# Patient Record
Sex: Female | Born: 1935 | Race: White | Hispanic: No | Smoking: Never smoker
Health system: Southern US, Community
[De-identification: ages and names within clinical notes are randomized; demographics above are authoritative.]

## PROBLEM LIST (undated history)

## (undated) DIAGNOSIS — I10 Essential (primary) hypertension: Secondary | ICD-10-CM

## (undated) DIAGNOSIS — M5430 Sciatica, unspecified side: Secondary | ICD-10-CM

## (undated) HISTORY — PX: ROTATOR CUFF REPAIR: SHX139

---

## 2015-02-06 DIAGNOSIS — Z7189 Other specified counseling: Secondary | ICD-10-CM | POA: Diagnosis not present

## 2015-02-06 DIAGNOSIS — E780 Pure hypercholesterolemia: Secondary | ICD-10-CM | POA: Diagnosis not present

## 2015-02-06 DIAGNOSIS — H9190 Unspecified hearing loss, unspecified ear: Secondary | ICD-10-CM | POA: Diagnosis not present

## 2015-02-06 DIAGNOSIS — N3281 Overactive bladder: Secondary | ICD-10-CM | POA: Diagnosis not present

## 2015-02-06 DIAGNOSIS — M1990 Unspecified osteoarthritis, unspecified site: Secondary | ICD-10-CM | POA: Diagnosis not present

## 2015-02-06 DIAGNOSIS — I10 Essential (primary) hypertension: Secondary | ICD-10-CM | POA: Diagnosis not present

## 2015-02-06 DIAGNOSIS — Z6824 Body mass index (BMI) 24.0-24.9, adult: Secondary | ICD-10-CM | POA: Diagnosis not present

## 2015-02-21 DIAGNOSIS — E039 Hypothyroidism, unspecified: Secondary | ICD-10-CM | POA: Diagnosis not present

## 2015-02-21 DIAGNOSIS — D649 Anemia, unspecified: Secondary | ICD-10-CM | POA: Diagnosis not present

## 2015-02-21 DIAGNOSIS — N390 Urinary tract infection, site not specified: Secondary | ICD-10-CM | POA: Diagnosis not present

## 2015-02-21 DIAGNOSIS — E780 Pure hypercholesterolemia: Secondary | ICD-10-CM | POA: Diagnosis not present

## 2015-03-01 DIAGNOSIS — I10 Essential (primary) hypertension: Secondary | ICD-10-CM | POA: Diagnosis not present

## 2015-03-01 DIAGNOSIS — M1990 Unspecified osteoarthritis, unspecified site: Secondary | ICD-10-CM | POA: Diagnosis not present

## 2015-03-01 DIAGNOSIS — Z6824 Body mass index (BMI) 24.0-24.9, adult: Secondary | ICD-10-CM | POA: Diagnosis not present

## 2015-03-01 DIAGNOSIS — J449 Chronic obstructive pulmonary disease, unspecified: Secondary | ICD-10-CM | POA: Diagnosis not present

## 2015-03-01 DIAGNOSIS — N3281 Overactive bladder: Secondary | ICD-10-CM | POA: Diagnosis not present

## 2015-03-01 DIAGNOSIS — H9190 Unspecified hearing loss, unspecified ear: Secondary | ICD-10-CM | POA: Diagnosis not present

## 2015-03-01 DIAGNOSIS — J45990 Exercise induced bronchospasm: Secondary | ICD-10-CM | POA: Diagnosis not present

## 2015-03-02 DIAGNOSIS — H04129 Dry eye syndrome of unspecified lacrimal gland: Secondary | ICD-10-CM | POA: Diagnosis not present

## 2015-03-02 DIAGNOSIS — Z961 Presence of intraocular lens: Secondary | ICD-10-CM | POA: Diagnosis not present

## 2015-03-02 DIAGNOSIS — H43819 Vitreous degeneration, unspecified eye: Secondary | ICD-10-CM | POA: Diagnosis not present

## 2015-05-31 DIAGNOSIS — N3281 Overactive bladder: Secondary | ICD-10-CM | POA: Diagnosis not present

## 2015-05-31 DIAGNOSIS — I10 Essential (primary) hypertension: Secondary | ICD-10-CM | POA: Diagnosis not present

## 2015-05-31 DIAGNOSIS — E780 Pure hypercholesterolemia: Secondary | ICD-10-CM | POA: Diagnosis not present

## 2015-05-31 DIAGNOSIS — M1990 Unspecified osteoarthritis, unspecified site: Secondary | ICD-10-CM | POA: Diagnosis not present

## 2015-05-31 DIAGNOSIS — Z681 Body mass index (BMI) 19 or less, adult: Secondary | ICD-10-CM | POA: Diagnosis not present

## 2015-06-27 DIAGNOSIS — Z85828 Personal history of other malignant neoplasm of skin: Secondary | ICD-10-CM | POA: Diagnosis not present

## 2015-06-27 DIAGNOSIS — L579 Skin changes due to chronic exposure to nonionizing radiation, unspecified: Secondary | ICD-10-CM | POA: Diagnosis not present

## 2015-06-28 DIAGNOSIS — M1990 Unspecified osteoarthritis, unspecified site: Secondary | ICD-10-CM | POA: Diagnosis not present

## 2015-06-28 DIAGNOSIS — M5416 Radiculopathy, lumbar region: Secondary | ICD-10-CM | POA: Diagnosis not present

## 2015-06-28 DIAGNOSIS — N3281 Overactive bladder: Secondary | ICD-10-CM | POA: Diagnosis not present

## 2015-06-28 DIAGNOSIS — H9190 Unspecified hearing loss, unspecified ear: Secondary | ICD-10-CM | POA: Diagnosis not present

## 2015-06-28 DIAGNOSIS — Z6824 Body mass index (BMI) 24.0-24.9, adult: Secondary | ICD-10-CM | POA: Diagnosis not present

## 2015-06-28 DIAGNOSIS — I10 Essential (primary) hypertension: Secondary | ICD-10-CM | POA: Diagnosis not present

## 2015-06-28 DIAGNOSIS — E780 Pure hypercholesterolemia: Secondary | ICD-10-CM | POA: Diagnosis not present

## 2015-06-28 DIAGNOSIS — J449 Chronic obstructive pulmonary disease, unspecified: Secondary | ICD-10-CM | POA: Diagnosis not present

## 2015-09-06 DIAGNOSIS — H43819 Vitreous degeneration, unspecified eye: Secondary | ICD-10-CM | POA: Diagnosis not present

## 2015-09-06 DIAGNOSIS — H3531 Nonexudative age-related macular degeneration: Secondary | ICD-10-CM | POA: Diagnosis not present

## 2015-09-06 DIAGNOSIS — H02403 Unspecified ptosis of bilateral eyelids: Secondary | ICD-10-CM | POA: Diagnosis not present

## 2015-09-06 DIAGNOSIS — H04202 Unspecified epiphora, left lacrimal gland: Secondary | ICD-10-CM | POA: Diagnosis not present

## 2015-10-06 DIAGNOSIS — K5900 Constipation, unspecified: Secondary | ICD-10-CM | POA: Diagnosis not present

## 2015-10-11 DIAGNOSIS — H353130 Nonexudative age-related macular degeneration, bilateral, stage unspecified: Secondary | ICD-10-CM | POA: Diagnosis not present

## 2015-10-11 DIAGNOSIS — H04123 Dry eye syndrome of bilateral lacrimal glands: Secondary | ICD-10-CM | POA: Diagnosis not present

## 2015-10-11 DIAGNOSIS — H43813 Vitreous degeneration, bilateral: Secondary | ICD-10-CM | POA: Diagnosis not present

## 2015-10-11 DIAGNOSIS — H10503 Unspecified blepharoconjunctivitis, bilateral: Secondary | ICD-10-CM | POA: Diagnosis not present

## 2015-10-16 DIAGNOSIS — R42 Dizziness and giddiness: Secondary | ICD-10-CM | POA: Diagnosis not present

## 2015-10-26 DIAGNOSIS — H0011 Chalazion right upper eyelid: Secondary | ICD-10-CM | POA: Diagnosis not present

## 2015-10-26 DIAGNOSIS — H1851 Endothelial corneal dystrophy: Secondary | ICD-10-CM | POA: Diagnosis not present

## 2015-10-26 DIAGNOSIS — H10503 Unspecified blepharoconjunctivitis, bilateral: Secondary | ICD-10-CM | POA: Diagnosis not present

## 2015-10-26 DIAGNOSIS — H34211 Partial retinal artery occlusion, right eye: Secondary | ICD-10-CM | POA: Diagnosis not present

## 2015-11-01 DIAGNOSIS — I10 Essential (primary) hypertension: Secondary | ICD-10-CM | POA: Diagnosis not present

## 2015-11-01 DIAGNOSIS — Z6824 Body mass index (BMI) 24.0-24.9, adult: Secondary | ICD-10-CM | POA: Diagnosis not present

## 2015-11-01 DIAGNOSIS — H9190 Unspecified hearing loss, unspecified ear: Secondary | ICD-10-CM | POA: Diagnosis not present

## 2015-11-01 DIAGNOSIS — M5416 Radiculopathy, lumbar region: Secondary | ICD-10-CM | POA: Diagnosis not present

## 2015-11-01 DIAGNOSIS — N3281 Overactive bladder: Secondary | ICD-10-CM | POA: Diagnosis not present

## 2015-11-01 DIAGNOSIS — Z23 Encounter for immunization: Secondary | ICD-10-CM | POA: Diagnosis not present

## 2015-11-01 DIAGNOSIS — E7800 Pure hypercholesterolemia, unspecified: Secondary | ICD-10-CM | POA: Diagnosis not present

## 2015-11-01 DIAGNOSIS — J449 Chronic obstructive pulmonary disease, unspecified: Secondary | ICD-10-CM | POA: Diagnosis not present

## 2015-11-01 DIAGNOSIS — M1990 Unspecified osteoarthritis, unspecified site: Secondary | ICD-10-CM | POA: Diagnosis not present

## 2015-11-13 DIAGNOSIS — H0011 Chalazion right upper eyelid: Secondary | ICD-10-CM | POA: Diagnosis not present

## 2015-12-22 DIAGNOSIS — E7800 Pure hypercholesterolemia, unspecified: Secondary | ICD-10-CM | POA: Diagnosis not present

## 2015-12-22 DIAGNOSIS — H9190 Unspecified hearing loss, unspecified ear: Secondary | ICD-10-CM | POA: Diagnosis not present

## 2015-12-22 DIAGNOSIS — N3281 Overactive bladder: Secondary | ICD-10-CM | POA: Diagnosis not present

## 2015-12-22 DIAGNOSIS — Z6824 Body mass index (BMI) 24.0-24.9, adult: Secondary | ICD-10-CM | POA: Diagnosis not present

## 2015-12-22 DIAGNOSIS — J449 Chronic obstructive pulmonary disease, unspecified: Secondary | ICD-10-CM | POA: Diagnosis not present

## 2015-12-22 DIAGNOSIS — M5416 Radiculopathy, lumbar region: Secondary | ICD-10-CM | POA: Diagnosis not present

## 2015-12-22 DIAGNOSIS — I10 Essential (primary) hypertension: Secondary | ICD-10-CM | POA: Diagnosis not present

## 2015-12-22 DIAGNOSIS — M1990 Unspecified osteoarthritis, unspecified site: Secondary | ICD-10-CM | POA: Diagnosis not present

## 2015-12-29 DIAGNOSIS — M1990 Unspecified osteoarthritis, unspecified site: Secondary | ICD-10-CM | POA: Diagnosis not present

## 2015-12-29 DIAGNOSIS — N3281 Overactive bladder: Secondary | ICD-10-CM | POA: Diagnosis not present

## 2015-12-29 DIAGNOSIS — Z6824 Body mass index (BMI) 24.0-24.9, adult: Secondary | ICD-10-CM | POA: Diagnosis not present

## 2015-12-29 DIAGNOSIS — E7800 Pure hypercholesterolemia, unspecified: Secondary | ICD-10-CM | POA: Diagnosis not present

## 2015-12-29 DIAGNOSIS — I10 Essential (primary) hypertension: Secondary | ICD-10-CM | POA: Diagnosis not present

## 2015-12-29 DIAGNOSIS — H9190 Unspecified hearing loss, unspecified ear: Secondary | ICD-10-CM | POA: Diagnosis not present

## 2015-12-29 DIAGNOSIS — J449 Chronic obstructive pulmonary disease, unspecified: Secondary | ICD-10-CM | POA: Diagnosis not present

## 2015-12-29 DIAGNOSIS — M5416 Radiculopathy, lumbar region: Secondary | ICD-10-CM | POA: Diagnosis not present

## 2016-01-01 DIAGNOSIS — M47896 Other spondylosis, lumbar region: Secondary | ICD-10-CM | POA: Diagnosis not present

## 2016-01-01 DIAGNOSIS — M545 Low back pain: Secondary | ICD-10-CM | POA: Diagnosis not present

## 2016-01-07 DIAGNOSIS — K5730 Diverticulosis of large intestine without perforation or abscess without bleeding: Secondary | ICD-10-CM | POA: Diagnosis not present

## 2016-01-07 DIAGNOSIS — K922 Gastrointestinal hemorrhage, unspecified: Secondary | ICD-10-CM | POA: Diagnosis not present

## 2016-01-07 DIAGNOSIS — K625 Hemorrhage of anus and rectum: Secondary | ICD-10-CM | POA: Diagnosis not present

## 2016-01-07 DIAGNOSIS — R030 Elevated blood-pressure reading, without diagnosis of hypertension: Secondary | ICD-10-CM | POA: Diagnosis not present

## 2016-01-08 DIAGNOSIS — K922 Gastrointestinal hemorrhage, unspecified: Secondary | ICD-10-CM | POA: Diagnosis not present

## 2016-01-08 DIAGNOSIS — K625 Hemorrhage of anus and rectum: Secondary | ICD-10-CM | POA: Diagnosis not present

## 2016-01-08 DIAGNOSIS — D62 Acute posthemorrhagic anemia: Secondary | ICD-10-CM | POA: Diagnosis not present

## 2016-01-08 DIAGNOSIS — M4646 Discitis, unspecified, lumbar region: Secondary | ICD-10-CM | POA: Diagnosis not present

## 2016-01-08 DIAGNOSIS — R52 Pain, unspecified: Secondary | ICD-10-CM | POA: Diagnosis not present

## 2016-01-09 DIAGNOSIS — M4646 Discitis, unspecified, lumbar region: Secondary | ICD-10-CM | POA: Diagnosis not present

## 2016-01-09 DIAGNOSIS — D62 Acute posthemorrhagic anemia: Secondary | ICD-10-CM | POA: Diagnosis not present

## 2016-01-09 DIAGNOSIS — R52 Pain, unspecified: Secondary | ICD-10-CM | POA: Diagnosis not present

## 2016-01-09 DIAGNOSIS — D126 Benign neoplasm of colon, unspecified: Secondary | ICD-10-CM | POA: Diagnosis not present

## 2016-01-09 DIAGNOSIS — K5790 Diverticulosis of intestine, part unspecified, without perforation or abscess without bleeding: Secondary | ICD-10-CM | POA: Diagnosis not present

## 2016-01-09 DIAGNOSIS — K625 Hemorrhage of anus and rectum: Secondary | ICD-10-CM | POA: Diagnosis not present

## 2016-01-09 DIAGNOSIS — K922 Gastrointestinal hemorrhage, unspecified: Secondary | ICD-10-CM | POA: Diagnosis not present

## 2016-01-10 DIAGNOSIS — K625 Hemorrhage of anus and rectum: Secondary | ICD-10-CM | POA: Diagnosis not present

## 2016-01-10 DIAGNOSIS — R52 Pain, unspecified: Secondary | ICD-10-CM | POA: Diagnosis not present

## 2016-01-10 DIAGNOSIS — D62 Acute posthemorrhagic anemia: Secondary | ICD-10-CM | POA: Diagnosis not present

## 2016-01-10 DIAGNOSIS — M4806 Spinal stenosis, lumbar region: Secondary | ICD-10-CM | POA: Diagnosis not present

## 2016-01-10 DIAGNOSIS — D126 Benign neoplasm of colon, unspecified: Secondary | ICD-10-CM | POA: Diagnosis not present

## 2016-01-10 DIAGNOSIS — K922 Gastrointestinal hemorrhage, unspecified: Secondary | ICD-10-CM | POA: Diagnosis not present

## 2016-01-10 DIAGNOSIS — M5126 Other intervertebral disc displacement, lumbar region: Secondary | ICD-10-CM | POA: Diagnosis not present

## 2016-01-10 DIAGNOSIS — M47816 Spondylosis without myelopathy or radiculopathy, lumbar region: Secondary | ICD-10-CM | POA: Diagnosis not present

## 2016-01-10 DIAGNOSIS — M4646 Discitis, unspecified, lumbar region: Secondary | ICD-10-CM | POA: Diagnosis not present

## 2016-01-10 DIAGNOSIS — K5790 Diverticulosis of intestine, part unspecified, without perforation or abscess without bleeding: Secondary | ICD-10-CM | POA: Diagnosis not present

## 2016-01-11 DIAGNOSIS — R52 Pain, unspecified: Secondary | ICD-10-CM | POA: Diagnosis not present

## 2016-01-11 DIAGNOSIS — K5790 Diverticulosis of intestine, part unspecified, without perforation or abscess without bleeding: Secondary | ICD-10-CM | POA: Diagnosis not present

## 2016-01-11 DIAGNOSIS — D126 Benign neoplasm of colon, unspecified: Secondary | ICD-10-CM | POA: Diagnosis not present

## 2016-01-11 DIAGNOSIS — M4646 Discitis, unspecified, lumbar region: Secondary | ICD-10-CM | POA: Diagnosis not present

## 2016-01-11 DIAGNOSIS — K922 Gastrointestinal hemorrhage, unspecified: Secondary | ICD-10-CM | POA: Diagnosis not present

## 2016-01-11 DIAGNOSIS — D62 Acute posthemorrhagic anemia: Secondary | ICD-10-CM | POA: Diagnosis not present

## 2016-01-11 DIAGNOSIS — K625 Hemorrhage of anus and rectum: Secondary | ICD-10-CM | POA: Diagnosis not present

## 2016-01-12 DIAGNOSIS — R52 Pain, unspecified: Secondary | ICD-10-CM | POA: Diagnosis not present

## 2016-01-12 DIAGNOSIS — K922 Gastrointestinal hemorrhage, unspecified: Secondary | ICD-10-CM | POA: Diagnosis not present

## 2016-01-12 DIAGNOSIS — D62 Acute posthemorrhagic anemia: Secondary | ICD-10-CM | POA: Diagnosis not present

## 2016-01-12 DIAGNOSIS — K625 Hemorrhage of anus and rectum: Secondary | ICD-10-CM | POA: Diagnosis not present

## 2016-01-12 DIAGNOSIS — D126 Benign neoplasm of colon, unspecified: Secondary | ICD-10-CM | POA: Diagnosis not present

## 2016-01-12 DIAGNOSIS — K5790 Diverticulosis of intestine, part unspecified, without perforation or abscess without bleeding: Secondary | ICD-10-CM | POA: Diagnosis not present

## 2016-01-12 DIAGNOSIS — M4646 Discitis, unspecified, lumbar region: Secondary | ICD-10-CM | POA: Diagnosis not present

## 2016-01-12 DIAGNOSIS — K5100 Ulcerative (chronic) pancolitis without complications: Secondary | ICD-10-CM | POA: Diagnosis not present

## 2016-01-13 DIAGNOSIS — M4646 Discitis, unspecified, lumbar region: Secondary | ICD-10-CM | POA: Diagnosis not present

## 2016-01-13 DIAGNOSIS — K922 Gastrointestinal hemorrhage, unspecified: Secondary | ICD-10-CM | POA: Diagnosis not present

## 2016-01-13 DIAGNOSIS — R52 Pain, unspecified: Secondary | ICD-10-CM | POA: Diagnosis not present

## 2016-01-13 DIAGNOSIS — D62 Acute posthemorrhagic anemia: Secondary | ICD-10-CM | POA: Diagnosis not present

## 2016-01-14 DIAGNOSIS — M4646 Discitis, unspecified, lumbar region: Secondary | ICD-10-CM | POA: Diagnosis not present

## 2016-01-14 DIAGNOSIS — D649 Anemia, unspecified: Secondary | ICD-10-CM | POA: Diagnosis not present

## 2016-01-14 DIAGNOSIS — M62512 Muscle wasting and atrophy, not elsewhere classified, left shoulder: Secondary | ICD-10-CM | POA: Diagnosis not present

## 2016-01-14 DIAGNOSIS — M8448XA Pathological fracture, other site, initial encounter for fracture: Secondary | ICD-10-CM | POA: Diagnosis not present

## 2016-01-14 DIAGNOSIS — Z741 Need for assistance with personal care: Secondary | ICD-10-CM | POA: Diagnosis not present

## 2016-01-14 DIAGNOSIS — M869 Osteomyelitis, unspecified: Secondary | ICD-10-CM | POA: Diagnosis not present

## 2016-01-14 DIAGNOSIS — M62541 Muscle wasting and atrophy, not elsewhere classified, right hand: Secondary | ICD-10-CM | POA: Diagnosis not present

## 2016-01-14 DIAGNOSIS — M4620 Osteomyelitis of vertebra, site unspecified: Secondary | ICD-10-CM | POA: Diagnosis not present

## 2016-01-14 DIAGNOSIS — M479 Spondylosis, unspecified: Secondary | ICD-10-CM | POA: Diagnosis not present

## 2016-01-14 DIAGNOSIS — M62522 Muscle wasting and atrophy, not elsewhere classified, left upper arm: Secondary | ICD-10-CM | POA: Diagnosis not present

## 2016-01-14 DIAGNOSIS — S32059A Unspecified fracture of fifth lumbar vertebra, initial encounter for closed fracture: Secondary | ICD-10-CM | POA: Diagnosis not present

## 2016-01-14 DIAGNOSIS — M545 Low back pain: Secondary | ICD-10-CM | POA: Diagnosis not present

## 2016-01-14 DIAGNOSIS — K219 Gastro-esophageal reflux disease without esophagitis: Secondary | ICD-10-CM | POA: Diagnosis not present

## 2016-01-14 DIAGNOSIS — M62542 Muscle wasting and atrophy, not elsewhere classified, left hand: Secondary | ICD-10-CM | POA: Diagnosis not present

## 2016-01-14 DIAGNOSIS — M62531 Muscle wasting and atrophy, not elsewhere classified, right forearm: Secondary | ICD-10-CM | POA: Diagnosis not present

## 2016-01-14 DIAGNOSIS — M62521 Muscle wasting and atrophy, not elsewhere classified, right upper arm: Secondary | ICD-10-CM | POA: Diagnosis not present

## 2016-01-14 DIAGNOSIS — M62511 Muscle wasting and atrophy, not elsewhere classified, right shoulder: Secondary | ICD-10-CM | POA: Diagnosis not present

## 2016-01-14 DIAGNOSIS — K922 Gastrointestinal hemorrhage, unspecified: Secondary | ICD-10-CM | POA: Diagnosis not present

## 2016-01-14 DIAGNOSIS — M62532 Muscle wasting and atrophy, not elsewhere classified, left forearm: Secondary | ICD-10-CM | POA: Diagnosis not present

## 2016-01-14 DIAGNOSIS — M1990 Unspecified osteoarthritis, unspecified site: Secondary | ICD-10-CM | POA: Diagnosis not present

## 2016-01-14 DIAGNOSIS — M5416 Radiculopathy, lumbar region: Secondary | ICD-10-CM | POA: Diagnosis not present

## 2016-01-14 DIAGNOSIS — D62 Acute posthemorrhagic anemia: Secondary | ICD-10-CM | POA: Diagnosis not present

## 2016-01-14 DIAGNOSIS — R531 Weakness: Secondary | ICD-10-CM | POA: Diagnosis not present

## 2016-01-14 DIAGNOSIS — R2681 Unsteadiness on feet: Secondary | ICD-10-CM | POA: Diagnosis not present

## 2016-01-14 DIAGNOSIS — R52 Pain, unspecified: Secondary | ICD-10-CM | POA: Diagnosis not present

## 2016-01-14 DIAGNOSIS — R262 Difficulty in walking, not elsewhere classified: Secondary | ICD-10-CM | POA: Diagnosis not present

## 2016-01-14 DIAGNOSIS — I10 Essential (primary) hypertension: Secondary | ICD-10-CM | POA: Diagnosis not present

## 2016-01-15 DIAGNOSIS — M869 Osteomyelitis, unspecified: Secondary | ICD-10-CM | POA: Diagnosis not present

## 2016-01-15 DIAGNOSIS — R2681 Unsteadiness on feet: Secondary | ICD-10-CM | POA: Diagnosis not present

## 2016-01-15 DIAGNOSIS — R531 Weakness: Secondary | ICD-10-CM | POA: Diagnosis not present

## 2016-01-15 DIAGNOSIS — K219 Gastro-esophageal reflux disease without esophagitis: Secondary | ICD-10-CM | POA: Diagnosis not present

## 2016-01-19 DIAGNOSIS — K922 Gastrointestinal hemorrhage, unspecified: Secondary | ICD-10-CM | POA: Diagnosis not present

## 2016-01-19 DIAGNOSIS — D649 Anemia, unspecified: Secondary | ICD-10-CM | POA: Diagnosis not present

## 2016-01-19 DIAGNOSIS — M1990 Unspecified osteoarthritis, unspecified site: Secondary | ICD-10-CM | POA: Diagnosis not present

## 2016-01-19 DIAGNOSIS — I10 Essential (primary) hypertension: Secondary | ICD-10-CM | POA: Diagnosis not present

## 2016-02-06 DIAGNOSIS — M545 Low back pain: Secondary | ICD-10-CM | POA: Diagnosis not present

## 2016-02-06 DIAGNOSIS — D649 Anemia, unspecified: Secondary | ICD-10-CM | POA: Diagnosis not present

## 2016-02-06 DIAGNOSIS — M4620 Osteomyelitis of vertebra, site unspecified: Secondary | ICD-10-CM | POA: Diagnosis not present

## 2016-02-06 DIAGNOSIS — M1990 Unspecified osteoarthritis, unspecified site: Secondary | ICD-10-CM | POA: Diagnosis not present

## 2016-02-06 DIAGNOSIS — M8448XA Pathological fracture, other site, initial encounter for fracture: Secondary | ICD-10-CM | POA: Diagnosis not present

## 2016-02-06 DIAGNOSIS — K922 Gastrointestinal hemorrhage, unspecified: Secondary | ICD-10-CM | POA: Diagnosis not present

## 2016-02-06 DIAGNOSIS — M5416 Radiculopathy, lumbar region: Secondary | ICD-10-CM | POA: Diagnosis not present

## 2016-02-06 DIAGNOSIS — I10 Essential (primary) hypertension: Secondary | ICD-10-CM | POA: Diagnosis not present

## 2016-02-08 DIAGNOSIS — M479 Spondylosis, unspecified: Secondary | ICD-10-CM | POA: Diagnosis not present

## 2016-02-08 DIAGNOSIS — R2681 Unsteadiness on feet: Secondary | ICD-10-CM | POA: Diagnosis not present

## 2016-02-08 DIAGNOSIS — S32059A Unspecified fracture of fifth lumbar vertebra, initial encounter for closed fracture: Secondary | ICD-10-CM | POA: Diagnosis not present

## 2016-02-08 DIAGNOSIS — R531 Weakness: Secondary | ICD-10-CM | POA: Diagnosis not present

## 2016-02-13 DIAGNOSIS — S32059A Unspecified fracture of fifth lumbar vertebra, initial encounter for closed fracture: Secondary | ICD-10-CM | POA: Diagnosis not present

## 2016-02-13 DIAGNOSIS — M479 Spondylosis, unspecified: Secondary | ICD-10-CM | POA: Diagnosis not present

## 2016-02-13 DIAGNOSIS — R531 Weakness: Secondary | ICD-10-CM | POA: Diagnosis not present

## 2016-02-14 DIAGNOSIS — R2681 Unsteadiness on feet: Secondary | ICD-10-CM | POA: Diagnosis not present

## 2016-02-14 DIAGNOSIS — M419 Scoliosis, unspecified: Secondary | ICD-10-CM | POA: Diagnosis not present

## 2016-02-14 DIAGNOSIS — G8929 Other chronic pain: Secondary | ICD-10-CM | POA: Diagnosis not present

## 2016-02-14 DIAGNOSIS — D62 Acute posthemorrhagic anemia: Secondary | ICD-10-CM | POA: Diagnosis not present

## 2016-02-14 DIAGNOSIS — S32050D Wedge compression fracture of fifth lumbar vertebra, subsequent encounter for fracture with routine healing: Secondary | ICD-10-CM | POA: Diagnosis not present

## 2016-02-14 DIAGNOSIS — M549 Dorsalgia, unspecified: Secondary | ICD-10-CM | POA: Diagnosis not present

## 2016-02-20 DIAGNOSIS — M549 Dorsalgia, unspecified: Secondary | ICD-10-CM | POA: Diagnosis not present

## 2016-02-20 DIAGNOSIS — S32050D Wedge compression fracture of fifth lumbar vertebra, subsequent encounter for fracture with routine healing: Secondary | ICD-10-CM | POA: Diagnosis not present

## 2016-02-20 DIAGNOSIS — R2681 Unsteadiness on feet: Secondary | ICD-10-CM | POA: Diagnosis not present

## 2016-02-20 DIAGNOSIS — M419 Scoliosis, unspecified: Secondary | ICD-10-CM | POA: Diagnosis not present

## 2016-02-20 DIAGNOSIS — G8929 Other chronic pain: Secondary | ICD-10-CM | POA: Diagnosis not present

## 2016-02-20 DIAGNOSIS — D62 Acute posthemorrhagic anemia: Secondary | ICD-10-CM | POA: Diagnosis not present

## 2016-02-21 DIAGNOSIS — D62 Acute posthemorrhagic anemia: Secondary | ICD-10-CM | POA: Diagnosis not present

## 2016-02-21 DIAGNOSIS — R2681 Unsteadiness on feet: Secondary | ICD-10-CM | POA: Diagnosis not present

## 2016-02-21 DIAGNOSIS — M419 Scoliosis, unspecified: Secondary | ICD-10-CM | POA: Diagnosis not present

## 2016-02-21 DIAGNOSIS — G8929 Other chronic pain: Secondary | ICD-10-CM | POA: Diagnosis not present

## 2016-02-21 DIAGNOSIS — S32050D Wedge compression fracture of fifth lumbar vertebra, subsequent encounter for fracture with routine healing: Secondary | ICD-10-CM | POA: Diagnosis not present

## 2016-02-21 DIAGNOSIS — M549 Dorsalgia, unspecified: Secondary | ICD-10-CM | POA: Diagnosis not present

## 2016-02-22 DIAGNOSIS — G8929 Other chronic pain: Secondary | ICD-10-CM | POA: Diagnosis not present

## 2016-02-22 DIAGNOSIS — M549 Dorsalgia, unspecified: Secondary | ICD-10-CM | POA: Diagnosis not present

## 2016-02-22 DIAGNOSIS — D62 Acute posthemorrhagic anemia: Secondary | ICD-10-CM | POA: Diagnosis not present

## 2016-02-22 DIAGNOSIS — R2681 Unsteadiness on feet: Secondary | ICD-10-CM | POA: Diagnosis not present

## 2016-02-22 DIAGNOSIS — S32050D Wedge compression fracture of fifth lumbar vertebra, subsequent encounter for fracture with routine healing: Secondary | ICD-10-CM | POA: Diagnosis not present

## 2016-02-22 DIAGNOSIS — M419 Scoliosis, unspecified: Secondary | ICD-10-CM | POA: Diagnosis not present

## 2016-02-23 DIAGNOSIS — R2681 Unsteadiness on feet: Secondary | ICD-10-CM | POA: Diagnosis not present

## 2016-02-23 DIAGNOSIS — G8929 Other chronic pain: Secondary | ICD-10-CM | POA: Diagnosis not present

## 2016-02-23 DIAGNOSIS — M549 Dorsalgia, unspecified: Secondary | ICD-10-CM | POA: Diagnosis not present

## 2016-02-23 DIAGNOSIS — S32050D Wedge compression fracture of fifth lumbar vertebra, subsequent encounter for fracture with routine healing: Secondary | ICD-10-CM | POA: Diagnosis not present

## 2016-02-23 DIAGNOSIS — M419 Scoliosis, unspecified: Secondary | ICD-10-CM | POA: Diagnosis not present

## 2016-02-23 DIAGNOSIS — D62 Acute posthemorrhagic anemia: Secondary | ICD-10-CM | POA: Diagnosis not present

## 2016-02-27 DIAGNOSIS — E7800 Pure hypercholesterolemia, unspecified: Secondary | ICD-10-CM | POA: Diagnosis not present

## 2016-02-27 DIAGNOSIS — D649 Anemia, unspecified: Secondary | ICD-10-CM | POA: Diagnosis not present

## 2016-02-27 DIAGNOSIS — D62 Acute posthemorrhagic anemia: Secondary | ICD-10-CM | POA: Diagnosis not present

## 2016-02-27 DIAGNOSIS — R42 Dizziness and giddiness: Secondary | ICD-10-CM | POA: Diagnosis not present

## 2016-02-27 DIAGNOSIS — E039 Hypothyroidism, unspecified: Secondary | ICD-10-CM | POA: Diagnosis not present

## 2016-02-27 DIAGNOSIS — M419 Scoliosis, unspecified: Secondary | ICD-10-CM | POA: Diagnosis not present

## 2016-02-27 DIAGNOSIS — I10 Essential (primary) hypertension: Secondary | ICD-10-CM | POA: Diagnosis not present

## 2016-02-27 DIAGNOSIS — N289 Disorder of kidney and ureter, unspecified: Secondary | ICD-10-CM | POA: Diagnosis not present

## 2016-02-27 DIAGNOSIS — S32050D Wedge compression fracture of fifth lumbar vertebra, subsequent encounter for fracture with routine healing: Secondary | ICD-10-CM | POA: Diagnosis not present

## 2016-02-27 DIAGNOSIS — G8929 Other chronic pain: Secondary | ICD-10-CM | POA: Diagnosis not present

## 2016-02-27 DIAGNOSIS — M549 Dorsalgia, unspecified: Secondary | ICD-10-CM | POA: Diagnosis not present

## 2016-02-27 DIAGNOSIS — R2681 Unsteadiness on feet: Secondary | ICD-10-CM | POA: Diagnosis not present

## 2016-02-28 DIAGNOSIS — M419 Scoliosis, unspecified: Secondary | ICD-10-CM | POA: Diagnosis not present

## 2016-02-28 DIAGNOSIS — G8929 Other chronic pain: Secondary | ICD-10-CM | POA: Diagnosis not present

## 2016-02-28 DIAGNOSIS — S32050D Wedge compression fracture of fifth lumbar vertebra, subsequent encounter for fracture with routine healing: Secondary | ICD-10-CM | POA: Diagnosis not present

## 2016-02-28 DIAGNOSIS — M549 Dorsalgia, unspecified: Secondary | ICD-10-CM | POA: Diagnosis not present

## 2016-02-28 DIAGNOSIS — D62 Acute posthemorrhagic anemia: Secondary | ICD-10-CM | POA: Diagnosis not present

## 2016-02-28 DIAGNOSIS — R2681 Unsteadiness on feet: Secondary | ICD-10-CM | POA: Diagnosis not present

## 2016-02-29 DIAGNOSIS — G8929 Other chronic pain: Secondary | ICD-10-CM | POA: Diagnosis not present

## 2016-02-29 DIAGNOSIS — R2681 Unsteadiness on feet: Secondary | ICD-10-CM | POA: Diagnosis not present

## 2016-02-29 DIAGNOSIS — D62 Acute posthemorrhagic anemia: Secondary | ICD-10-CM | POA: Diagnosis not present

## 2016-02-29 DIAGNOSIS — M549 Dorsalgia, unspecified: Secondary | ICD-10-CM | POA: Diagnosis not present

## 2016-02-29 DIAGNOSIS — S32050D Wedge compression fracture of fifth lumbar vertebra, subsequent encounter for fracture with routine healing: Secondary | ICD-10-CM | POA: Diagnosis not present

## 2016-02-29 DIAGNOSIS — M419 Scoliosis, unspecified: Secondary | ICD-10-CM | POA: Diagnosis not present

## 2016-03-01 DIAGNOSIS — R2681 Unsteadiness on feet: Secondary | ICD-10-CM | POA: Diagnosis not present

## 2016-03-01 DIAGNOSIS — G8929 Other chronic pain: Secondary | ICD-10-CM | POA: Diagnosis not present

## 2016-03-01 DIAGNOSIS — M549 Dorsalgia, unspecified: Secondary | ICD-10-CM | POA: Diagnosis not present

## 2016-03-01 DIAGNOSIS — M419 Scoliosis, unspecified: Secondary | ICD-10-CM | POA: Diagnosis not present

## 2016-03-01 DIAGNOSIS — D62 Acute posthemorrhagic anemia: Secondary | ICD-10-CM | POA: Diagnosis not present

## 2016-03-01 DIAGNOSIS — S32050D Wedge compression fracture of fifth lumbar vertebra, subsequent encounter for fracture with routine healing: Secondary | ICD-10-CM | POA: Diagnosis not present

## 2016-03-04 DIAGNOSIS — I10 Essential (primary) hypertension: Secondary | ICD-10-CM | POA: Diagnosis not present

## 2016-03-04 DIAGNOSIS — M329 Systemic lupus erythematosus, unspecified: Secondary | ICD-10-CM | POA: Diagnosis not present

## 2016-03-04 DIAGNOSIS — M1990 Unspecified osteoarthritis, unspecified site: Secondary | ICD-10-CM | POA: Diagnosis not present

## 2016-03-04 DIAGNOSIS — N3281 Overactive bladder: Secondary | ICD-10-CM | POA: Diagnosis not present

## 2016-03-04 DIAGNOSIS — M5416 Radiculopathy, lumbar region: Secondary | ICD-10-CM | POA: Diagnosis not present

## 2016-03-04 DIAGNOSIS — J449 Chronic obstructive pulmonary disease, unspecified: Secondary | ICD-10-CM | POA: Diagnosis not present

## 2016-03-04 DIAGNOSIS — E7800 Pure hypercholesterolemia, unspecified: Secondary | ICD-10-CM | POA: Diagnosis not present

## 2016-03-04 DIAGNOSIS — K769 Liver disease, unspecified: Secondary | ICD-10-CM | POA: Diagnosis not present

## 2016-03-04 DIAGNOSIS — Z6823 Body mass index (BMI) 23.0-23.9, adult: Secondary | ICD-10-CM | POA: Diagnosis not present

## 2016-03-04 DIAGNOSIS — H9190 Unspecified hearing loss, unspecified ear: Secondary | ICD-10-CM | POA: Diagnosis not present

## 2016-03-05 DIAGNOSIS — M549 Dorsalgia, unspecified: Secondary | ICD-10-CM | POA: Diagnosis not present

## 2016-03-05 DIAGNOSIS — S32050D Wedge compression fracture of fifth lumbar vertebra, subsequent encounter for fracture with routine healing: Secondary | ICD-10-CM | POA: Diagnosis not present

## 2016-03-05 DIAGNOSIS — D62 Acute posthemorrhagic anemia: Secondary | ICD-10-CM | POA: Diagnosis not present

## 2016-03-05 DIAGNOSIS — G8929 Other chronic pain: Secondary | ICD-10-CM | POA: Diagnosis not present

## 2016-03-05 DIAGNOSIS — M419 Scoliosis, unspecified: Secondary | ICD-10-CM | POA: Diagnosis not present

## 2016-03-05 DIAGNOSIS — R2681 Unsteadiness on feet: Secondary | ICD-10-CM | POA: Diagnosis not present

## 2016-03-06 DIAGNOSIS — M9953 Intervertebral disc stenosis of neural canal of lumbar region: Secondary | ICD-10-CM | POA: Diagnosis not present

## 2016-03-06 DIAGNOSIS — M5416 Radiculopathy, lumbar region: Secondary | ICD-10-CM | POA: Diagnosis not present

## 2016-03-06 DIAGNOSIS — M9983 Other biomechanical lesions of lumbar region: Secondary | ICD-10-CM | POA: Diagnosis not present

## 2016-03-07 DIAGNOSIS — M419 Scoliosis, unspecified: Secondary | ICD-10-CM | POA: Diagnosis not present

## 2016-03-07 DIAGNOSIS — G8929 Other chronic pain: Secondary | ICD-10-CM | POA: Diagnosis not present

## 2016-03-07 DIAGNOSIS — S32050D Wedge compression fracture of fifth lumbar vertebra, subsequent encounter for fracture with routine healing: Secondary | ICD-10-CM | POA: Diagnosis not present

## 2016-03-07 DIAGNOSIS — R2681 Unsteadiness on feet: Secondary | ICD-10-CM | POA: Diagnosis not present

## 2016-03-07 DIAGNOSIS — M549 Dorsalgia, unspecified: Secondary | ICD-10-CM | POA: Diagnosis not present

## 2016-03-07 DIAGNOSIS — D62 Acute posthemorrhagic anemia: Secondary | ICD-10-CM | POA: Diagnosis not present

## 2016-03-08 DIAGNOSIS — S32050D Wedge compression fracture of fifth lumbar vertebra, subsequent encounter for fracture with routine healing: Secondary | ICD-10-CM | POA: Diagnosis not present

## 2016-03-08 DIAGNOSIS — M1990 Unspecified osteoarthritis, unspecified site: Secondary | ICD-10-CM | POA: Diagnosis not present

## 2016-03-08 DIAGNOSIS — J449 Chronic obstructive pulmonary disease, unspecified: Secondary | ICD-10-CM | POA: Diagnosis not present

## 2016-03-08 DIAGNOSIS — E782 Mixed hyperlipidemia: Secondary | ICD-10-CM | POA: Diagnosis not present

## 2016-03-11 DIAGNOSIS — R2681 Unsteadiness on feet: Secondary | ICD-10-CM | POA: Diagnosis not present

## 2016-03-11 DIAGNOSIS — D62 Acute posthemorrhagic anemia: Secondary | ICD-10-CM | POA: Diagnosis not present

## 2016-03-11 DIAGNOSIS — S32050D Wedge compression fracture of fifth lumbar vertebra, subsequent encounter for fracture with routine healing: Secondary | ICD-10-CM | POA: Diagnosis not present

## 2016-03-11 DIAGNOSIS — M549 Dorsalgia, unspecified: Secondary | ICD-10-CM | POA: Diagnosis not present

## 2016-03-11 DIAGNOSIS — M419 Scoliosis, unspecified: Secondary | ICD-10-CM | POA: Diagnosis not present

## 2016-03-11 DIAGNOSIS — G8929 Other chronic pain: Secondary | ICD-10-CM | POA: Diagnosis not present

## 2016-03-13 DIAGNOSIS — M4806 Spinal stenosis, lumbar region: Secondary | ICD-10-CM | POA: Diagnosis not present

## 2016-03-13 DIAGNOSIS — M8448XA Pathological fracture, other site, initial encounter for fracture: Secondary | ICD-10-CM | POA: Diagnosis not present

## 2016-03-13 DIAGNOSIS — M5416 Radiculopathy, lumbar region: Secondary | ICD-10-CM | POA: Diagnosis not present

## 2016-03-14 DIAGNOSIS — M8468XA Pathological fracture in other disease, other site, initial encounter for fracture: Secondary | ICD-10-CM | POA: Diagnosis not present

## 2016-03-14 DIAGNOSIS — M4806 Spinal stenosis, lumbar region: Secondary | ICD-10-CM | POA: Diagnosis not present

## 2016-03-14 DIAGNOSIS — M5416 Radiculopathy, lumbar region: Secondary | ICD-10-CM | POA: Diagnosis not present

## 2016-03-14 DIAGNOSIS — M8448XA Pathological fracture, other site, initial encounter for fracture: Secondary | ICD-10-CM | POA: Diagnosis not present

## 2016-03-14 DIAGNOSIS — M8088XA Other osteoporosis with current pathological fracture, vertebra(e), initial encounter for fracture: Secondary | ICD-10-CM | POA: Diagnosis not present

## 2016-03-14 DIAGNOSIS — Z4789 Encounter for other orthopedic aftercare: Secondary | ICD-10-CM | POA: Diagnosis not present

## 2016-03-15 DIAGNOSIS — M4806 Spinal stenosis, lumbar region: Secondary | ICD-10-CM | POA: Diagnosis not present

## 2016-03-15 DIAGNOSIS — M8088XA Other osteoporosis with current pathological fracture, vertebra(e), initial encounter for fracture: Secondary | ICD-10-CM | POA: Diagnosis not present

## 2016-03-15 DIAGNOSIS — Z4789 Encounter for other orthopedic aftercare: Secondary | ICD-10-CM | POA: Diagnosis not present

## 2016-03-15 DIAGNOSIS — M5416 Radiculopathy, lumbar region: Secondary | ICD-10-CM | POA: Diagnosis not present

## 2016-03-18 DIAGNOSIS — M5416 Radiculopathy, lumbar region: Secondary | ICD-10-CM | POA: Diagnosis not present

## 2016-03-18 DIAGNOSIS — M4806 Spinal stenosis, lumbar region: Secondary | ICD-10-CM | POA: Diagnosis not present

## 2016-03-18 DIAGNOSIS — M8088XA Other osteoporosis with current pathological fracture, vertebra(e), initial encounter for fracture: Secondary | ICD-10-CM | POA: Diagnosis not present

## 2016-03-18 DIAGNOSIS — Z4789 Encounter for other orthopedic aftercare: Secondary | ICD-10-CM | POA: Diagnosis not present

## 2016-03-21 DIAGNOSIS — Z4789 Encounter for other orthopedic aftercare: Secondary | ICD-10-CM | POA: Diagnosis not present

## 2016-03-21 DIAGNOSIS — M4806 Spinal stenosis, lumbar region: Secondary | ICD-10-CM | POA: Diagnosis not present

## 2016-03-21 DIAGNOSIS — M5416 Radiculopathy, lumbar region: Secondary | ICD-10-CM | POA: Diagnosis not present

## 2016-03-21 DIAGNOSIS — M8088XA Other osteoporosis with current pathological fracture, vertebra(e), initial encounter for fracture: Secondary | ICD-10-CM | POA: Diagnosis not present

## 2016-03-27 DIAGNOSIS — M4806 Spinal stenosis, lumbar region: Secondary | ICD-10-CM | POA: Diagnosis not present

## 2016-03-27 DIAGNOSIS — Z4789 Encounter for other orthopedic aftercare: Secondary | ICD-10-CM | POA: Diagnosis not present

## 2016-03-27 DIAGNOSIS — M8088XA Other osteoporosis with current pathological fracture, vertebra(e), initial encounter for fracture: Secondary | ICD-10-CM | POA: Diagnosis not present

## 2016-03-27 DIAGNOSIS — M5416 Radiculopathy, lumbar region: Secondary | ICD-10-CM | POA: Diagnosis not present

## 2016-03-29 DIAGNOSIS — M5416 Radiculopathy, lumbar region: Secondary | ICD-10-CM | POA: Diagnosis not present

## 2016-03-29 DIAGNOSIS — Z4789 Encounter for other orthopedic aftercare: Secondary | ICD-10-CM | POA: Diagnosis not present

## 2016-03-29 DIAGNOSIS — M4806 Spinal stenosis, lumbar region: Secondary | ICD-10-CM | POA: Diagnosis not present

## 2016-03-29 DIAGNOSIS — M8088XA Other osteoporosis with current pathological fracture, vertebra(e), initial encounter for fracture: Secondary | ICD-10-CM | POA: Diagnosis not present

## 2016-04-02 DIAGNOSIS — M8088XA Other osteoporosis with current pathological fracture, vertebra(e), initial encounter for fracture: Secondary | ICD-10-CM | POA: Diagnosis not present

## 2016-04-02 DIAGNOSIS — Z4789 Encounter for other orthopedic aftercare: Secondary | ICD-10-CM | POA: Diagnosis not present

## 2016-04-02 DIAGNOSIS — M4806 Spinal stenosis, lumbar region: Secondary | ICD-10-CM | POA: Diagnosis not present

## 2016-04-02 DIAGNOSIS — M5416 Radiculopathy, lumbar region: Secondary | ICD-10-CM | POA: Diagnosis not present

## 2016-04-03 DIAGNOSIS — M8088XA Other osteoporosis with current pathological fracture, vertebra(e), initial encounter for fracture: Secondary | ICD-10-CM | POA: Diagnosis not present

## 2016-04-03 DIAGNOSIS — Z4789 Encounter for other orthopedic aftercare: Secondary | ICD-10-CM | POA: Diagnosis not present

## 2016-04-03 DIAGNOSIS — M4806 Spinal stenosis, lumbar region: Secondary | ICD-10-CM | POA: Diagnosis not present

## 2016-04-03 DIAGNOSIS — M5416 Radiculopathy, lumbar region: Secondary | ICD-10-CM | POA: Diagnosis not present

## 2016-04-04 DIAGNOSIS — M4806 Spinal stenosis, lumbar region: Secondary | ICD-10-CM | POA: Diagnosis not present

## 2016-04-04 DIAGNOSIS — Z4789 Encounter for other orthopedic aftercare: Secondary | ICD-10-CM | POA: Diagnosis not present

## 2016-04-04 DIAGNOSIS — M5416 Radiculopathy, lumbar region: Secondary | ICD-10-CM | POA: Diagnosis not present

## 2016-04-04 DIAGNOSIS — M8088XA Other osteoporosis with current pathological fracture, vertebra(e), initial encounter for fracture: Secondary | ICD-10-CM | POA: Diagnosis not present

## 2016-04-08 DIAGNOSIS — M5417 Radiculopathy, lumbosacral region: Secondary | ICD-10-CM | POA: Diagnosis not present

## 2016-04-09 DIAGNOSIS — B372 Candidiasis of skin and nail: Secondary | ICD-10-CM | POA: Diagnosis not present

## 2016-04-09 DIAGNOSIS — M5416 Radiculopathy, lumbar region: Secondary | ICD-10-CM | POA: Diagnosis not present

## 2016-04-09 DIAGNOSIS — H9190 Unspecified hearing loss, unspecified ear: Secondary | ICD-10-CM | POA: Diagnosis not present

## 2016-04-09 DIAGNOSIS — K769 Liver disease, unspecified: Secondary | ICD-10-CM | POA: Diagnosis not present

## 2016-04-09 DIAGNOSIS — I10 Essential (primary) hypertension: Secondary | ICD-10-CM | POA: Diagnosis not present

## 2016-04-09 DIAGNOSIS — M1990 Unspecified osteoarthritis, unspecified site: Secondary | ICD-10-CM | POA: Diagnosis not present

## 2016-04-09 DIAGNOSIS — J449 Chronic obstructive pulmonary disease, unspecified: Secondary | ICD-10-CM | POA: Diagnosis not present

## 2016-04-09 DIAGNOSIS — Z6822 Body mass index (BMI) 22.0-22.9, adult: Secondary | ICD-10-CM | POA: Diagnosis not present

## 2016-04-09 DIAGNOSIS — E7800 Pure hypercholesterolemia, unspecified: Secondary | ICD-10-CM | POA: Diagnosis not present

## 2016-04-09 DIAGNOSIS — N3281 Overactive bladder: Secondary | ICD-10-CM | POA: Diagnosis not present

## 2016-04-11 DIAGNOSIS — M5416 Radiculopathy, lumbar region: Secondary | ICD-10-CM | POA: Diagnosis not present

## 2016-04-11 DIAGNOSIS — M4806 Spinal stenosis, lumbar region: Secondary | ICD-10-CM | POA: Diagnosis not present

## 2016-04-11 DIAGNOSIS — M8088XA Other osteoporosis with current pathological fracture, vertebra(e), initial encounter for fracture: Secondary | ICD-10-CM | POA: Diagnosis not present

## 2016-04-11 DIAGNOSIS — Z4789 Encounter for other orthopedic aftercare: Secondary | ICD-10-CM | POA: Diagnosis not present

## 2016-04-12 DIAGNOSIS — Z4789 Encounter for other orthopedic aftercare: Secondary | ICD-10-CM | POA: Diagnosis not present

## 2016-04-12 DIAGNOSIS — M8088XA Other osteoporosis with current pathological fracture, vertebra(e), initial encounter for fracture: Secondary | ICD-10-CM | POA: Diagnosis not present

## 2016-04-12 DIAGNOSIS — M4806 Spinal stenosis, lumbar region: Secondary | ICD-10-CM | POA: Diagnosis not present

## 2016-04-12 DIAGNOSIS — M5416 Radiculopathy, lumbar region: Secondary | ICD-10-CM | POA: Diagnosis not present

## 2016-04-15 DIAGNOSIS — M5416 Radiculopathy, lumbar region: Secondary | ICD-10-CM | POA: Diagnosis not present

## 2016-04-15 DIAGNOSIS — Z4789 Encounter for other orthopedic aftercare: Secondary | ICD-10-CM | POA: Diagnosis not present

## 2016-04-15 DIAGNOSIS — M4806 Spinal stenosis, lumbar region: Secondary | ICD-10-CM | POA: Diagnosis not present

## 2016-04-15 DIAGNOSIS — M8088XA Other osteoporosis with current pathological fracture, vertebra(e), initial encounter for fracture: Secondary | ICD-10-CM | POA: Diagnosis not present

## 2016-04-22 DIAGNOSIS — M545 Low back pain: Secondary | ICD-10-CM | POA: Diagnosis not present

## 2016-04-22 DIAGNOSIS — M5417 Radiculopathy, lumbosacral region: Secondary | ICD-10-CM | POA: Diagnosis not present

## 2016-05-17 DIAGNOSIS — H0015 Chalazion left lower eyelid: Secondary | ICD-10-CM | POA: Diagnosis not present

## 2016-05-17 DIAGNOSIS — H01006 Unspecified blepharitis left eye, unspecified eyelid: Secondary | ICD-10-CM | POA: Diagnosis not present

## 2016-05-17 DIAGNOSIS — H353130 Nonexudative age-related macular degeneration, bilateral, stage unspecified: Secondary | ICD-10-CM | POA: Diagnosis not present

## 2016-05-17 DIAGNOSIS — H01003 Unspecified blepharitis right eye, unspecified eyelid: Secondary | ICD-10-CM | POA: Diagnosis not present

## 2016-05-20 DIAGNOSIS — M5417 Radiculopathy, lumbosacral region: Secondary | ICD-10-CM | POA: Diagnosis not present

## 2016-05-22 DIAGNOSIS — Z85828 Personal history of other malignant neoplasm of skin: Secondary | ICD-10-CM | POA: Diagnosis not present

## 2016-05-22 DIAGNOSIS — L821 Other seborrheic keratosis: Secondary | ICD-10-CM | POA: Diagnosis not present

## 2016-05-22 DIAGNOSIS — L820 Inflamed seborrheic keratosis: Secondary | ICD-10-CM | POA: Diagnosis not present

## 2016-05-31 DIAGNOSIS — M5417 Radiculopathy, lumbosacral region: Secondary | ICD-10-CM | POA: Diagnosis not present

## 2016-05-31 DIAGNOSIS — M545 Low back pain: Secondary | ICD-10-CM | POA: Diagnosis not present

## 2016-05-31 DIAGNOSIS — M810 Age-related osteoporosis without current pathological fracture: Secondary | ICD-10-CM | POA: Diagnosis not present

## 2016-06-04 DIAGNOSIS — K769 Liver disease, unspecified: Secondary | ICD-10-CM | POA: Diagnosis not present

## 2016-06-04 DIAGNOSIS — E7800 Pure hypercholesterolemia, unspecified: Secondary | ICD-10-CM | POA: Diagnosis not present

## 2016-06-04 DIAGNOSIS — N3281 Overactive bladder: Secondary | ICD-10-CM | POA: Diagnosis not present

## 2016-06-04 DIAGNOSIS — H9190 Unspecified hearing loss, unspecified ear: Secondary | ICD-10-CM | POA: Diagnosis not present

## 2016-06-04 DIAGNOSIS — M5416 Radiculopathy, lumbar region: Secondary | ICD-10-CM | POA: Diagnosis not present

## 2016-06-04 DIAGNOSIS — L0390 Cellulitis, unspecified: Secondary | ICD-10-CM | POA: Diagnosis not present

## 2016-06-04 DIAGNOSIS — M1990 Unspecified osteoarthritis, unspecified site: Secondary | ICD-10-CM | POA: Diagnosis not present

## 2016-06-04 DIAGNOSIS — J449 Chronic obstructive pulmonary disease, unspecified: Secondary | ICD-10-CM | POA: Diagnosis not present

## 2016-06-04 DIAGNOSIS — Z6822 Body mass index (BMI) 22.0-22.9, adult: Secondary | ICD-10-CM | POA: Diagnosis not present

## 2016-06-04 DIAGNOSIS — I10 Essential (primary) hypertension: Secondary | ICD-10-CM | POA: Diagnosis not present

## 2016-06-07 DIAGNOSIS — M5417 Radiculopathy, lumbosacral region: Secondary | ICD-10-CM | POA: Diagnosis not present

## 2016-06-25 DIAGNOSIS — M5441 Lumbago with sciatica, right side: Secondary | ICD-10-CM | POA: Diagnosis not present

## 2016-07-08 DIAGNOSIS — I10 Essential (primary) hypertension: Secondary | ICD-10-CM | POA: Diagnosis not present

## 2016-07-08 DIAGNOSIS — M4856XA Collapsed vertebra, not elsewhere classified, lumbar region, initial encounter for fracture: Secondary | ICD-10-CM | POA: Diagnosis not present

## 2016-07-08 DIAGNOSIS — M4800 Spinal stenosis, site unspecified: Secondary | ICD-10-CM | POA: Diagnosis not present

## 2016-07-08 DIAGNOSIS — R32 Unspecified urinary incontinence: Secondary | ICD-10-CM | POA: Diagnosis not present

## 2016-07-11 DIAGNOSIS — M4856XD Collapsed vertebra, not elsewhere classified, lumbar region, subsequent encounter for fracture with routine healing: Secondary | ICD-10-CM | POA: Diagnosis not present

## 2016-07-11 DIAGNOSIS — Z9181 History of falling: Secondary | ICD-10-CM | POA: Diagnosis not present

## 2016-07-11 DIAGNOSIS — M5416 Radiculopathy, lumbar region: Secondary | ICD-10-CM | POA: Diagnosis not present

## 2016-07-11 DIAGNOSIS — M4800 Spinal stenosis, site unspecified: Secondary | ICD-10-CM | POA: Diagnosis not present

## 2016-07-11 DIAGNOSIS — M6281 Muscle weakness (generalized): Secondary | ICD-10-CM | POA: Diagnosis not present

## 2016-07-11 DIAGNOSIS — I10 Essential (primary) hypertension: Secondary | ICD-10-CM | POA: Diagnosis not present

## 2016-07-15 DIAGNOSIS — I10 Essential (primary) hypertension: Secondary | ICD-10-CM | POA: Diagnosis not present

## 2016-07-15 DIAGNOSIS — M4800 Spinal stenosis, site unspecified: Secondary | ICD-10-CM | POA: Diagnosis not present

## 2016-07-15 DIAGNOSIS — M5416 Radiculopathy, lumbar region: Secondary | ICD-10-CM | POA: Diagnosis not present

## 2016-07-15 DIAGNOSIS — M4856XD Collapsed vertebra, not elsewhere classified, lumbar region, subsequent encounter for fracture with routine healing: Secondary | ICD-10-CM | POA: Diagnosis not present

## 2016-07-15 DIAGNOSIS — Z9181 History of falling: Secondary | ICD-10-CM | POA: Diagnosis not present

## 2016-07-15 DIAGNOSIS — M6281 Muscle weakness (generalized): Secondary | ICD-10-CM | POA: Diagnosis not present

## 2016-07-17 DIAGNOSIS — Z9181 History of falling: Secondary | ICD-10-CM | POA: Diagnosis not present

## 2016-07-17 DIAGNOSIS — I10 Essential (primary) hypertension: Secondary | ICD-10-CM | POA: Diagnosis not present

## 2016-07-17 DIAGNOSIS — M4856XD Collapsed vertebra, not elsewhere classified, lumbar region, subsequent encounter for fracture with routine healing: Secondary | ICD-10-CM | POA: Diagnosis not present

## 2016-07-17 DIAGNOSIS — M6281 Muscle weakness (generalized): Secondary | ICD-10-CM | POA: Diagnosis not present

## 2016-07-17 DIAGNOSIS — M5416 Radiculopathy, lumbar region: Secondary | ICD-10-CM | POA: Diagnosis not present

## 2016-07-17 DIAGNOSIS — M4800 Spinal stenosis, site unspecified: Secondary | ICD-10-CM | POA: Diagnosis not present

## 2016-07-18 DIAGNOSIS — M4856XD Collapsed vertebra, not elsewhere classified, lumbar region, subsequent encounter for fracture with routine healing: Secondary | ICD-10-CM | POA: Diagnosis not present

## 2016-07-18 DIAGNOSIS — M4800 Spinal stenosis, site unspecified: Secondary | ICD-10-CM | POA: Diagnosis not present

## 2016-07-18 DIAGNOSIS — Z9181 History of falling: Secondary | ICD-10-CM | POA: Diagnosis not present

## 2016-07-18 DIAGNOSIS — M6281 Muscle weakness (generalized): Secondary | ICD-10-CM | POA: Diagnosis not present

## 2016-07-18 DIAGNOSIS — M5416 Radiculopathy, lumbar region: Secondary | ICD-10-CM | POA: Diagnosis not present

## 2016-07-18 DIAGNOSIS — I10 Essential (primary) hypertension: Secondary | ICD-10-CM | POA: Diagnosis not present

## 2016-07-23 DIAGNOSIS — M4856XD Collapsed vertebra, not elsewhere classified, lumbar region, subsequent encounter for fracture with routine healing: Secondary | ICD-10-CM | POA: Diagnosis not present

## 2016-07-23 DIAGNOSIS — M4800 Spinal stenosis, site unspecified: Secondary | ICD-10-CM | POA: Diagnosis not present

## 2016-07-23 DIAGNOSIS — I10 Essential (primary) hypertension: Secondary | ICD-10-CM | POA: Diagnosis not present

## 2016-07-23 DIAGNOSIS — Z9181 History of falling: Secondary | ICD-10-CM | POA: Diagnosis not present

## 2016-07-23 DIAGNOSIS — M6281 Muscle weakness (generalized): Secondary | ICD-10-CM | POA: Diagnosis not present

## 2016-07-23 DIAGNOSIS — M5416 Radiculopathy, lumbar region: Secondary | ICD-10-CM | POA: Diagnosis not present

## 2016-07-24 DIAGNOSIS — M5416 Radiculopathy, lumbar region: Secondary | ICD-10-CM | POA: Diagnosis not present

## 2016-07-24 DIAGNOSIS — M6281 Muscle weakness (generalized): Secondary | ICD-10-CM | POA: Diagnosis not present

## 2016-07-24 DIAGNOSIS — M4800 Spinal stenosis, site unspecified: Secondary | ICD-10-CM | POA: Diagnosis not present

## 2016-07-24 DIAGNOSIS — I10 Essential (primary) hypertension: Secondary | ICD-10-CM | POA: Diagnosis not present

## 2016-07-24 DIAGNOSIS — M4856XD Collapsed vertebra, not elsewhere classified, lumbar region, subsequent encounter for fracture with routine healing: Secondary | ICD-10-CM | POA: Diagnosis not present

## 2016-07-24 DIAGNOSIS — Z9181 History of falling: Secondary | ICD-10-CM | POA: Diagnosis not present

## 2016-07-25 DIAGNOSIS — M4800 Spinal stenosis, site unspecified: Secondary | ICD-10-CM | POA: Diagnosis not present

## 2016-07-25 DIAGNOSIS — Z9181 History of falling: Secondary | ICD-10-CM | POA: Diagnosis not present

## 2016-07-25 DIAGNOSIS — M4856XD Collapsed vertebra, not elsewhere classified, lumbar region, subsequent encounter for fracture with routine healing: Secondary | ICD-10-CM | POA: Diagnosis not present

## 2016-07-25 DIAGNOSIS — I10 Essential (primary) hypertension: Secondary | ICD-10-CM | POA: Diagnosis not present

## 2016-07-25 DIAGNOSIS — M6281 Muscle weakness (generalized): Secondary | ICD-10-CM | POA: Diagnosis not present

## 2016-07-25 DIAGNOSIS — M5416 Radiculopathy, lumbar region: Secondary | ICD-10-CM | POA: Diagnosis not present

## 2016-07-30 DIAGNOSIS — M5416 Radiculopathy, lumbar region: Secondary | ICD-10-CM | POA: Diagnosis not present

## 2016-07-30 DIAGNOSIS — M4800 Spinal stenosis, site unspecified: Secondary | ICD-10-CM | POA: Diagnosis not present

## 2016-07-30 DIAGNOSIS — Z9181 History of falling: Secondary | ICD-10-CM | POA: Diagnosis not present

## 2016-07-30 DIAGNOSIS — I10 Essential (primary) hypertension: Secondary | ICD-10-CM | POA: Diagnosis not present

## 2016-07-30 DIAGNOSIS — M6281 Muscle weakness (generalized): Secondary | ICD-10-CM | POA: Diagnosis not present

## 2016-07-30 DIAGNOSIS — M4856XD Collapsed vertebra, not elsewhere classified, lumbar region, subsequent encounter for fracture with routine healing: Secondary | ICD-10-CM | POA: Diagnosis not present

## 2016-07-31 DIAGNOSIS — M6281 Muscle weakness (generalized): Secondary | ICD-10-CM | POA: Diagnosis not present

## 2016-07-31 DIAGNOSIS — I10 Essential (primary) hypertension: Secondary | ICD-10-CM | POA: Diagnosis not present

## 2016-07-31 DIAGNOSIS — Z9181 History of falling: Secondary | ICD-10-CM | POA: Diagnosis not present

## 2016-07-31 DIAGNOSIS — M4856XD Collapsed vertebra, not elsewhere classified, lumbar region, subsequent encounter for fracture with routine healing: Secondary | ICD-10-CM | POA: Diagnosis not present

## 2016-07-31 DIAGNOSIS — M4800 Spinal stenosis, site unspecified: Secondary | ICD-10-CM | POA: Diagnosis not present

## 2016-07-31 DIAGNOSIS — M5416 Radiculopathy, lumbar region: Secondary | ICD-10-CM | POA: Diagnosis not present

## 2016-08-03 DIAGNOSIS — M6281 Muscle weakness (generalized): Secondary | ICD-10-CM | POA: Diagnosis not present

## 2016-08-03 DIAGNOSIS — M4800 Spinal stenosis, site unspecified: Secondary | ICD-10-CM | POA: Diagnosis not present

## 2016-08-03 DIAGNOSIS — M5416 Radiculopathy, lumbar region: Secondary | ICD-10-CM | POA: Diagnosis not present

## 2016-08-03 DIAGNOSIS — M4856XD Collapsed vertebra, not elsewhere classified, lumbar region, subsequent encounter for fracture with routine healing: Secondary | ICD-10-CM | POA: Diagnosis not present

## 2016-08-03 DIAGNOSIS — Z9181 History of falling: Secondary | ICD-10-CM | POA: Diagnosis not present

## 2016-08-03 DIAGNOSIS — I10 Essential (primary) hypertension: Secondary | ICD-10-CM | POA: Diagnosis not present

## 2016-08-05 DIAGNOSIS — Z23 Encounter for immunization: Secondary | ICD-10-CM | POA: Diagnosis not present

## 2016-08-05 DIAGNOSIS — M5416 Radiculopathy, lumbar region: Secondary | ICD-10-CM | POA: Diagnosis not present

## 2016-08-05 DIAGNOSIS — Z9181 History of falling: Secondary | ICD-10-CM | POA: Diagnosis not present

## 2016-08-05 DIAGNOSIS — J111 Influenza due to unidentified influenza virus with other respiratory manifestations: Secondary | ICD-10-CM | POA: Diagnosis not present

## 2016-08-05 DIAGNOSIS — M5432 Sciatica, left side: Secondary | ICD-10-CM | POA: Diagnosis not present

## 2016-08-05 DIAGNOSIS — M4800 Spinal stenosis, site unspecified: Secondary | ICD-10-CM | POA: Diagnosis not present

## 2016-08-05 DIAGNOSIS — M4856XD Collapsed vertebra, not elsewhere classified, lumbar region, subsequent encounter for fracture with routine healing: Secondary | ICD-10-CM | POA: Diagnosis not present

## 2016-08-05 DIAGNOSIS — I10 Essential (primary) hypertension: Secondary | ICD-10-CM | POA: Diagnosis not present

## 2016-08-05 DIAGNOSIS — M6281 Muscle weakness (generalized): Secondary | ICD-10-CM | POA: Diagnosis not present

## 2016-08-07 DIAGNOSIS — M4800 Spinal stenosis, site unspecified: Secondary | ICD-10-CM | POA: Diagnosis not present

## 2016-08-07 DIAGNOSIS — Z9181 History of falling: Secondary | ICD-10-CM | POA: Diagnosis not present

## 2016-08-07 DIAGNOSIS — I10 Essential (primary) hypertension: Secondary | ICD-10-CM | POA: Diagnosis not present

## 2016-08-07 DIAGNOSIS — M4856XD Collapsed vertebra, not elsewhere classified, lumbar region, subsequent encounter for fracture with routine healing: Secondary | ICD-10-CM | POA: Diagnosis not present

## 2016-08-07 DIAGNOSIS — M6281 Muscle weakness (generalized): Secondary | ICD-10-CM | POA: Diagnosis not present

## 2016-08-07 DIAGNOSIS — M5416 Radiculopathy, lumbar region: Secondary | ICD-10-CM | POA: Diagnosis not present

## 2016-08-08 DIAGNOSIS — M4856XD Collapsed vertebra, not elsewhere classified, lumbar region, subsequent encounter for fracture with routine healing: Secondary | ICD-10-CM | POA: Diagnosis not present

## 2016-08-08 DIAGNOSIS — M6281 Muscle weakness (generalized): Secondary | ICD-10-CM | POA: Diagnosis not present

## 2016-08-08 DIAGNOSIS — I10 Essential (primary) hypertension: Secondary | ICD-10-CM | POA: Diagnosis not present

## 2016-08-08 DIAGNOSIS — Z9181 History of falling: Secondary | ICD-10-CM | POA: Diagnosis not present

## 2016-08-08 DIAGNOSIS — M5416 Radiculopathy, lumbar region: Secondary | ICD-10-CM | POA: Diagnosis not present

## 2016-08-08 DIAGNOSIS — M4800 Spinal stenosis, site unspecified: Secondary | ICD-10-CM | POA: Diagnosis not present

## 2016-08-12 DIAGNOSIS — M4800 Spinal stenosis, site unspecified: Secondary | ICD-10-CM | POA: Diagnosis not present

## 2016-08-12 DIAGNOSIS — M4856XD Collapsed vertebra, not elsewhere classified, lumbar region, subsequent encounter for fracture with routine healing: Secondary | ICD-10-CM | POA: Diagnosis not present

## 2016-08-12 DIAGNOSIS — I10 Essential (primary) hypertension: Secondary | ICD-10-CM | POA: Diagnosis not present

## 2016-08-12 DIAGNOSIS — M5416 Radiculopathy, lumbar region: Secondary | ICD-10-CM | POA: Diagnosis not present

## 2016-08-12 DIAGNOSIS — Z9181 History of falling: Secondary | ICD-10-CM | POA: Diagnosis not present

## 2016-08-12 DIAGNOSIS — M6281 Muscle weakness (generalized): Secondary | ICD-10-CM | POA: Diagnosis not present

## 2016-08-13 DIAGNOSIS — M4856XD Collapsed vertebra, not elsewhere classified, lumbar region, subsequent encounter for fracture with routine healing: Secondary | ICD-10-CM | POA: Diagnosis not present

## 2016-08-13 DIAGNOSIS — M4800 Spinal stenosis, site unspecified: Secondary | ICD-10-CM | POA: Diagnosis not present

## 2016-08-13 DIAGNOSIS — Z9181 History of falling: Secondary | ICD-10-CM | POA: Diagnosis not present

## 2016-08-13 DIAGNOSIS — M5416 Radiculopathy, lumbar region: Secondary | ICD-10-CM | POA: Diagnosis not present

## 2016-08-13 DIAGNOSIS — M6281 Muscle weakness (generalized): Secondary | ICD-10-CM | POA: Diagnosis not present

## 2016-08-13 DIAGNOSIS — I10 Essential (primary) hypertension: Secondary | ICD-10-CM | POA: Diagnosis not present

## 2016-08-14 DIAGNOSIS — M6281 Muscle weakness (generalized): Secondary | ICD-10-CM | POA: Diagnosis not present

## 2016-08-14 DIAGNOSIS — I10 Essential (primary) hypertension: Secondary | ICD-10-CM | POA: Diagnosis not present

## 2016-08-14 DIAGNOSIS — M4856XD Collapsed vertebra, not elsewhere classified, lumbar region, subsequent encounter for fracture with routine healing: Secondary | ICD-10-CM | POA: Diagnosis not present

## 2016-08-14 DIAGNOSIS — M5416 Radiculopathy, lumbar region: Secondary | ICD-10-CM | POA: Diagnosis not present

## 2016-08-14 DIAGNOSIS — M4800 Spinal stenosis, site unspecified: Secondary | ICD-10-CM | POA: Diagnosis not present

## 2016-08-14 DIAGNOSIS — Z9181 History of falling: Secondary | ICD-10-CM | POA: Diagnosis not present

## 2016-08-15 DIAGNOSIS — Z9181 History of falling: Secondary | ICD-10-CM | POA: Diagnosis not present

## 2016-08-15 DIAGNOSIS — M4856XD Collapsed vertebra, not elsewhere classified, lumbar region, subsequent encounter for fracture with routine healing: Secondary | ICD-10-CM | POA: Diagnosis not present

## 2016-08-15 DIAGNOSIS — I10 Essential (primary) hypertension: Secondary | ICD-10-CM | POA: Diagnosis not present

## 2016-08-15 DIAGNOSIS — M5416 Radiculopathy, lumbar region: Secondary | ICD-10-CM | POA: Diagnosis not present

## 2016-08-15 DIAGNOSIS — M6281 Muscle weakness (generalized): Secondary | ICD-10-CM | POA: Diagnosis not present

## 2016-08-15 DIAGNOSIS — M4800 Spinal stenosis, site unspecified: Secondary | ICD-10-CM | POA: Diagnosis not present

## 2016-08-20 DIAGNOSIS — M6281 Muscle weakness (generalized): Secondary | ICD-10-CM | POA: Diagnosis not present

## 2016-08-20 DIAGNOSIS — M4800 Spinal stenosis, site unspecified: Secondary | ICD-10-CM | POA: Diagnosis not present

## 2016-08-20 DIAGNOSIS — Z9181 History of falling: Secondary | ICD-10-CM | POA: Diagnosis not present

## 2016-08-20 DIAGNOSIS — M4856XD Collapsed vertebra, not elsewhere classified, lumbar region, subsequent encounter for fracture with routine healing: Secondary | ICD-10-CM | POA: Diagnosis not present

## 2016-08-20 DIAGNOSIS — M5416 Radiculopathy, lumbar region: Secondary | ICD-10-CM | POA: Diagnosis not present

## 2016-08-20 DIAGNOSIS — I10 Essential (primary) hypertension: Secondary | ICD-10-CM | POA: Diagnosis not present

## 2016-08-22 DIAGNOSIS — M4856XD Collapsed vertebra, not elsewhere classified, lumbar region, subsequent encounter for fracture with routine healing: Secondary | ICD-10-CM | POA: Diagnosis not present

## 2016-08-22 DIAGNOSIS — Z9181 History of falling: Secondary | ICD-10-CM | POA: Diagnosis not present

## 2016-08-22 DIAGNOSIS — M6281 Muscle weakness (generalized): Secondary | ICD-10-CM | POA: Diagnosis not present

## 2016-08-22 DIAGNOSIS — M5416 Radiculopathy, lumbar region: Secondary | ICD-10-CM | POA: Diagnosis not present

## 2016-08-22 DIAGNOSIS — I10 Essential (primary) hypertension: Secondary | ICD-10-CM | POA: Diagnosis not present

## 2016-08-22 DIAGNOSIS — M4800 Spinal stenosis, site unspecified: Secondary | ICD-10-CM | POA: Diagnosis not present

## 2016-08-26 DIAGNOSIS — M4856XD Collapsed vertebra, not elsewhere classified, lumbar region, subsequent encounter for fracture with routine healing: Secondary | ICD-10-CM | POA: Diagnosis not present

## 2016-08-26 DIAGNOSIS — M6281 Muscle weakness (generalized): Secondary | ICD-10-CM | POA: Diagnosis not present

## 2016-08-26 DIAGNOSIS — I10 Essential (primary) hypertension: Secondary | ICD-10-CM | POA: Diagnosis not present

## 2016-08-26 DIAGNOSIS — Z9181 History of falling: Secondary | ICD-10-CM | POA: Diagnosis not present

## 2016-08-26 DIAGNOSIS — M4800 Spinal stenosis, site unspecified: Secondary | ICD-10-CM | POA: Diagnosis not present

## 2016-08-26 DIAGNOSIS — M5416 Radiculopathy, lumbar region: Secondary | ICD-10-CM | POA: Diagnosis not present

## 2016-08-27 DIAGNOSIS — M6281 Muscle weakness (generalized): Secondary | ICD-10-CM | POA: Diagnosis not present

## 2016-08-27 DIAGNOSIS — I10 Essential (primary) hypertension: Secondary | ICD-10-CM | POA: Diagnosis not present

## 2016-08-27 DIAGNOSIS — M5416 Radiculopathy, lumbar region: Secondary | ICD-10-CM | POA: Diagnosis not present

## 2016-08-27 DIAGNOSIS — M4800 Spinal stenosis, site unspecified: Secondary | ICD-10-CM | POA: Diagnosis not present

## 2016-08-27 DIAGNOSIS — M4856XD Collapsed vertebra, not elsewhere classified, lumbar region, subsequent encounter for fracture with routine healing: Secondary | ICD-10-CM | POA: Diagnosis not present

## 2016-08-27 DIAGNOSIS — Z9181 History of falling: Secondary | ICD-10-CM | POA: Diagnosis not present

## 2016-08-28 DIAGNOSIS — M5416 Radiculopathy, lumbar region: Secondary | ICD-10-CM | POA: Diagnosis not present

## 2016-08-28 DIAGNOSIS — M4800 Spinal stenosis, site unspecified: Secondary | ICD-10-CM | POA: Diagnosis not present

## 2016-08-28 DIAGNOSIS — Z9181 History of falling: Secondary | ICD-10-CM | POA: Diagnosis not present

## 2016-08-28 DIAGNOSIS — I10 Essential (primary) hypertension: Secondary | ICD-10-CM | POA: Diagnosis not present

## 2016-08-28 DIAGNOSIS — M6281 Muscle weakness (generalized): Secondary | ICD-10-CM | POA: Diagnosis not present

## 2016-08-28 DIAGNOSIS — M4856XD Collapsed vertebra, not elsewhere classified, lumbar region, subsequent encounter for fracture with routine healing: Secondary | ICD-10-CM | POA: Diagnosis not present

## 2016-09-02 DIAGNOSIS — M4800 Spinal stenosis, site unspecified: Secondary | ICD-10-CM | POA: Diagnosis not present

## 2016-09-02 DIAGNOSIS — I10 Essential (primary) hypertension: Secondary | ICD-10-CM | POA: Diagnosis not present

## 2016-09-02 DIAGNOSIS — Z9181 History of falling: Secondary | ICD-10-CM | POA: Diagnosis not present

## 2016-09-02 DIAGNOSIS — M6281 Muscle weakness (generalized): Secondary | ICD-10-CM | POA: Diagnosis not present

## 2016-09-02 DIAGNOSIS — M5416 Radiculopathy, lumbar region: Secondary | ICD-10-CM | POA: Diagnosis not present

## 2016-09-02 DIAGNOSIS — M4856XD Collapsed vertebra, not elsewhere classified, lumbar region, subsequent encounter for fracture with routine healing: Secondary | ICD-10-CM | POA: Diagnosis not present

## 2016-09-04 DIAGNOSIS — M5416 Radiculopathy, lumbar region: Secondary | ICD-10-CM | POA: Diagnosis not present

## 2016-09-04 DIAGNOSIS — I10 Essential (primary) hypertension: Secondary | ICD-10-CM | POA: Diagnosis not present

## 2016-09-04 DIAGNOSIS — M4800 Spinal stenosis, site unspecified: Secondary | ICD-10-CM | POA: Diagnosis not present

## 2016-09-04 DIAGNOSIS — Z9181 History of falling: Secondary | ICD-10-CM | POA: Diagnosis not present

## 2016-09-04 DIAGNOSIS — M6281 Muscle weakness (generalized): Secondary | ICD-10-CM | POA: Diagnosis not present

## 2016-09-04 DIAGNOSIS — M4856XD Collapsed vertebra, not elsewhere classified, lumbar region, subsequent encounter for fracture with routine healing: Secondary | ICD-10-CM | POA: Diagnosis not present

## 2016-09-09 DIAGNOSIS — S80811D Abrasion, right lower leg, subsequent encounter: Secondary | ICD-10-CM | POA: Diagnosis not present

## 2016-09-09 DIAGNOSIS — M4856XD Collapsed vertebra, not elsewhere classified, lumbar region, subsequent encounter for fracture with routine healing: Secondary | ICD-10-CM | POA: Diagnosis not present

## 2016-09-09 DIAGNOSIS — M5416 Radiculopathy, lumbar region: Secondary | ICD-10-CM | POA: Diagnosis not present

## 2016-09-09 DIAGNOSIS — M4800 Spinal stenosis, site unspecified: Secondary | ICD-10-CM | POA: Diagnosis not present

## 2016-09-09 DIAGNOSIS — I10 Essential (primary) hypertension: Secondary | ICD-10-CM | POA: Diagnosis not present

## 2016-09-09 DIAGNOSIS — I872 Venous insufficiency (chronic) (peripheral): Secondary | ICD-10-CM | POA: Diagnosis not present

## 2016-09-10 DIAGNOSIS — M4800 Spinal stenosis, site unspecified: Secondary | ICD-10-CM | POA: Diagnosis not present

## 2016-09-10 DIAGNOSIS — M4856XD Collapsed vertebra, not elsewhere classified, lumbar region, subsequent encounter for fracture with routine healing: Secondary | ICD-10-CM | POA: Diagnosis not present

## 2016-09-10 DIAGNOSIS — I872 Venous insufficiency (chronic) (peripheral): Secondary | ICD-10-CM | POA: Diagnosis not present

## 2016-09-10 DIAGNOSIS — M5416 Radiculopathy, lumbar region: Secondary | ICD-10-CM | POA: Diagnosis not present

## 2016-09-10 DIAGNOSIS — S80811D Abrasion, right lower leg, subsequent encounter: Secondary | ICD-10-CM | POA: Diagnosis not present

## 2016-09-10 DIAGNOSIS — I10 Essential (primary) hypertension: Secondary | ICD-10-CM | POA: Diagnosis not present

## 2016-09-11 DIAGNOSIS — I10 Essential (primary) hypertension: Secondary | ICD-10-CM | POA: Diagnosis not present

## 2016-09-11 DIAGNOSIS — M4856XD Collapsed vertebra, not elsewhere classified, lumbar region, subsequent encounter for fracture with routine healing: Secondary | ICD-10-CM | POA: Diagnosis not present

## 2016-09-11 DIAGNOSIS — S80811D Abrasion, right lower leg, subsequent encounter: Secondary | ICD-10-CM | POA: Diagnosis not present

## 2016-09-11 DIAGNOSIS — M5416 Radiculopathy, lumbar region: Secondary | ICD-10-CM | POA: Diagnosis not present

## 2016-09-11 DIAGNOSIS — B354 Tinea corporis: Secondary | ICD-10-CM | POA: Diagnosis not present

## 2016-09-11 DIAGNOSIS — M4856XA Collapsed vertebra, not elsewhere classified, lumbar region, initial encounter for fracture: Secondary | ICD-10-CM | POA: Diagnosis not present

## 2016-09-11 DIAGNOSIS — E8770 Fluid overload, unspecified: Secondary | ICD-10-CM | POA: Diagnosis not present

## 2016-09-11 DIAGNOSIS — M4800 Spinal stenosis, site unspecified: Secondary | ICD-10-CM | POA: Diagnosis not present

## 2016-09-11 DIAGNOSIS — R600 Localized edema: Secondary | ICD-10-CM | POA: Diagnosis not present

## 2016-09-11 DIAGNOSIS — I872 Venous insufficiency (chronic) (peripheral): Secondary | ICD-10-CM | POA: Diagnosis not present

## 2016-09-16 DIAGNOSIS — R600 Localized edema: Secondary | ICD-10-CM | POA: Diagnosis not present

## 2016-09-16 DIAGNOSIS — I872 Venous insufficiency (chronic) (peripheral): Secondary | ICD-10-CM | POA: Diagnosis not present

## 2016-09-16 DIAGNOSIS — M5416 Radiculopathy, lumbar region: Secondary | ICD-10-CM | POA: Diagnosis not present

## 2016-09-16 DIAGNOSIS — M4800 Spinal stenosis, site unspecified: Secondary | ICD-10-CM | POA: Diagnosis not present

## 2016-09-16 DIAGNOSIS — M4856XD Collapsed vertebra, not elsewhere classified, lumbar region, subsequent encounter for fracture with routine healing: Secondary | ICD-10-CM | POA: Diagnosis not present

## 2016-09-16 DIAGNOSIS — S80811D Abrasion, right lower leg, subsequent encounter: Secondary | ICD-10-CM | POA: Diagnosis not present

## 2016-09-16 DIAGNOSIS — I10 Essential (primary) hypertension: Secondary | ICD-10-CM | POA: Diagnosis not present

## 2016-09-17 DIAGNOSIS — R600 Localized edema: Secondary | ICD-10-CM | POA: Diagnosis not present

## 2016-09-17 DIAGNOSIS — Z23 Encounter for immunization: Secondary | ICD-10-CM | POA: Diagnosis not present

## 2016-09-17 DIAGNOSIS — I83893 Varicose veins of bilateral lower extremities with other complications: Secondary | ICD-10-CM | POA: Diagnosis not present

## 2016-09-18 DIAGNOSIS — I10 Essential (primary) hypertension: Secondary | ICD-10-CM | POA: Diagnosis not present

## 2016-09-18 DIAGNOSIS — M4800 Spinal stenosis, site unspecified: Secondary | ICD-10-CM | POA: Diagnosis not present

## 2016-09-18 DIAGNOSIS — M4856XD Collapsed vertebra, not elsewhere classified, lumbar region, subsequent encounter for fracture with routine healing: Secondary | ICD-10-CM | POA: Diagnosis not present

## 2016-09-18 DIAGNOSIS — I872 Venous insufficiency (chronic) (peripheral): Secondary | ICD-10-CM | POA: Diagnosis not present

## 2016-09-18 DIAGNOSIS — M5416 Radiculopathy, lumbar region: Secondary | ICD-10-CM | POA: Diagnosis not present

## 2016-09-18 DIAGNOSIS — S80811D Abrasion, right lower leg, subsequent encounter: Secondary | ICD-10-CM | POA: Diagnosis not present

## 2016-09-19 DIAGNOSIS — I872 Venous insufficiency (chronic) (peripheral): Secondary | ICD-10-CM | POA: Diagnosis not present

## 2016-09-19 DIAGNOSIS — S80811D Abrasion, right lower leg, subsequent encounter: Secondary | ICD-10-CM | POA: Diagnosis not present

## 2016-09-19 DIAGNOSIS — M5416 Radiculopathy, lumbar region: Secondary | ICD-10-CM | POA: Diagnosis not present

## 2016-09-19 DIAGNOSIS — M4800 Spinal stenosis, site unspecified: Secondary | ICD-10-CM | POA: Diagnosis not present

## 2016-09-19 DIAGNOSIS — M4856XD Collapsed vertebra, not elsewhere classified, lumbar region, subsequent encounter for fracture with routine healing: Secondary | ICD-10-CM | POA: Diagnosis not present

## 2016-09-19 DIAGNOSIS — I10 Essential (primary) hypertension: Secondary | ICD-10-CM | POA: Diagnosis not present

## 2016-09-23 DIAGNOSIS — S80811D Abrasion, right lower leg, subsequent encounter: Secondary | ICD-10-CM | POA: Diagnosis not present

## 2016-09-23 DIAGNOSIS — M4800 Spinal stenosis, site unspecified: Secondary | ICD-10-CM | POA: Diagnosis not present

## 2016-09-23 DIAGNOSIS — M5416 Radiculopathy, lumbar region: Secondary | ICD-10-CM | POA: Diagnosis not present

## 2016-09-23 DIAGNOSIS — I872 Venous insufficiency (chronic) (peripheral): Secondary | ICD-10-CM | POA: Diagnosis not present

## 2016-09-23 DIAGNOSIS — I10 Essential (primary) hypertension: Secondary | ICD-10-CM | POA: Diagnosis not present

## 2016-09-23 DIAGNOSIS — M4856XD Collapsed vertebra, not elsewhere classified, lumbar region, subsequent encounter for fracture with routine healing: Secondary | ICD-10-CM | POA: Diagnosis not present

## 2016-09-25 DIAGNOSIS — M5416 Radiculopathy, lumbar region: Secondary | ICD-10-CM | POA: Diagnosis not present

## 2016-09-25 DIAGNOSIS — S80811D Abrasion, right lower leg, subsequent encounter: Secondary | ICD-10-CM | POA: Diagnosis not present

## 2016-09-25 DIAGNOSIS — I10 Essential (primary) hypertension: Secondary | ICD-10-CM | POA: Diagnosis not present

## 2016-09-25 DIAGNOSIS — M4856XD Collapsed vertebra, not elsewhere classified, lumbar region, subsequent encounter for fracture with routine healing: Secondary | ICD-10-CM | POA: Diagnosis not present

## 2016-09-25 DIAGNOSIS — I872 Venous insufficiency (chronic) (peripheral): Secondary | ICD-10-CM | POA: Diagnosis not present

## 2016-09-25 DIAGNOSIS — M4800 Spinal stenosis, site unspecified: Secondary | ICD-10-CM | POA: Diagnosis not present

## 2016-09-27 DIAGNOSIS — R600 Localized edema: Secondary | ICD-10-CM | POA: Diagnosis not present

## 2016-09-27 DIAGNOSIS — I10 Essential (primary) hypertension: Secondary | ICD-10-CM | POA: Diagnosis not present

## 2016-09-30 DIAGNOSIS — I10 Essential (primary) hypertension: Secondary | ICD-10-CM | POA: Diagnosis not present

## 2016-09-30 DIAGNOSIS — M5416 Radiculopathy, lumbar region: Secondary | ICD-10-CM | POA: Diagnosis not present

## 2016-09-30 DIAGNOSIS — S80811D Abrasion, right lower leg, subsequent encounter: Secondary | ICD-10-CM | POA: Diagnosis not present

## 2016-09-30 DIAGNOSIS — I872 Venous insufficiency (chronic) (peripheral): Secondary | ICD-10-CM | POA: Diagnosis not present

## 2016-09-30 DIAGNOSIS — M4800 Spinal stenosis, site unspecified: Secondary | ICD-10-CM | POA: Diagnosis not present

## 2016-09-30 DIAGNOSIS — M4856XD Collapsed vertebra, not elsewhere classified, lumbar region, subsequent encounter for fracture with routine healing: Secondary | ICD-10-CM | POA: Diagnosis not present

## 2016-10-02 DIAGNOSIS — M5416 Radiculopathy, lumbar region: Secondary | ICD-10-CM | POA: Diagnosis not present

## 2016-10-02 DIAGNOSIS — I10 Essential (primary) hypertension: Secondary | ICD-10-CM | POA: Diagnosis not present

## 2016-10-02 DIAGNOSIS — I872 Venous insufficiency (chronic) (peripheral): Secondary | ICD-10-CM | POA: Diagnosis not present

## 2016-10-02 DIAGNOSIS — S80811D Abrasion, right lower leg, subsequent encounter: Secondary | ICD-10-CM | POA: Diagnosis not present

## 2016-10-02 DIAGNOSIS — M4800 Spinal stenosis, site unspecified: Secondary | ICD-10-CM | POA: Diagnosis not present

## 2016-10-02 DIAGNOSIS — M4856XD Collapsed vertebra, not elsewhere classified, lumbar region, subsequent encounter for fracture with routine healing: Secondary | ICD-10-CM | POA: Diagnosis not present

## 2016-10-07 DIAGNOSIS — S80811D Abrasion, right lower leg, subsequent encounter: Secondary | ICD-10-CM | POA: Diagnosis not present

## 2016-10-07 DIAGNOSIS — M4800 Spinal stenosis, site unspecified: Secondary | ICD-10-CM | POA: Diagnosis not present

## 2016-10-07 DIAGNOSIS — M5416 Radiculopathy, lumbar region: Secondary | ICD-10-CM | POA: Diagnosis not present

## 2016-10-07 DIAGNOSIS — I10 Essential (primary) hypertension: Secondary | ICD-10-CM | POA: Diagnosis not present

## 2016-10-07 DIAGNOSIS — M4856XD Collapsed vertebra, not elsewhere classified, lumbar region, subsequent encounter for fracture with routine healing: Secondary | ICD-10-CM | POA: Diagnosis not present

## 2016-10-07 DIAGNOSIS — I872 Venous insufficiency (chronic) (peripheral): Secondary | ICD-10-CM | POA: Diagnosis not present

## 2016-10-09 DIAGNOSIS — I872 Venous insufficiency (chronic) (peripheral): Secondary | ICD-10-CM | POA: Diagnosis not present

## 2016-10-09 DIAGNOSIS — M4856XD Collapsed vertebra, not elsewhere classified, lumbar region, subsequent encounter for fracture with routine healing: Secondary | ICD-10-CM | POA: Diagnosis not present

## 2016-10-09 DIAGNOSIS — M5416 Radiculopathy, lumbar region: Secondary | ICD-10-CM | POA: Diagnosis not present

## 2016-10-09 DIAGNOSIS — M4800 Spinal stenosis, site unspecified: Secondary | ICD-10-CM | POA: Diagnosis not present

## 2016-10-09 DIAGNOSIS — S80811D Abrasion, right lower leg, subsequent encounter: Secondary | ICD-10-CM | POA: Diagnosis not present

## 2016-10-09 DIAGNOSIS — I10 Essential (primary) hypertension: Secondary | ICD-10-CM | POA: Diagnosis not present

## 2016-10-14 DIAGNOSIS — I872 Venous insufficiency (chronic) (peripheral): Secondary | ICD-10-CM | POA: Diagnosis not present

## 2016-10-14 DIAGNOSIS — M4856XD Collapsed vertebra, not elsewhere classified, lumbar region, subsequent encounter for fracture with routine healing: Secondary | ICD-10-CM | POA: Diagnosis not present

## 2016-10-14 DIAGNOSIS — S80811D Abrasion, right lower leg, subsequent encounter: Secondary | ICD-10-CM | POA: Diagnosis not present

## 2016-10-14 DIAGNOSIS — I10 Essential (primary) hypertension: Secondary | ICD-10-CM | POA: Diagnosis not present

## 2016-10-14 DIAGNOSIS — M5416 Radiculopathy, lumbar region: Secondary | ICD-10-CM | POA: Diagnosis not present

## 2016-10-14 DIAGNOSIS — M4800 Spinal stenosis, site unspecified: Secondary | ICD-10-CM | POA: Diagnosis not present

## 2016-10-16 DIAGNOSIS — I872 Venous insufficiency (chronic) (peripheral): Secondary | ICD-10-CM | POA: Diagnosis not present

## 2016-10-16 DIAGNOSIS — M4800 Spinal stenosis, site unspecified: Secondary | ICD-10-CM | POA: Diagnosis not present

## 2016-10-16 DIAGNOSIS — M4856XD Collapsed vertebra, not elsewhere classified, lumbar region, subsequent encounter for fracture with routine healing: Secondary | ICD-10-CM | POA: Diagnosis not present

## 2016-10-16 DIAGNOSIS — S80811D Abrasion, right lower leg, subsequent encounter: Secondary | ICD-10-CM | POA: Diagnosis not present

## 2016-10-16 DIAGNOSIS — I10 Essential (primary) hypertension: Secondary | ICD-10-CM | POA: Diagnosis not present

## 2016-10-16 DIAGNOSIS — M5416 Radiculopathy, lumbar region: Secondary | ICD-10-CM | POA: Diagnosis not present

## 2016-10-21 DIAGNOSIS — M4800 Spinal stenosis, site unspecified: Secondary | ICD-10-CM | POA: Diagnosis not present

## 2016-10-21 DIAGNOSIS — M4856XD Collapsed vertebra, not elsewhere classified, lumbar region, subsequent encounter for fracture with routine healing: Secondary | ICD-10-CM | POA: Diagnosis not present

## 2016-10-21 DIAGNOSIS — I10 Essential (primary) hypertension: Secondary | ICD-10-CM | POA: Diagnosis not present

## 2016-10-21 DIAGNOSIS — M5416 Radiculopathy, lumbar region: Secondary | ICD-10-CM | POA: Diagnosis not present

## 2016-10-21 DIAGNOSIS — S80811D Abrasion, right lower leg, subsequent encounter: Secondary | ICD-10-CM | POA: Diagnosis not present

## 2016-10-21 DIAGNOSIS — I872 Venous insufficiency (chronic) (peripheral): Secondary | ICD-10-CM | POA: Diagnosis not present

## 2016-10-23 DIAGNOSIS — I10 Essential (primary) hypertension: Secondary | ICD-10-CM | POA: Diagnosis not present

## 2016-10-23 DIAGNOSIS — I872 Venous insufficiency (chronic) (peripheral): Secondary | ICD-10-CM | POA: Diagnosis not present

## 2016-10-23 DIAGNOSIS — M5416 Radiculopathy, lumbar region: Secondary | ICD-10-CM | POA: Diagnosis not present

## 2016-10-23 DIAGNOSIS — M4800 Spinal stenosis, site unspecified: Secondary | ICD-10-CM | POA: Diagnosis not present

## 2016-10-23 DIAGNOSIS — S80811D Abrasion, right lower leg, subsequent encounter: Secondary | ICD-10-CM | POA: Diagnosis not present

## 2016-10-23 DIAGNOSIS — M4856XD Collapsed vertebra, not elsewhere classified, lumbar region, subsequent encounter for fracture with routine healing: Secondary | ICD-10-CM | POA: Diagnosis not present

## 2016-10-24 DIAGNOSIS — S80811D Abrasion, right lower leg, subsequent encounter: Secondary | ICD-10-CM | POA: Diagnosis not present

## 2016-10-24 DIAGNOSIS — M4800 Spinal stenosis, site unspecified: Secondary | ICD-10-CM | POA: Diagnosis not present

## 2016-10-24 DIAGNOSIS — M5416 Radiculopathy, lumbar region: Secondary | ICD-10-CM | POA: Diagnosis not present

## 2016-10-24 DIAGNOSIS — I10 Essential (primary) hypertension: Secondary | ICD-10-CM | POA: Diagnosis not present

## 2016-10-24 DIAGNOSIS — M4856XD Collapsed vertebra, not elsewhere classified, lumbar region, subsequent encounter for fracture with routine healing: Secondary | ICD-10-CM | POA: Diagnosis not present

## 2016-10-24 DIAGNOSIS — I872 Venous insufficiency (chronic) (peripheral): Secondary | ICD-10-CM | POA: Diagnosis not present

## 2016-10-28 DIAGNOSIS — I872 Venous insufficiency (chronic) (peripheral): Secondary | ICD-10-CM | POA: Diagnosis not present

## 2016-10-28 DIAGNOSIS — M4800 Spinal stenosis, site unspecified: Secondary | ICD-10-CM | POA: Diagnosis not present

## 2016-10-28 DIAGNOSIS — M5416 Radiculopathy, lumbar region: Secondary | ICD-10-CM | POA: Diagnosis not present

## 2016-10-28 DIAGNOSIS — I10 Essential (primary) hypertension: Secondary | ICD-10-CM | POA: Diagnosis not present

## 2016-10-28 DIAGNOSIS — M4856XD Collapsed vertebra, not elsewhere classified, lumbar region, subsequent encounter for fracture with routine healing: Secondary | ICD-10-CM | POA: Diagnosis not present

## 2016-10-28 DIAGNOSIS — S80811D Abrasion, right lower leg, subsequent encounter: Secondary | ICD-10-CM | POA: Diagnosis not present

## 2016-10-31 DIAGNOSIS — I10 Essential (primary) hypertension: Secondary | ICD-10-CM | POA: Diagnosis not present

## 2016-10-31 DIAGNOSIS — M4856XD Collapsed vertebra, not elsewhere classified, lumbar region, subsequent encounter for fracture with routine healing: Secondary | ICD-10-CM | POA: Diagnosis not present

## 2016-10-31 DIAGNOSIS — M4800 Spinal stenosis, site unspecified: Secondary | ICD-10-CM | POA: Diagnosis not present

## 2016-10-31 DIAGNOSIS — S80811D Abrasion, right lower leg, subsequent encounter: Secondary | ICD-10-CM | POA: Diagnosis not present

## 2016-10-31 DIAGNOSIS — M5416 Radiculopathy, lumbar region: Secondary | ICD-10-CM | POA: Diagnosis not present

## 2016-10-31 DIAGNOSIS — I872 Venous insufficiency (chronic) (peripheral): Secondary | ICD-10-CM | POA: Diagnosis not present

## 2016-11-04 DIAGNOSIS — M4800 Spinal stenosis, site unspecified: Secondary | ICD-10-CM | POA: Diagnosis not present

## 2016-11-04 DIAGNOSIS — I10 Essential (primary) hypertension: Secondary | ICD-10-CM | POA: Diagnosis not present

## 2016-11-04 DIAGNOSIS — M4856XD Collapsed vertebra, not elsewhere classified, lumbar region, subsequent encounter for fracture with routine healing: Secondary | ICD-10-CM | POA: Diagnosis not present

## 2016-11-04 DIAGNOSIS — M5416 Radiculopathy, lumbar region: Secondary | ICD-10-CM | POA: Diagnosis not present

## 2016-11-04 DIAGNOSIS — S80811D Abrasion, right lower leg, subsequent encounter: Secondary | ICD-10-CM | POA: Diagnosis not present

## 2016-11-04 DIAGNOSIS — I872 Venous insufficiency (chronic) (peripheral): Secondary | ICD-10-CM | POA: Diagnosis not present

## 2016-11-08 DIAGNOSIS — M5416 Radiculopathy, lumbar region: Secondary | ICD-10-CM | POA: Diagnosis not present

## 2016-11-08 DIAGNOSIS — M6281 Muscle weakness (generalized): Secondary | ICD-10-CM | POA: Diagnosis not present

## 2016-11-08 DIAGNOSIS — M4800 Spinal stenosis, site unspecified: Secondary | ICD-10-CM | POA: Diagnosis not present

## 2016-11-08 DIAGNOSIS — I872 Venous insufficiency (chronic) (peripheral): Secondary | ICD-10-CM | POA: Diagnosis not present

## 2016-11-08 DIAGNOSIS — R609 Edema, unspecified: Secondary | ICD-10-CM | POA: Diagnosis not present

## 2016-11-08 DIAGNOSIS — I10 Essential (primary) hypertension: Secondary | ICD-10-CM | POA: Diagnosis not present

## 2016-11-11 DIAGNOSIS — M4800 Spinal stenosis, site unspecified: Secondary | ICD-10-CM | POA: Diagnosis not present

## 2016-11-11 DIAGNOSIS — I10 Essential (primary) hypertension: Secondary | ICD-10-CM | POA: Diagnosis not present

## 2016-11-11 DIAGNOSIS — M6281 Muscle weakness (generalized): Secondary | ICD-10-CM | POA: Diagnosis not present

## 2016-11-11 DIAGNOSIS — I872 Venous insufficiency (chronic) (peripheral): Secondary | ICD-10-CM | POA: Diagnosis not present

## 2016-11-11 DIAGNOSIS — R609 Edema, unspecified: Secondary | ICD-10-CM | POA: Diagnosis not present

## 2016-11-11 DIAGNOSIS — M5416 Radiculopathy, lumbar region: Secondary | ICD-10-CM | POA: Diagnosis not present

## 2016-11-13 DIAGNOSIS — M5416 Radiculopathy, lumbar region: Secondary | ICD-10-CM | POA: Diagnosis not present

## 2016-11-13 DIAGNOSIS — I872 Venous insufficiency (chronic) (peripheral): Secondary | ICD-10-CM | POA: Diagnosis not present

## 2016-11-13 DIAGNOSIS — I10 Essential (primary) hypertension: Secondary | ICD-10-CM | POA: Diagnosis not present

## 2016-11-13 DIAGNOSIS — M6281 Muscle weakness (generalized): Secondary | ICD-10-CM | POA: Diagnosis not present

## 2016-11-13 DIAGNOSIS — M4800 Spinal stenosis, site unspecified: Secondary | ICD-10-CM | POA: Diagnosis not present

## 2016-11-13 DIAGNOSIS — R609 Edema, unspecified: Secondary | ICD-10-CM | POA: Diagnosis not present

## 2016-11-16 ENCOUNTER — Encounter (HOSPITAL_COMMUNITY): Payer: Self-pay | Admitting: *Deleted

## 2016-11-16 ENCOUNTER — Encounter (HOSPITAL_COMMUNITY): Payer: Self-pay | Admitting: Emergency Medicine

## 2016-11-16 ENCOUNTER — Emergency Department (HOSPITAL_COMMUNITY)
Admission: EM | Admit: 2016-11-16 | Discharge: 2016-11-16 | Disposition: A | Discharge status: Home / Self Care | Payer: Medicare Other | Attending: Emergency Medicine | Admitting: Emergency Medicine

## 2016-11-16 ENCOUNTER — Ambulatory Visit (HOSPITAL_COMMUNITY)
Admission: EM | Admit: 2016-11-16 | Discharge: 2016-11-16 | Disposition: A | Discharge status: Home / Self Care | Payer: Medicare Other | Attending: Emergency Medicine | Admitting: Emergency Medicine

## 2016-11-16 ENCOUNTER — Emergency Department (HOSPITAL_COMMUNITY): Payer: Medicare Other

## 2016-11-16 DIAGNOSIS — M25411 Effusion, right shoulder: Secondary | ICD-10-CM | POA: Diagnosis not present

## 2016-11-16 DIAGNOSIS — L02413 Cutaneous abscess of right upper limb: Secondary | ICD-10-CM | POA: Diagnosis not present

## 2016-11-16 DIAGNOSIS — R609 Edema, unspecified: Secondary | ICD-10-CM

## 2016-11-16 DIAGNOSIS — D649 Anemia, unspecified: Secondary | ICD-10-CM | POA: Diagnosis not present

## 2016-11-16 DIAGNOSIS — M7989 Other specified soft tissue disorders: Secondary | ICD-10-CM | POA: Diagnosis not present

## 2016-11-16 LAB — BASIC METABOLIC PANEL
Anion gap: 9 (ref 5–15)
BUN: 27 mg/dL — AB (ref 6–20)
CHLORIDE: 103 mmol/L (ref 101–111)
CO2: 23 mmol/L (ref 22–32)
CREATININE: 0.9 mg/dL (ref 0.44–1.00)
Calcium: 8.5 mg/dL — ABNORMAL LOW (ref 8.9–10.3)
GFR calc non Af Amer: 52 mL/min — ABNORMAL LOW (ref >60)
GFR, EST AFRICAN AMERICAN: 60 mL/min — AB (ref >60)
Glucose, Bld: 104 mg/dL — ABNORMAL HIGH (ref 65–99)
Potassium: 4.1 mmol/L (ref 3.5–5.1)
Sodium: 135 mmol/L (ref 135–145)

## 2016-11-16 LAB — CBC
HEMATOCRIT: 34.7 % — AB (ref 36.0–46.0)
HEMOGLOBIN: 11.1 g/dL — AB (ref 12.0–15.0)
MCH: 30.1 pg (ref 26.0–34.0)
MCHC: 32 g/dL (ref 30.0–36.0)
MCV: 94 fL (ref 78.0–100.0)
Platelets: 287 10*3/uL (ref 150–400)
RBC: 3.69 MIL/uL — ABNORMAL LOW (ref 3.87–5.11)
RDW: 14 % (ref 11.5–15.5)
WBC: 6.6 10*3/uL (ref 4.0–10.5)

## 2016-11-16 MED ORDER — IOPAMIDOL (ISOVUE-300) INJECTION 61%
INTRAVENOUS | Status: AC
  Administered 2016-11-16: 100 mL
  Filled 2016-11-16: qty 100

## 2016-11-16 NOTE — ED Triage Notes (Signed)
Pt stays at [facility]. Per pt and family member, having swelling and redness noted to right arm. Denies injury. Began near [facility] then swelling spread down her arm, today is up to her shoulder. Denies fever.

## 2016-11-16 NOTE — Progress Notes (Signed)
Pt 1st IV blew when injected saline  RN, Lennette Bihari came right over and put in another one so could do the scan

## 2016-11-16 NOTE — ED Provider Notes (Signed)
[place]    CSN: [id number] Arrival date & time: 11/16/16  1557     History   Chief Complaint Chief Complaint  Patient presents with  . Follow-up    HPI Rosalva Tetzlaff is a 82 y.o. female.   HPI  She is an otherwise healthy 82 year old woman here for follow-up of right arm issue. She states on Monday she had a small bump on her right arm. It has been gradually getting larger and more painful. Pain is worse with moving both the shoulder and the elbow. She was seen at a walk-in clinic in [facility] yesterday and diagnosed with possible cellulitis, but no antibiotics were given. Denies any fevers.  History reviewed. No pertinent past medical history.  There are no active problems to display for this patient.   History reviewed. No pertinent surgical history.  OB History    No data available       Home Medications    Prior to Admission medications   Medication Sig Start Date End Date Taking? Authorizing Provider  HYDROcodone-acetaminophen (NORCO/VICODIN) 5-325 MG tablet Take 1 tablet by mouth every 6 (six) hours as needed for moderate pain.   Yes Historical Provider, MD  metoprolol succinate (TOPROL-XL) 25 MG 24 hr tablet Take 25 mg by mouth daily.   Yes Historical Provider, MD    Family History History reviewed. No pertinent family history.  Social History Social History  Substance Use Topics  . Smoking status: Never Smoker  . Smokeless tobacco: Never Used  . Alcohol use No     Allergies   Keflex [cephalexin]   Review of Systems Review of Systems As in history of present illness  Physical Exam Triage Vital Signs ED Triage Vitals [11/16/16 1625]  Enc Vitals Group     BP 161/62     Pulse Rate 74     Resp 20     Temp 98.3 F (36.8 C)     Temp Source Oral     SpO2 98 %     Weight      Height      Head Circumference      Peak Flow      Pain Score      Pain Loc      Pain Edu?      Excl. in [place]?    No data found.   Updated Vital  Signs BP 161/62 (BP Location: Left Arm)   Pulse 74   Temp 98.3 F (36.8 C) (Oral)   Resp 20   SpO2 98%   Visual Acuity Right Eye Distance:   Left Eye Distance:   Bilateral Distance:    Right Eye Near:   Left Eye Near:    Bilateral Near:     Physical Exam  Constitutional: She is oriented to person, place, and time. She appears well-developed and well-nourished. No distress.  Cardiovascular: Normal rate.   Pulmonary/Chest: Effort normal.  Neurological: She is alert and oriented to person, place, and time.  Skin:  She has a 5 cm mass on the right anterior shoulder. It has some central fluctuance, suggesting that this is an abscess. It is warm to touch and tender. It appears to extend slightly into the axilla. She also has some surrounding redness. The entire right arm is swollen with pitting edema. She has pain with activation of the biceps muscle. She also has pain with range of motion of the right shoulder. 2+ radial pulse.     UC Treatments / Results  Labs (all labs ordered are listed, but only abnormal results are displayed) Labs Reviewed - No data to display  EKG  EKG Interpretation None       Radiology No results found.  Procedures Procedures (including critical care time)  Medications Ordered in UC Medications - No data to display   Initial Impression / Assessment and Plan / UC Course  I have reviewed the triage vital signs and the nursing notes.  Pertinent labs & imaging results that were available during my care of the patient were reviewed by me and considered in my medical decision making (see chart for details).  Clinical Course     Discussed with patient and her daughter-in-law that this is likely an abscess with surrounding infection. Given the location of it and the pain with range of motion, I'm concerned for involvement of the shoulder joint. We'll transfer to [facility] emergency room via private vehicle for additional workup and management.  Given her age, I suspect she needs at least 1 dose of IV antibiotics. She may need further imaging to assess the extent of the abscess.  Final Clinical Impressions(s) / UC Diagnoses   Final diagnoses:  Abscess of arm, right    New Prescriptions New Prescriptions   No medications on file     Melony Overly, MD 11/16/16 1656

## 2016-11-16 NOTE — Discharge Instructions (Signed)
You have what appears to be a large abscess on the front of your right shoulder. I'm concerned that the infection involves the shoulder joint given that your arm is swollen. Please go directly to the emergency room for additional evaluation and management. You will likely need at least 1 dose of IV antibiotics before you can go home.

## 2016-11-16 NOTE — ED Provider Notes (Addendum)
[place] DEPT Provider Note   CSN: [id number] Arrival date & time: 11/16/16  1700     History   Chief Complaint Chief Complaint  Patient presents with  . Arm Pain  . Cellulitis    HPI Kwanza Dao is a 82 y.o. female.Patient complains of right arm pain and swelling onset 5 days ago. Pain is most pronounced at shoulder and is exacerbated by moving her shoulder improved with remaining still. Pain is minimal. No fever no nausea no vomiting no other associated symptoms. She's been evaluated at [facility] urgent care earlier today . There was question of abscess, sent here for further evaluation. No treatment prior to coming here  HPI  History reviewed. No pertinent past medical history. Past medical history hypertension There are no active problems to display for this patient.   History reviewed. No pertinent surgical history.  OB History    No data available       Home Medications    Prior to Admission medications   Medication Sig Start Date End Date Taking? Authorizing Provider  HYDROcodone-acetaminophen (NORCO/VICODIN) 5-325 MG tablet Take 1 tablet by mouth every 6 (six) hours as needed for moderate pain.   Yes Historical Provider, MD  metoprolol succinate (TOPROL-XL) 25 MG 24 hr tablet Take 25 mg by mouth daily.   Yes Historical Provider, MD  naproxen sodium (ANAPROX) 220 MG tablet Take 220 mg by mouth daily as needed (pain).   Yes Historical Provider, MD    Family History History reviewed. No pertinent family history.  Social History Social History  Substance Use Topics  . Smoking status: Never Smoker  . Smokeless tobacco: Never Used  . Alcohol use No     Allergies   Keflex [cephalexin]   Review of Systems Review of Systems  Musculoskeletal: Positive for arthralgias.       Mild shoulder pain and swelling     Physical Exam Updated Vital Signs BP 161/64 (BP Location: Left Arm)   Pulse 75   Temp 98.2 F (36.8 C) (Oral)   SpO2 97%   Physical Exam    Constitutional: She appears well-developed and well-nourished. No distress.  HENT:  Head: Normocephalic and atraumatic.  Eyes: Conjunctivae are normal. Pupils are equal, round, and reactive to light.  Neck: Neck supple. No tracheal deviation present. No thyromegaly present.  Cardiovascular: Normal rate, regular rhythm and intact distal pulses.   No murmur heard. Pulmonary/Chest: Effort normal and breath sounds normal.  Abdominal: Soft. Bowel sounds are normal. She exhibits no distension. There is no tenderness.  Musculoskeletal: Normal range of motion. She exhibits no edema or tenderness.  rightupper extremity  swollen at anterior shoulder. Immediately distal to shoulder biceps area appears ecchymotic. There is not warm or tender. Dorsum of hand is minimally swollen. Full range of motion of upper extremity. Radial pulse 2+. Good capillary refill. Left upper extremity without redness swelling or tenderness. Bilateral lower extremities with trace pretibial edema  Neurological: She is alert. Coordination normal.  Skin: Skin is warm and dry. No rash noted.  Psychiatric: She has a normal mood and affect.  Nursing note and vitals reviewed.    ED Treatments / Results  Labs (all labs ordered are listed, but only abnormal results are displayed) Labs Reviewed  CBC - Abnormal; Notable for the following:       Result Value   RBC 3.69 (*)    Hemoglobin 11.1 (*)    HCT 34.7 (*)    All other components within normal limits  BASIC METABOLIC PANEL - Abnormal; Notable for the following:    Glucose, Bld 104 (*)    BUN 27 (*)    Calcium 8.5 (*)    GFR calc non Af Amer 52 (*)    GFR calc Af Amer 60 (*)    All other components within normal limits    EKG  EKG Interpretation None       Radiology No results found.  Procedures Procedures (including critical care time)  Medications Ordered in ED Medications  iopamidol (ISOVUE-300) 61 % injection (not administered)   Results for orders  placed or performed during the hospital encounter of 11/16/16  CBC  Result Value Ref Range   WBC 6.6 4.0 - 10.5 K/uL   RBC 3.69 (L) 3.87 - 5.11 MIL/uL   Hemoglobin 11.1 (L) 12.0 - 15.0 g/dL   HCT 34.7 (L) 36.0 - 46.0 %   MCV 94.0 78.0 - 100.0 fL   MCH 30.1 26.0 - 34.0 pg   MCHC 32.0 30.0 - 36.0 g/dL   RDW 14.0 11.5 - 15.5 %   Platelets 287 150 - 400 K/uL  Basic metabolic panel  Result Value Ref Range   Sodium 135 135 - 145 mmol/L   Potassium 4.1 3.5 - 5.1 mmol/L   Chloride 103 101 - 111 mmol/L   CO2 23 22 - 32 mmol/L   Glucose, Bld 104 (H) 65 - 99 mg/dL   BUN 27 (H) 6 - 20 mg/dL   Creatinine, Ser 0.90 0.44 - 1.00 mg/dL   Calcium 8.5 (L) 8.9 - 10.3 mg/dL   GFR calc non Af Amer 52 (L) >60 mL/min   GFR calc Af Amer 60 (L) >60 mL/min   Anion gap 9 5 - 15   Ct Shoulder Right W Contrast  Result Date: 11/16/2016 CLINICAL DATA:  PT WOKE UP THIS AM WITH REDNESS AND SWELLING RT SHOULDER AROUND HUMERAL HEAD DOWN INTO BICEP THE SWELLING IS ALMOST LIKE A LONG "KNOT"NKINOT DIABETIC EXAM: CT OF THE UPPER RIGHT EXTREMITY WITH CONTRAST TECHNIQUE: Multidetector CT imaging of the upper right extremity was performed according to the standard protocol following intravenous contrast administration. COMPARISON:  None. CONTRAST:  141mL ISOVUE-300 IOPAMIDOL (ISOVUE-300) INJECTION 61%<Contrast>147mL ISOVUE-300 IOPAMIDOL (ISOVUE-300) INJECTION 61% FINDINGS: Bones/Joint/Cartilage No acute fracture. No bone lesion. Humeral head resides directly underneath the acromion which shows concave remodeling as well as a prominent spur. There is narrowing of the glenohumeral joint superiorly with subchondral sclerosis and marginal osteophytes. Bones are diffusely demineralized. There are moderate AC joint osteoarthritic changes. Ligaments There are chronic full-thickness tears of the supra and infraspinatus tendons. This allows the humeral head to migrates superiorly to abut the undersurface of the acromion. Soft tissues There  is a fluid collection at extends from the superior aspect of the anterior deltoid inferiorly to the subcutaneous fat of the anterior upper arm. This measures approximately 10 cm x 3.9 x 2.4 cm. There is adjacent inflammatory type stranding in the fat. There is a small amount of fluid also within the pectoralis minor muscle. IMPRESSION: 1. Irregular fluid collection extends from the anterior shoulder to the anterior upper arm along the superficial margin of the anterior deltoid. It measures 10 cm in greatest dimension. This may reflect an abscess. This could be from a tear of the anterior deltoid. 2. No other acute abnormality. 3. Chronic full-thickness tears of the rotator cuff with superior retraction of the humeral head abutting the undersurface of the acromion where there is a subacromial spur. 4. Glenohumeral  and AC joint degenerative changes. Electronically Signed   By: Lajean Manes M.D.   On: 11/16/2016 21:13     Initial Impression / Assessment and Plan / ED Course  I have reviewed the triage vital signs and the nursing notes.  Pertinent labs & imaging results that were available during my care of the patient were reviewed by me and considered in my medical decision making (see chart for details).  Clinical Course     Case discussed with Dr. supple who reviewed CT scan as well as photographs. Septic joint or abscess highly unlikely. Dr. supple feels effusion is secondary to chronic arthritis and chronic rotator cuff injury. No further intervention needed tonight. I'm in agreement. She can follow-up the office next week  Final Clinical Impressions(s) / ED Diagnoses  #1Right shoulder effusion #2 normocytic anemia Final diagnoses:  Swelling    New Prescriptions New Prescriptions   No medications on file     Orlie Dakin, MD 11/16/16 2151    Orlie Dakin, MD 11/16/16 2152

## 2016-11-16 NOTE — ED Provider Notes (Signed)
12/02: Right upper extremity          [facility] Ward, PA-C 11/16/16 [place], MD 11/16/16 [phone]

## 2016-11-16 NOTE — Discharge Instructions (Signed)
Call [facility] orthopedics office on Monday, 11/18/2016 to arrange to be seen in the office next week. It is okay to take Tylenol as directed for pain

## 2016-11-16 NOTE — ED Triage Notes (Signed)
Here for a f/u of cellulitis to RUE... Reports the redness has subsided but has a small nodule on right shoulder that is a concern to her.   Reports no meds given yesterday by another Urgent Care  Wheelchair present upon arrival... Son is at bedside...   A&O x4... NAD

## 2016-11-18 DIAGNOSIS — I872 Venous insufficiency (chronic) (peripheral): Secondary | ICD-10-CM | POA: Diagnosis not present

## 2016-11-18 DIAGNOSIS — I10 Essential (primary) hypertension: Secondary | ICD-10-CM | POA: Diagnosis not present

## 2016-11-18 DIAGNOSIS — M6281 Muscle weakness (generalized): Secondary | ICD-10-CM | POA: Diagnosis not present

## 2016-11-18 DIAGNOSIS — M4800 Spinal stenosis, site unspecified: Secondary | ICD-10-CM | POA: Diagnosis not present

## 2016-11-18 DIAGNOSIS — R609 Edema, unspecified: Secondary | ICD-10-CM | POA: Diagnosis not present

## 2016-11-18 DIAGNOSIS — M5416 Radiculopathy, lumbar region: Secondary | ICD-10-CM | POA: Diagnosis not present

## 2016-11-19 DIAGNOSIS — M25511 Pain in right shoulder: Secondary | ICD-10-CM | POA: Diagnosis not present

## 2016-11-19 DIAGNOSIS — M12811 Other specific arthropathies, not elsewhere classified, right shoulder: Secondary | ICD-10-CM | POA: Diagnosis not present

## 2016-11-20 DIAGNOSIS — M5416 Radiculopathy, lumbar region: Secondary | ICD-10-CM | POA: Diagnosis not present

## 2016-11-20 DIAGNOSIS — R609 Edema, unspecified: Secondary | ICD-10-CM | POA: Diagnosis not present

## 2016-11-20 DIAGNOSIS — M6281 Muscle weakness (generalized): Secondary | ICD-10-CM | POA: Diagnosis not present

## 2016-11-20 DIAGNOSIS — I872 Venous insufficiency (chronic) (peripheral): Secondary | ICD-10-CM | POA: Diagnosis not present

## 2016-11-20 DIAGNOSIS — M4800 Spinal stenosis, site unspecified: Secondary | ICD-10-CM | POA: Diagnosis not present

## 2016-11-20 DIAGNOSIS — I10 Essential (primary) hypertension: Secondary | ICD-10-CM | POA: Diagnosis not present

## 2016-11-25 DIAGNOSIS — I10 Essential (primary) hypertension: Secondary | ICD-10-CM | POA: Diagnosis not present

## 2016-11-25 DIAGNOSIS — M5416 Radiculopathy, lumbar region: Secondary | ICD-10-CM | POA: Diagnosis not present

## 2016-11-25 DIAGNOSIS — I872 Venous insufficiency (chronic) (peripheral): Secondary | ICD-10-CM | POA: Diagnosis not present

## 2016-11-25 DIAGNOSIS — M4800 Spinal stenosis, site unspecified: Secondary | ICD-10-CM | POA: Diagnosis not present

## 2016-11-25 DIAGNOSIS — R609 Edema, unspecified: Secondary | ICD-10-CM | POA: Diagnosis not present

## 2016-11-25 DIAGNOSIS — M6281 Muscle weakness (generalized): Secondary | ICD-10-CM | POA: Diagnosis not present

## 2016-11-27 DIAGNOSIS — I872 Venous insufficiency (chronic) (peripheral): Secondary | ICD-10-CM | POA: Diagnosis not present

## 2016-11-27 DIAGNOSIS — M5416 Radiculopathy, lumbar region: Secondary | ICD-10-CM | POA: Diagnosis not present

## 2016-11-27 DIAGNOSIS — I10 Essential (primary) hypertension: Secondary | ICD-10-CM | POA: Diagnosis not present

## 2016-11-27 DIAGNOSIS — M4800 Spinal stenosis, site unspecified: Secondary | ICD-10-CM | POA: Diagnosis not present

## 2016-11-27 DIAGNOSIS — R609 Edema, unspecified: Secondary | ICD-10-CM | POA: Diagnosis not present

## 2016-11-27 DIAGNOSIS — M6281 Muscle weakness (generalized): Secondary | ICD-10-CM | POA: Diagnosis not present

## 2016-12-03 DIAGNOSIS — M4800 Spinal stenosis, site unspecified: Secondary | ICD-10-CM | POA: Diagnosis not present

## 2016-12-03 DIAGNOSIS — I10 Essential (primary) hypertension: Secondary | ICD-10-CM | POA: Diagnosis not present

## 2016-12-03 DIAGNOSIS — M6281 Muscle weakness (generalized): Secondary | ICD-10-CM | POA: Diagnosis not present

## 2016-12-03 DIAGNOSIS — R609 Edema, unspecified: Secondary | ICD-10-CM | POA: Diagnosis not present

## 2016-12-03 DIAGNOSIS — M5416 Radiculopathy, lumbar region: Secondary | ICD-10-CM | POA: Diagnosis not present

## 2016-12-03 DIAGNOSIS — I872 Venous insufficiency (chronic) (peripheral): Secondary | ICD-10-CM | POA: Diagnosis not present

## 2016-12-31 ENCOUNTER — Ambulatory Visit (INDEPENDENT_AMBULATORY_CARE_PROVIDER_SITE_OTHER): Payer: Medicare Other | Admitting: Physical Medicine and Rehabilitation

## 2016-12-31 ENCOUNTER — Ambulatory Visit (INDEPENDENT_AMBULATORY_CARE_PROVIDER_SITE_OTHER): Payer: Self-pay

## 2016-12-31 ENCOUNTER — Encounter (INDEPENDENT_AMBULATORY_CARE_PROVIDER_SITE_OTHER): Payer: Self-pay | Admitting: Physical Medicine and Rehabilitation

## 2016-12-31 ENCOUNTER — Encounter (INDEPENDENT_AMBULATORY_CARE_PROVIDER_SITE_OTHER): Payer: Self-pay

## 2016-12-31 VITALS — BP 146/69 | HR 59

## 2016-12-31 DIAGNOSIS — G8929 Other chronic pain: Secondary | ICD-10-CM

## 2016-12-31 DIAGNOSIS — M47816 Spondylosis without myelopathy or radiculopathy, lumbar region: Secondary | ICD-10-CM

## 2016-12-31 DIAGNOSIS — M48062 Spinal stenosis, lumbar region with neurogenic claudication: Secondary | ICD-10-CM

## 2016-12-31 DIAGNOSIS — S32050S Wedge compression fracture of fifth lumbar vertebra, sequela: Secondary | ICD-10-CM | POA: Diagnosis not present

## 2016-12-31 DIAGNOSIS — M7062 Trochanteric bursitis, left hip: Secondary | ICD-10-CM

## 2016-12-31 DIAGNOSIS — M5442 Lumbago with sciatica, left side: Secondary | ICD-10-CM | POA: Diagnosis not present

## 2016-12-31 NOTE — Progress Notes (Signed)
Vanessa Knox - 83 y.o. female MRN [id number]  Date of birth: 1916/12/06  Office Visit Note: Visit Date: 12/31/2016 PCP: Rachell Cipro, MD Referred by: Fanny Bien, MD  Subjective: Chief Complaint  Patient presents with  . Lower Back - Pain   HPI: Vanessa Knox is a pleasant and alert 83 year old female presents today in a wheelchair with her son and daughter or daughter-in-law believe. They do provide some of the history but she is very alert and oriented and tells me about her current condition. She evidently had a kyphoplasty performed in March 2017 in [place] where she was living up until just recently. She had an MRI prior to that which is reviewed below. Basically this showed severe multifactorial stenosis at L4-5 and the wedge compression fracture at L5 without retropulsion. She had degenerative disc disease throughout which is normal for aging especially at 83 years old. She reports mainly low back pain with pain to the left thigh over the greater trochanter with some radiating into the thigh. Sometimes on the right but mainly the left. She does not report tingling numbness or paresthesia. She says the sharp pain will go to the knee but not below the knee. They did discuss prior back surgery but really it was the kyphoplasty and there was no surgery performed for the stenosis. She has not had any focal weakness but feels weak in general. Her back pain has been quite significant. She's been on hydrocodone. She doesn't really like to take it. She rates her pain is severe and limiting. It's worse when she tries to lay on that side. From a general health standpoint she's doing fairly well to be 83 years old. She does take some medicine for hypertension and that's about it. She's had no recent falls or trauma otherwise. She has not had any imaging since the kyphoplasty. She has some sort of treatment performed after the kyphoplasty but is unsure with talking to them whether this was an epidural  or some kind other treatment in the office.    Review of Systems  Constitutional: Negative for chills, fever, malaise/fatigue and weight loss.  HENT: Negative for hearing loss and sinus pain.   Eyes: Negative for blurred vision, double vision and photophobia.  Respiratory: Negative for cough and shortness of breath.   Cardiovascular: Negative for chest pain, palpitations and leg swelling.  Gastrointestinal: Negative for abdominal pain, nausea and vomiting.  Genitourinary: Negative for flank pain.  Musculoskeletal: Positive for back pain. Negative for myalgias.  Skin: Negative for itching and rash.  Neurological: Negative for tremors, focal weakness and weakness.  Endo/Heme/Allergies: Negative.   Psychiatric/Behavioral: Negative for depression.  All other systems reviewed and are negative.  Otherwise per HPI. Assessment & Plan: Visit Diagnoses:  1. Chronic low back pain with left-sided sciatica, unspecified back pain laterality   2. Spondylosis without myelopathy or radiculopathy, lumbar region   3. Trochanteric bursitis, left hip   4. Spinal stenosis of lumbar region with neurogenic claudication   5. Compression fracture of L5 lumbar vertebra, sequela     Plan: Findings:  Chronic history of a year of really low back pain with potentially radicular pain in the hips and legs versus maybe even facet mediated low back pain. She clearly has severe stenosis at L4-5 which was not corrected by any surgery. The kyphoplasty was performed for the compression fracture at L5-S1. She also has pain consistent with a greater trochanteric bursitis. Does a lot of pain with palpation along that area. Today  due to the severity of her symptoms and just from a diagnostic standpoint we will complete a greater trochanteric bursa injection as described in the notes below. Depending on that relief, go ahead and schedule her for an epidural injection which would either be an L4 transforaminal injection or L5-S1  intralaminar injection. Hopefully one of these interventions is diagnostic and therapeutic. At would like to get her off the hydrocodone as that would probably be something that would exacerbate any falls and clearly on the x-ray she is probably fairly constipated from the hydrocodone use. I spent more than 30 minutes speaking face-to-face with the patient with 50% of the time in counseling.    Meds & Orders: No orders of the defined types were placed in this encounter.   Orders Placed This Encounter  Procedures  . Large Joint Injection/Arthrocentesis  . XR Lumbar Spine 2-3 Views    Follow-up: Return for Scheduled lumbar intralaminar epidural steroid injection depending on relief of today's injection.   Procedures: Rader trochanteric injection patient seemed to have some decreased pain to palpation over the area after the injection. Date/Time: 01/02/2017 4:09 PM Performed by: Magnus Sinning Authorized by: Magnus Sinning   Consent Given by:  Parent Site marked: the procedure site was marked   Timeout: prior to procedure the correct patient, procedure, and site was verified   Indications:  Pain and diagnostic evaluation Location:  Hip Site:  L greater trochanter Prep: patient was prepped and draped in usual sterile fashion   Needle Size:  22 G Needle Length:  3.5 inches Approach:  Lateral Ultrasound Guidance: No   Fluoroscopic Guidance: No   Arthrogram: No   Medications:  80 mg triamcinolone acetonide 40 MG/ML; 4 mL lidocaine 2 %; 4 mL bupivacaine 0.25 % Aspiration Attempted: No   Patient tolerance:  Patient tolerated the procedure well with no immediate complications  Patient seemed to have some decreased pain to palpation over the area tolerated the procedure well.    No notes on file   Clinical History: Lumbar spine MRI dated March 2017 shows multilevel degenerative disc height loss with endplate changes but otherwise fairly normal anatomic alignment. There is the wedge  compression fracture at L5-S1. There is also multifactorial severe stenosis at L4-5. There is facet arthropathy throughout.  She reports that she has never smoked. She has never used smokeless tobacco. No results for input(s): HGBA1C, LABURIC in the last 8760 hours.  Objective:  VS:  HT:    WT:   BMI:     BP:(!) 146/69  HR:(!) 59bpm  TEMP: ( )  RESP:  Physical Exam  Constitutional: She is oriented to person, place, and time. She appears well-developed and well-nourished. No distress.  HENT:  Head: Normocephalic and atraumatic.  Nose: Nose normal.  Mouth/Throat: Oropharynx is clear and moist.  Eyes: Conjunctivae are normal. Pupils are equal, round, and reactive to light.  Neck: Normal range of motion. Neck supple.  Cardiovascular: Normal rate, regular rhythm and intact distal pulses.   Pulmonary/Chest: Effort normal and breath sounds normal.  Abdominal: Soft. She exhibits no distension.  Musculoskeletal:  The patient was seated in a wheelchair but with good strength with thigh extension knee extension dorsiflexion plantarflexion EHL. She had no clonus. No pain with hip rotation. Pain with extension and she is very slow to rise from seated position to get on the exam table.  Neurological: She is alert and oriented to person, place, and time. No sensory deficit. She exhibits normal muscle tone.  Skin: Skin is warm. No rash noted. No erythema.  Psychiatric: She has a normal mood and affect. Her behavior is normal.  Nursing note and vitals reviewed.   Ortho Exam Imaging: No results found.  Past Medical/Family/Surgical/Social History: Medications & Allergies reviewed per EMR Patient Active Problem List   Diagnosis Date Noted  . Compression fracture of L5 lumbar vertebra, sequela 01/02/2017  . Spinal stenosis of lumbar region with neurogenic claudication 01/02/2017  . Chronic low back pain with left-sided sciatica 01/02/2017   History reviewed. No pertinent past medical  history. History reviewed. No pertinent family history. History reviewed. No pertinent surgical history. Social History   Occupational History  . Not on file.   Social History Main Topics  . Smoking status: Never Smoker  . Smokeless tobacco: Never Used  . Alcohol use No  . Drug use: Unknown  . Sexual activity: Not on file

## 2017-01-02 DIAGNOSIS — M7062 Trochanteric bursitis, left hip: Secondary | ICD-10-CM | POA: Diagnosis not present

## 2017-01-02 DIAGNOSIS — S32050S Wedge compression fracture of fifth lumbar vertebra, sequela: Secondary | ICD-10-CM | POA: Insufficient documentation

## 2017-01-02 DIAGNOSIS — G8929 Other chronic pain: Secondary | ICD-10-CM | POA: Insufficient documentation

## 2017-01-02 DIAGNOSIS — M5442 Lumbago with sciatica, left side: Secondary | ICD-10-CM

## 2017-01-02 DIAGNOSIS — M48062 Spinal stenosis, lumbar region with neurogenic claudication: Secondary | ICD-10-CM | POA: Insufficient documentation

## 2017-01-02 MED ORDER — TRIAMCINOLONE ACETONIDE 40 MG/ML IJ SUSP
80.0000 mg | INTRAMUSCULAR | Status: AC | PRN
  Administered 2017-01-02: 80 mg via INTRA_ARTICULAR

## 2017-01-02 MED ORDER — LIDOCAINE HCL 2 % IJ SOLN
4.0000 mL | INTRAMUSCULAR | Status: AC | PRN
  Administered 2017-01-02: 4 mL

## 2017-01-02 MED ORDER — BUPIVACAINE HCL 0.25 % IJ SOLN
4.0000 mL | INTRAMUSCULAR | Status: AC | PRN
  Administered 2017-01-02: 4 mL via INTRA_ARTICULAR

## 2017-01-14 ENCOUNTER — Encounter (INDEPENDENT_AMBULATORY_CARE_PROVIDER_SITE_OTHER): Payer: Self-pay | Admitting: Physical Medicine and Rehabilitation

## 2017-01-14 ENCOUNTER — Ambulatory Visit (INDEPENDENT_AMBULATORY_CARE_PROVIDER_SITE_OTHER): Payer: Self-pay

## 2017-01-14 ENCOUNTER — Ambulatory Visit (INDEPENDENT_AMBULATORY_CARE_PROVIDER_SITE_OTHER): Payer: Medicare Other | Admitting: Physical Medicine and Rehabilitation

## 2017-01-14 VITALS — BP 136/66 | HR 82 | Temp 97.8°F

## 2017-01-14 DIAGNOSIS — M5416 Radiculopathy, lumbar region: Secondary | ICD-10-CM

## 2017-01-14 DIAGNOSIS — M48062 Spinal stenosis, lumbar region with neurogenic claudication: Secondary | ICD-10-CM

## 2017-01-14 MED ORDER — LIDOCAINE HCL (PF) 1 % IJ SOLN
0.3300 mL | Freq: Once | INTRAMUSCULAR | Status: AC
  Administered 2017-01-14: 0.3 mL

## 2017-01-14 MED ORDER — METHYLPREDNISOLONE ACETATE 80 MG/ML IJ SUSP
80.0000 mg | Freq: Once | INTRAMUSCULAR | Status: AC
  Administered 2017-01-14: 80 mg

## 2017-01-14 NOTE — Progress Notes (Signed)
Vanessa Knox - 83 y.o. female MRN [id number]  Date of birth: 1916/05/23  Office Visit Note: Visit Date: 01/14/2017 PCP: Pcp Not In System Referred by: Fanny Bien, MD  Subjective: Chief Complaint  Patient presents with  . Lower Back - Pain   HPI: Vanessa Knox is a pleasant 83 year old female who returns today for left L5-S1 intralaminar epidural steroid injection. She did get some relief initially with greater trochanteric injection but the pain has returned and is still pretty problematic in the left buttock and thigh. She gets some pain on the right but it's predominantly left. Please see our last note for further evaluation and management and further details and justification for epidural injection.    ROS Otherwise per HPI.  Assessment & Plan: Visit Diagnoses:  1. Lumbar radiculopathy   2. Spinal stenosis of lumbar region with neurogenic claudication     Plan: Findings:  Left L5-S1 interlaminar epidural steroid injection. Depending on relief would look a transforaminal approach at L4-5 where she has severe stenosis.    Meds & Orders:  Meds ordered this encounter  Medications  . lidocaine (PF) (XYLOCAINE) 1 % injection 0.3 mL  . methylPREDNISolone acetate (DEPO-MEDROL) injection 80 mg    Orders Placed This Encounter  Procedures  . XR C-ARM NO REPORT  . Epidural Steroid injection    Follow-up: Return in about 3 weeks (around 02/04/2017).   Procedures: No procedures performed  Lumbar Epidural Steroid Injection - Interlaminar Approach with Fluoroscopic Guidance  Patient: Vanessa Knox      Date of Birth: 20-May-1916 MRN: [id number] PCP: Pcp Not In System      Visit Date: 01/14/2017   Universal Protocol:    Date/Time: 01/31/185:45 AM  Consent Given By: the patient  Position: PRONE  Additional Comments: Vital signs were monitored before and after the procedure. Patient was prepped and draped in the usual sterile fashion. The correct patient, procedure, and  site was verified.   Injection Procedure Details:  Procedure Site One Meds Administered:  Meds ordered this encounter  Medications  . lidocaine (PF) (XYLOCAINE) 1 % injection 0.3 mL  . methylPREDNISolone acetate (DEPO-MEDROL) injection 80 mg     Laterality: Left  Location/Site:  L5-S1  Needle size: 20 G  Needle type: Tuohy  Needle Placement: Paramedian epidural  Findings:  -Contrast Used: 1 mL iohexol 180 mg iodine/mL   -Comments: Excellent flow of contrast into the epidural space.  Procedure Details: Using a paramedian approach from the side mentioned above, the region overlying the inferior lamina was localized under fluoroscopic visualization and the soft tissues overlying this structure were infiltrated with 4 ml. of 1% Lidocaine without Epinephrine. The Tuohy needle was inserted into the epidural space using a paramedian approach.   The epidural space was localized using loss of resistance along with lateral and bi-planar fluoroscopic views.  After negative aspirate for air, blood, and CSF, a 2 ml. volume of Isovue-250 was injected into the epidural space and the flow of contrast was observed. Radiographs were obtained for documentation purposes.    The injectate was administered into the level noted above.   Additional Comments:  The patient tolerated the procedure well Dressing: Band-Aid    Post-procedure details: Patient was observed during the procedure. Post-procedure instructions were reviewed.  Patient left the clinic in stable condition.  2   Clinical History: Lumbar spine MRI dated March 2017 shows multilevel degenerative disc height loss with endplate changes but otherwise fairly normal anatomic alignment. There is the wedge  compression fracture at L5-S1. There is also multifactorial severe stenosis at L4-5. There is facet arthropathy throughout.  She reports that she has never smoked. She has never used smokeless tobacco. No results for input(s):  HGBA1C, LABURIC in the last 8760 hours.  Objective:  VS:  HT:    WT:   BMI:     BP:136/66  HR:82bpm  TEMP:97.8 F (36.6 C)(Oral)  RESP:98 % Physical Exam  Musculoskeletal:  She has good distal strength. She is ambulating in a wheelchair. She can't stand under her own power.    Ortho Exam Imaging: Xr C-arm No Report  Result Date: 01/14/2017 Please see Notes or Procedures tab for imaging impression.   Past Medical/Family/Surgical/Social History: Medications & Allergies reviewed per EMR Patient Active Problem List   Diagnosis Date Noted  . Compression fracture of L5 lumbar vertebra, sequela 01/02/2017  . Spinal stenosis of lumbar region with neurogenic claudication 01/02/2017  . Chronic low back pain with left-sided sciatica 01/02/2017   History reviewed. No pertinent past medical history. History reviewed. No pertinent family history. History reviewed. No pertinent surgical history. Social History   Occupational History  . Not on file.   Social History Main Topics  . Smoking status: Never Smoker  . Smokeless tobacco: Never Used  . Alcohol use No  . Drug use: Unknown  . Sexual activity: Not on file

## 2017-01-14 NOTE — Patient Instructions (Signed)

## 2017-01-15 NOTE — Procedures (Signed)
Lumbar Epidural Steroid Injection - Interlaminar Approach with Fluoroscopic Guidance  Patient: Vanessa Knox      Date of Birth: 02/13/1916 MRN: [id number] PCP: Pcp Not In System      Visit Date: 01/14/2017   Universal Protocol:    Date/Time: 01/31/185:45 AM  Consent Given By: the patient  Position: PRONE  Additional Comments: Vital signs were monitored before and after the procedure. Patient was prepped and draped in the usual sterile fashion. The correct patient, procedure, and site was verified.   Injection Procedure Details:  Procedure Site One Meds Administered:  Meds ordered this encounter  Medications  . lidocaine (PF) (XYLOCAINE) 1 % injection 0.3 mL  . methylPREDNISolone acetate (DEPO-MEDROL) injection 80 mg     Laterality: Left  Location/Site:  L5-S1  Needle size: 20 G  Needle type: Tuohy  Needle Placement: Paramedian epidural  Findings:  -Contrast Used: 1 mL iohexol 180 mg iodine/mL   -Comments: Excellent flow of contrast into the epidural space.  Procedure Details: Using a paramedian approach from the side mentioned above, the region overlying the inferior lamina was localized under fluoroscopic visualization and the soft tissues overlying this structure were infiltrated with 4 ml. of 1% Lidocaine without Epinephrine. The Tuohy needle was inserted into the epidural space using a paramedian approach.   The epidural space was localized using loss of resistance along with lateral and bi-planar fluoroscopic views.  After negative aspirate for air, blood, and CSF, a 2 ml. volume of Isovue-250 was injected into the epidural space and the flow of contrast was observed. Radiographs were obtained for documentation purposes.    The injectate was administered into the level noted above.   Additional Comments:  The patient tolerated the procedure well Dressing: Band-Aid    Post-procedure details: Patient was observed during the procedure. Post-procedure  instructions were reviewed.  Patient left the clinic in stable condition.  2

## 2017-01-30 ENCOUNTER — Ambulatory Visit (INDEPENDENT_AMBULATORY_CARE_PROVIDER_SITE_OTHER): Payer: Medicare Other | Admitting: Physical Medicine and Rehabilitation

## 2017-01-30 ENCOUNTER — Encounter (INDEPENDENT_AMBULATORY_CARE_PROVIDER_SITE_OTHER): Payer: Self-pay | Admitting: Physical Medicine and Rehabilitation

## 2017-01-30 VITALS — BP 158/76 | HR 67

## 2017-01-30 DIAGNOSIS — S32050S Wedge compression fracture of fifth lumbar vertebra, sequela: Secondary | ICD-10-CM

## 2017-01-30 DIAGNOSIS — M5416 Radiculopathy, lumbar region: Secondary | ICD-10-CM

## 2017-01-30 DIAGNOSIS — M48062 Spinal stenosis, lumbar region with neurogenic claudication: Secondary | ICD-10-CM

## 2017-01-30 NOTE — Progress Notes (Signed)
Vanessa Knox - 83 y.o. female MRN [id number]  Date of birth: 1916/12/02  Office Visit Note: Visit Date: 01/30/2017 PCP: Pcp Not In System Referred by: No ref. provider found  Subjective: Chief Complaint  Patient presents with  . Lower Back - Pain   HPI: Vanessa Knox is a very pleasant and alert 83 year old female who is present with her daughter today. We completed an epidural injection at L5-S1 for what is severe multifactorial stenosis at L4-5 with radicular leg pain. She reports at least 65% relief of her symptoms initially with some return of her symptoms as of late but still better than she was. She was not taking any of her hydrocodone up until just yesterday and the pain started to return. The pain does go into the calf area in L5 distribution. By way review she's had prior kyphoplasty at L5 as well for compression fracture. Also by way of review we completed greater trochanteric injection the first time we saw her without much relief at all so this is clearly radicular pain from the stenosis. She may having some back pain from the facet joints. Overall she says she's been able to move some better. She is again in a wheelchair today. Overall she feels like she has done much better and is happy not to have the very severe shooting pain into the hip area that she had before. She had no problems with the injection or any side effects. She's had no new focal weakness or night pain or fever chills or night sweats. She has had some therapy in the past and has been trying to eat more active.    Pain has been better since injection. Still has achy pain sometimes, but no stabbing pain. Has not had to take medicine for pain for the last 4 days and has been able to walk a few steps. She says there was some pain this morning and she had to take half a pill.  Review of Systems  Constitutional: Negative for chills, fever, malaise/fatigue and weight loss.  HENT: Negative for hearing loss and sinus pain.     Eyes: Negative for blurred vision, double vision and photophobia.  Respiratory: Negative for cough and shortness of breath.   Cardiovascular: Negative for chest pain, palpitations and leg swelling.  Gastrointestinal: Negative for abdominal pain, nausea and vomiting.  Genitourinary: Negative for flank pain.  Musculoskeletal: Positive for back pain. Negative for myalgias.  Skin: Negative for itching and rash.  Neurological: Positive for tingling. Negative for tremors, focal weakness and weakness.  Endo/Heme/Allergies: Negative.   Psychiatric/Behavioral: Negative for depression.  All other systems reviewed and are negative.  Otherwise per HPI.  Assessment & Plan: Visit Diagnoses:  1. Spinal stenosis of lumbar region with neurogenic claudication   2. Lumbar radiculopathy   3. Compression fracture of L5 lumbar vertebra, sequela     Plan: Findings:  Chronic severe history of low back pain most recently with vertebral column fracture at L4-5 which was treated with kyphoplasty. Her stenosis still however remains she has not had surgery at that level which is L4-5. She is having. Classic radicular symptoms prior to epidural injection in a few weeks ago. This is seemingly relief 65% of the symptoms but the symptoms are returning and worsening. Overall she is doing somewhat better. A repeat the injection one time and see how she does. Depending on the length of time she gets relief we could look at transforaminal approach. Hopefully is a situation where it's a repeatable  injection very infrequently and we discussed this with her and her daughter. She should continue to stay active when she can. She should continue taking the medication when she really needs it. She should minimize that as much as she can she does do this. We will schedule her for repeat injection. I spent more than 25 minutes speaking face-to-face with the patient with 50% of the time in counseling.    Meds & Orders: No orders of the  defined types were placed in this encounter.  No orders of the defined types were placed in this encounter.   Follow-up: Return for Repeat L5-S1 intralaminar epidural steroid injection..   Procedures: No procedures performed  No notes on file   Clinical History: Lumbar spine MRI dated March 2017 shows multilevel degenerative disc height loss with endplate changes but otherwise fairly normal anatomic alignment. There is the wedge compression fracture at L5-S1. There is also multifactorial severe stenosis at L4-5. There is facet arthropathy throughout.  She reports that she has never smoked. She has never used smokeless tobacco. No results for input(s): HGBA1C, LABURIC in the last 8760 hours.  Objective:  VS:  HT:    WT:   BMI:     BP:(!) 158/76  HR:67bpm  TEMP: ( )  RESP:  Physical Exam  Constitutional: She is oriented to person, place, and time. She appears well-developed and well-nourished.  Eyes: Conjunctivae and EOM are normal. Pupils are equal, round, and reactive to light.  Neck: Normal range of motion. Neck supple.  Cardiovascular: Normal rate and intact distal pulses.   Pulmonary/Chest: Effort normal.  Musculoskeletal:  Patient comes in today in a wheelchair. She can stand from a seated position but is somewhat unsteady. She has good distal strength without any deficits. No clonus. She has some decreased sensation still in the distal lower L5 dermatome. She has no pain with hip rotation.  Neurological: She is alert and oriented to person, place, and time.  Skin: Skin is warm and dry. No rash noted. No erythema.  Psychiatric: She has a normal mood and affect. Her behavior is normal.  Nursing note and vitals reviewed.   Ortho Exam Imaging: No results found.  Past Medical/Family/Surgical/Social History: Medications & Allergies reviewed per EMR Patient Active Problem List   Diagnosis Date Noted  . Compression fracture of L5 lumbar vertebra, sequela 01/02/2017  . Spinal  stenosis of lumbar region with neurogenic claudication 01/02/2017  . Chronic low back pain with left-sided sciatica 01/02/2017   History reviewed. No pertinent past medical history. History reviewed. No pertinent family history. History reviewed. No pertinent surgical history. Social History   Occupational History  . Not on file.   Social History Main Topics  . Smoking status: Never Smoker  . Smokeless tobacco: Never Used  . Alcohol use No  . Drug use: Unknown  . Sexual activity: Not on file

## 2017-01-31 ENCOUNTER — Encounter (INDEPENDENT_AMBULATORY_CARE_PROVIDER_SITE_OTHER): Payer: Self-pay | Admitting: Physical Medicine and Rehabilitation

## 2017-02-05 ENCOUNTER — Ambulatory Visit (INDEPENDENT_AMBULATORY_CARE_PROVIDER_SITE_OTHER): Payer: Medicare Other | Admitting: Physical Medicine and Rehabilitation

## 2017-02-05 ENCOUNTER — Encounter (INDEPENDENT_AMBULATORY_CARE_PROVIDER_SITE_OTHER): Payer: Medicare Other | Admitting: Physical Medicine and Rehabilitation

## 2017-03-03 ENCOUNTER — Ambulatory Visit (INDEPENDENT_AMBULATORY_CARE_PROVIDER_SITE_OTHER): Payer: Medicare Other

## 2017-03-03 ENCOUNTER — Encounter (INDEPENDENT_AMBULATORY_CARE_PROVIDER_SITE_OTHER): Payer: Self-pay | Admitting: Physical Medicine and Rehabilitation

## 2017-03-03 ENCOUNTER — Ambulatory Visit (INDEPENDENT_AMBULATORY_CARE_PROVIDER_SITE_OTHER): Payer: Medicare Other | Admitting: Physical Medicine and Rehabilitation

## 2017-03-03 VITALS — BP 159/75 | HR 70

## 2017-03-03 DIAGNOSIS — M48062 Spinal stenosis, lumbar region with neurogenic claudication: Secondary | ICD-10-CM

## 2017-03-03 DIAGNOSIS — M5416 Radiculopathy, lumbar region: Secondary | ICD-10-CM | POA: Diagnosis not present

## 2017-03-03 MED ORDER — METHYLPREDNISOLONE ACETATE 80 MG/ML IJ SUSP
80.0000 mg | Freq: Once | INTRAMUSCULAR | Status: AC
  Administered 2017-03-03: 80 mg

## 2017-03-03 MED ORDER — LIDOCAINE HCL (PF) 1 % IJ SOLN
0.3300 mL | Freq: Once | INTRAMUSCULAR | Status: AC
  Administered 2017-03-03: 0.3 mL

## 2017-03-03 NOTE — Progress Notes (Signed)
Vanessa Knox - 83 y.o. female MRN [id number]  Date of birth: 10-Mar-1916  Office Visit Note: Visit Date: 03/03/2017 PCP: Pcp Not In System Referred by: No ref. provider found  Subjective: Chief Complaint  Patient presents with  . Lower Back - Pain   HPI: Vanessa Knox is a 83 year old patient that in January we completed a left L5-S1 intralaminar epidural steroid injection. She reported 65% relief of the symptoms we wanted to repeat that but unfortunately the fluoroscopic unit went down for repair. She comes back in today with a almost worsening pain that she had before. She does have multifactorial severe stenosis at L4-5 with prior kyphoplasty at L5-S1. The kyphoplasty does make a little bit hard to see on the fluoroscopic imaging. We do want to repeat the interlaminar injection today. I discussed this with her and she is with her family. She today seems a little bit down trodden because of the pain symptoms and the fact that the injection just didn't seem to cause the pain to go away permanently. We discussed this for a brief moment and I did want to repeat it and just see how well it does. I discussed with her that we would not just keep doing injections if they didn't help but I did try to tell her that if possible she give [place] one more chance after this one if it didn't work to look at a transforaminal approach at the level of stenosis. She does want to do that. Patient is here today for planned left L5-S1 interlaminar injection. No change in symptoms.    ROS Otherwise per HPI.  Assessment & Plan: Visit Diagnoses:  1. Lumbar radiculopathy   2. Spinal stenosis of lumbar region with neurogenic claudication     Plan: Findings:  Left L5-S1 interlaminar epidural steroid injection.    Meds & Orders:  Meds ordered this encounter  Medications  . lidocaine (PF) (XYLOCAINE) 1 % injection 0.3 mL  . methylPREDNISolone acetate (DEPO-MEDROL) injection 80 mg    Orders Placed This Encounter    Procedures  . XR C-ARM NO REPORT  . Epidural Steroid injection    Follow-up: Return if symptoms worsen or fail to improve, 2 weeks.   Procedures: No procedures performed  Lumbar Epidural Steroid Injection - Interlaminar Approach with Fluoroscopic Guidance  Patient: Vanessa Knox      Date of Birth: Jan 14, 1916 MRN: [id number] PCP: Pcp Not In System      Visit Date: 03/03/2017   Universal Protocol:    Date/Time: 03/21/185:26 AM  Consent Given By: the patient  Position: PRONE  Additional Comments: Vital signs were monitored before and after the procedure. Patient was prepped and draped in the usual sterile fashion. The correct patient, procedure, and site was verified.   Injection Procedure Details:  Procedure Site One Meds Administered:  Meds ordered this encounter  Medications  . lidocaine (PF) (XYLOCAINE) 1 % injection 0.3 mL  . methylPREDNISolone acetate (DEPO-MEDROL) injection 80 mg     Laterality: Left  Location/Site:  L5-S1  Needle size: 20 G  Needle type: Tuohy  Needle Placement: Paramedian epidural  Findings:  -Contrast Used: 1 mL iohexol 180 mg iodine/mL   -Comments: Excellent flow of contrast into the epidural space. and We initially had difficulty with placement due to image quality and prior kyphoplasty. With repositioning however we did get good excellent flow into the epidural space.  Procedure Details: Using a paramedian approach from the side mentioned above, the region overlying the inferior lamina was  localized under fluoroscopic visualization and the soft tissues overlying this structure were infiltrated with 4 ml. of 1% Lidocaine without Epinephrine. The Tuohy needle was inserted into the epidural space using a paramedian approach.   The epidural space was localized using loss of resistance along with lateral and bi-planar fluoroscopic views.  After negative aspirate for air, blood, and CSF, a 2 ml. volume of Isovue-250 was injected into the  epidural space and the flow of contrast was observed. Radiographs were obtained for documentation purposes.    The injectate was administered into the level noted above.   Additional Comments:  The patient tolerated the procedure well Dressing: Band-Aid    Post-procedure details: Patient was observed during the procedure. Post-procedure instructions were reviewed.  Patient left the clinic in stable condition.    Clinical History: Lumbar spine MRI dated March 2017 shows multilevel degenerative disc height loss with endplate changes but otherwise fairly normal anatomic alignment. There is the wedge compression fracture at L5-S1. There is also multifactorial severe stenosis at L4-5. There is facet arthropathy throughout.  She reports that she has never smoked. She has never used smokeless tobacco. No results for input(s): HGBA1C, LABURIC in the last 8760 hours.  Objective:  VS:  HT:    WT:   BMI:     BP:(!) 159/75  HR:70bpm  TEMP: ( )  RESP:98 % Physical Exam  Musculoskeletal:  Patient is seated in a wheelchair she can stand and arise but with some difficulty. She has good distal strength.    Ortho Exam Imaging: No results found.  Past Medical/Family/Surgical/Social History: Medications & Allergies reviewed per EMR Patient Active Problem List   Diagnosis Date Noted  . Compression fracture of L5 lumbar vertebra, sequela 01/02/2017  . Spinal stenosis of lumbar region with neurogenic claudication 01/02/2017  . Chronic low back pain with left-sided sciatica 01/02/2017   No past medical history on file. No family history on file. No past surgical history on file. Social History   Occupational History  . Not on file.   Social History Main Topics  . Smoking status: Never Smoker  . Smokeless tobacco: Never Used  . Alcohol use No  . Drug use: Unknown  . Sexual activity: Not on file

## 2017-03-03 NOTE — Patient Instructions (Signed)

## 2017-03-05 NOTE — Procedures (Signed)
Lumbar Epidural Steroid Injection - Interlaminar Approach with Fluoroscopic Guidance  Patient: Vanessa Knox      Date of Birth: [birth year] MRN: [id number] PCP: Pcp Not In System      Visit Date: 03/03/2017   Universal Protocol:    Date/Time: 03/21/185:26 AM  Consent Given By: the patient  Position: PRONE  Additional Comments: Vital signs were monitored before and after the procedure. Patient was prepped and draped in the usual sterile fashion. The correct patient, procedure, and site was verified.   Injection Procedure Details:  Procedure Site One Meds Administered:  Meds ordered this encounter  Medications  . lidocaine (PF) (XYLOCAINE) 1 % injection 0.3 mL  . methylPREDNISolone acetate (DEPO-MEDROL) injection 80 mg     Laterality: Left  Location/Site:  L5-S1  Needle size: 20 G  Needle type: Tuohy  Needle Placement: Paramedian epidural  Findings:  -Contrast Used: 1 mL iohexol 180 mg iodine/mL   -Comments: Excellent flow of contrast into the epidural space. and We initially had difficulty with placement due to image quality and prior kyphoplasty. With repositioning however we did get good excellent flow into the epidural space.  Procedure Details: Using a paramedian approach from the side mentioned above, the region overlying the inferior lamina was localized under fluoroscopic visualization and the soft tissues overlying this structure were infiltrated with 4 ml. of 1% Lidocaine without Epinephrine. The Tuohy needle was inserted into the epidural space using a paramedian approach.   The epidural space was localized using loss of resistance along with lateral and bi-planar fluoroscopic views.  After negative aspirate for air, blood, and CSF, a 2 ml. volume of Isovue-250 was injected into the epidural space and the flow of contrast was observed. Radiographs were obtained for documentation purposes.    The injectate was administered into the level noted  above.   Additional Comments:  The patient tolerated the procedure well Dressing: Band-Aid    Post-procedure details: Patient was observed during the procedure. Post-procedure instructions were reviewed.  Patient left the clinic in stable condition.

## 2017-03-20 ENCOUNTER — Ambulatory Visit (INDEPENDENT_AMBULATORY_CARE_PROVIDER_SITE_OTHER): Payer: Medicare Other | Admitting: Physical Medicine and Rehabilitation

## 2017-03-20 ENCOUNTER — Ambulatory Visit (INDEPENDENT_AMBULATORY_CARE_PROVIDER_SITE_OTHER): Payer: Medicare Other

## 2017-03-20 ENCOUNTER — Encounter (INDEPENDENT_AMBULATORY_CARE_PROVIDER_SITE_OTHER): Payer: Self-pay | Admitting: Physical Medicine and Rehabilitation

## 2017-03-20 VITALS — BP 141/75 | HR 72

## 2017-03-20 DIAGNOSIS — M5416 Radiculopathy, lumbar region: Secondary | ICD-10-CM | POA: Diagnosis not present

## 2017-03-20 MED ORDER — LIDOCAINE HCL (PF) 1 % IJ SOLN
0.3300 mL | Freq: Once | INTRAMUSCULAR | Status: AC
  Administered 2017-03-20: 0.3 mL

## 2017-03-20 MED ORDER — METHYLPREDNISOLONE ACETATE 80 MG/ML IJ SUSP
80.0000 mg | Freq: Once | INTRAMUSCULAR | Status: AC
  Administered 2017-03-20: 80 mg

## 2017-03-20 NOTE — Patient Instructions (Signed)

## 2017-03-20 NOTE — Progress Notes (Signed)
Vanessa Knox - 83 y.o. female MRN [id number]  Date of birth: May 03, 1916  Office Visit Note: Visit Date: 03/20/2017 PCP: Vanessa Lopes, MD Referred by: No ref. provider found  Subjective: Chief Complaint  Patient presents with  . Lower Back - Pain   HPI: Mrs.  Laduke is a pleasant 83 year old female with severe stenosis at L4-5 with prior compression fracture at L5 status post kyphoplasty. Patient states she had some relief for a few days after last injection. Was unable to give a % of relief just knows that it helped some and per daughter in law she was able to get around better. She states after a few days pain was back. Pain level varies from day to day and is left sided. Just as we spoke last time I think we should try transforaminal approach on the left at L4. Depending on the relief she gets with this we would look at either medication management or some other avenue. She is probably not a surgical candidate for decompression.         ROS Otherwise per HPI.  Assessment & Plan: Visit Diagnoses:  1. Lumbar radiculopathy     Plan: Findings:  Left L4 transforaminal epidural steroid injection.    Meds & Orders:  Meds ordered this encounter  Medications  . lidocaine (PF) (XYLOCAINE) 1 % injection 0.3 mL  . methylPREDNISolone acetate (DEPO-MEDROL) injection 80 mg    Orders Placed This Encounter  Procedures  . XR C-ARM NO REPORT  . Epidural Steroid injection    Follow-up: Return if symptoms worsen or fail to improve.   Procedures: No procedures performed  Lumbosacral Transforaminal Epidural Steroid Injection - Infraneural Approach with Fluoroscopic Guidance  Patient: Vanessa Knox      Date of Birth: 12/09/1916 MRN: [id number] PCP: Vanessa Lopes, MD      Visit Date: 03/20/2017   Universal Protocol:    Date/Time: 04/09/181:16 PM  Consent Given By: the patient  Position: PRONE   Additional Comments: Vital signs were monitored before and after the  procedure. Patient was prepped and draped in the usual sterile fashion. The correct patient, procedure, and site was verified.   Injection Procedure Details:  Procedure Site One Meds Administered:  Meds ordered this encounter  Medications  . lidocaine (PF) (XYLOCAINE) 1 % injection 0.3 mL  . methylPREDNISolone acetate (DEPO-MEDROL) injection 80 mg      Laterality: Left  Location/Site:  L4-L5  Needle size: 22 G  Needle type: Spinal  Needle Placement: Transforaminal  Findings:  -Contrast Used: 1 mL iohexol 180 mg iodine/mL   -Comments: Excellent flow of contrast along the nerve and into the epidural space.  Procedure Details: After squaring off the end-plates of the desired vertebral level to get a true AP view, the C-arm was obliqued to the painful side so that the superior articulating process is positioned about 1/3 the length of the inferior endplate.  The needle was aimed toward the junction of the superior articular process and the transverse process of the inferior vertebrae. The needle's initial entry is in the lower third of the foramen through Kambin's triangle. The soft tissues overlying this target were infiltrated with 2-3 ml. of 1% Lidocaine without Epinephrine.  The spinal needle was then inserted and advanced toward the target using a "trajectory" view along the fluoroscope beam.  Under AP and lateral visualization, the needle was advanced so it did not puncture dura and did not traverse medially beyond the 6 o'clock position of the pedicle. Bi-planar  projections were used to confirm position. Aspiration was confirmed to be negative for CSF and/or blood. A 1-2 ml. volume of Isovue-250 was injected and flow of contrast was noted at each level. Radiographs were obtained for documentation purposes.   After attaining the desired flow of contrast documented above, a 0.5 to 1.0 ml test dose of 0.25% Marcaine was injected into each respective transforaminal space.  The  patient was observed for 90 seconds post injection.  After no sensory deficits were reported, and normal lower extremity motor function was noted,   the above injectate was administered so that equal amounts of the injectate were placed at each foramen (level) into the transforaminal epidural space.   Additional Comments:  The patient tolerated the procedure well Dressing: Band-Aid    Post-procedure details: Patient was observed during the procedure. Post-procedure instructions were reviewed.  Patient left the clinic in stable condition.   Clinical History: Lumbar spine MRI dated March 2017 shows multilevel degenerative disc height loss with endplate changes but otherwise fairly normal anatomic alignment. There is the wedge compression fracture at L5-S1. There is also multifactorial severe stenosis at L4-5. There is facet arthropathy throughout.  She reports that she has never smoked. She has never used smokeless tobacco. No results for input(s): HGBA1C, LABURIC in the last 8760 hours.  Objective:  VS:  HT:    WT:   BMI:     BP:(!) 141/75  HR:72bpm  TEMP: ( )  RESP:98 % Physical Exam  Musculoskeletal:  Patient ambulates in the office in a wheelchair. She can stand but is very difficult to stand. She has good distal strength without clonus.    Ortho Exam Imaging: No results found.  Past Medical/Family/Surgical/Social History: Medications & Allergies reviewed per EMR Patient Active Problem List   Diagnosis Date Noted  . Compression fracture of L5 lumbar vertebra, sequela 01/02/2017  . Spinal stenosis of lumbar region with neurogenic claudication 01/02/2017  . Chronic low back pain with left-sided sciatica 01/02/2017   No past medical history on file. No family history on file. No past surgical history on file. Social History   Occupational History  . Not on file.   Social History Main Topics  . Smoking status: Never Smoker  . Smokeless tobacco: Never Used  . Alcohol  use No  . Drug use: Unknown  . Sexual activity: Not on file

## 2017-03-24 ENCOUNTER — Encounter (INDEPENDENT_AMBULATORY_CARE_PROVIDER_SITE_OTHER): Payer: Medicare Other | Admitting: Physical Medicine and Rehabilitation

## 2017-03-24 NOTE — Procedures (Signed)
Lumbosacral Transforaminal Epidural Steroid Injection - Infraneural Approach with Fluoroscopic Guidance  Patient: Vanessa Knox      Date of Birth: Surie 05, 1917 MRN: [id number] PCP: Donnajean Lopes, MD      Visit Date: 03/20/2017   Universal Protocol:    Date/Time: 04/09/181:16 PM  Consent Given By: the patient  Position: PRONE   Additional Comments: Vital signs were monitored before and after the procedure. Patient was prepped and draped in the usual sterile fashion. The correct patient, procedure, and site was verified.   Injection Procedure Details:  Procedure Site One Meds Administered:  Meds ordered this encounter  Medications  . lidocaine (PF) (XYLOCAINE) 1 % injection 0.3 mL  . methylPREDNISolone acetate (DEPO-MEDROL) injection 80 mg      Laterality: Left  Location/Site:  L4-L5  Needle size: 22 G  Needle type: Spinal  Needle Placement: Transforaminal  Findings:  -Contrast Used: 1 mL iohexol 180 mg iodine/mL   -Comments: Excellent flow of contrast along the nerve and into the epidural space.  Procedure Details: After squaring off the end-plates of the desired vertebral level to get a true AP view, the C-arm was obliqued to the painful side so that the superior articulating process is positioned about 1/3 the length of the inferior endplate.  The needle was aimed toward the junction of the superior articular process and the transverse process of the inferior vertebrae. The needle's initial entry is in the lower third of the foramen through Kambin's triangle. The soft tissues overlying this target were infiltrated with 2-3 ml. of 1% Lidocaine without Epinephrine.  The spinal needle was then inserted and advanced toward the target using a "trajectory" view along the fluoroscope beam.  Under AP and lateral visualization, the needle was advanced so it did not puncture dura and did not traverse medially beyond the 6 o'clock position of the pedicle. Bi-planar projections  were used to confirm position. Aspiration was confirmed to be negative for CSF and/or blood. A 1-2 ml. volume of Isovue-250 was injected and flow of contrast was noted at each level. Radiographs were obtained for documentation purposes.   After attaining the desired flow of contrast documented above, a 0.5 to 1.0 ml test dose of 0.25% Marcaine was injected into each respective transforaminal space.  The patient was observed for 90 seconds post injection.  After no sensory deficits were reported, and normal lower extremity motor function was noted,   the above injectate was administered so that equal amounts of the injectate were placed at each foramen (level) into the transforaminal epidural space.   Additional Comments:  The patient tolerated the procedure well Dressing: Band-Aid    Post-procedure details: Patient was observed during the procedure. Post-procedure instructions were reviewed.  Patient left the clinic in stable condition.

## 2017-05-20 ENCOUNTER — Telehealth (INDEPENDENT_AMBULATORY_CARE_PROVIDER_SITE_OTHER): Payer: Self-pay | Admitting: Physical Medicine and Rehabilitation

## 2017-05-20 NOTE — Telephone Encounter (Signed)
If the last injection helped significantly then I would repeat x1 - the patient had to be convinced to last injeciton. Alternative is to look at [facility] pain [facility].

## 2017-05-21 NOTE — Telephone Encounter (Signed)
Patient's daughter in law will discuss the option with her husband and mother in law and call us back when they decide which step to take.

## 2017-07-14 ENCOUNTER — Emergency Department (HOSPITAL_COMMUNITY): Payer: Medicare Other

## 2017-07-14 ENCOUNTER — Encounter (HOSPITAL_COMMUNITY): Payer: Self-pay | Admitting: Emergency Medicine

## 2017-07-14 ENCOUNTER — Observation Stay (HOSPITAL_COMMUNITY)
Admission: EM | Admit: 2017-07-14 | Discharge: 2017-07-15 | Disposition: A | Discharge status: Home / Self Care | Payer: Medicare Other | Attending: Internal Medicine | Admitting: Internal Medicine

## 2017-07-14 ENCOUNTER — Observation Stay (HOSPITAL_COMMUNITY): Payer: Medicare Other

## 2017-07-14 DIAGNOSIS — R471 Dysarthria and anarthria: Secondary | ICD-10-CM | POA: Diagnosis not present

## 2017-07-14 DIAGNOSIS — G459 Transient cerebral ischemic attack, unspecified: Principal | ICD-10-CM | POA: Diagnosis present

## 2017-07-14 DIAGNOSIS — R2681 Unsteadiness on feet: Secondary | ICD-10-CM | POA: Insufficient documentation

## 2017-07-14 DIAGNOSIS — M6281 Muscle weakness (generalized): Secondary | ICD-10-CM | POA: Diagnosis not present

## 2017-07-14 DIAGNOSIS — M5442 Lumbago with sciatica, left side: Secondary | ICD-10-CM | POA: Diagnosis not present

## 2017-07-14 DIAGNOSIS — R479 Unspecified speech disturbances: Secondary | ICD-10-CM

## 2017-07-14 DIAGNOSIS — I10 Essential (primary) hypertension: Secondary | ICD-10-CM | POA: Diagnosis not present

## 2017-07-14 DIAGNOSIS — Z7982 Long term (current) use of aspirin: Secondary | ICD-10-CM | POA: Insufficient documentation

## 2017-07-14 DIAGNOSIS — G8929 Other chronic pain: Secondary | ICD-10-CM | POA: Diagnosis not present

## 2017-07-14 HISTORY — DX: Essential (primary) hypertension: I10

## 2017-07-14 HISTORY — DX: Sciatica, unspecified side: M54.30

## 2017-07-14 LAB — I-STAT CHEM 8, ED
BUN: 22 mg/dL — ABNORMAL HIGH (ref 6–20)
CALCIUM ION: 1.15 mmol/L (ref 1.15–1.40)
CHLORIDE: 102 mmol/L (ref 101–111)
Creatinine, Ser: 1 mg/dL (ref 0.44–1.00)
Glucose, Bld: 137 mg/dL — ABNORMAL HIGH (ref 65–99)
HCT: 40 % (ref 36.0–46.0)
Hemoglobin: 13.6 g/dL (ref 12.0–15.0)
POTASSIUM: 3.9 mmol/L (ref 3.5–5.1)
SODIUM: 142 mmol/L (ref 135–145)
TCO2: 26 mmol/L (ref 0–100)

## 2017-07-14 LAB — CBC
HCT: 41 % (ref 36.0–46.0)
Hemoglobin: 12.7 g/dL (ref 12.0–15.0)
MCH: 28.2 pg (ref 26.0–34.0)
MCHC: 31 g/dL (ref 30.0–36.0)
MCV: 90.9 fL (ref 78.0–100.0)
PLATELETS: 258 10*3/uL (ref 150–400)
RBC: 4.51 MIL/uL (ref 3.87–5.11)
RDW: 14.4 % (ref 11.5–15.5)
WBC: 5.9 10*3/uL (ref 4.0–10.5)

## 2017-07-14 LAB — COMPREHENSIVE METABOLIC PANEL
ALT: 12 U/L — ABNORMAL LOW (ref 14–54)
ANION GAP: 7 (ref 5–15)
AST: 20 U/L (ref 15–41)
Albumin: 3.8 g/dL (ref 3.5–5.0)
Alkaline Phosphatase: 79 U/L (ref 38–126)
BUN: 19 mg/dL (ref 6–20)
CHLORIDE: 104 mmol/L (ref 101–111)
CO2: 28 mmol/L (ref 22–32)
Calcium: 8.8 mg/dL — ABNORMAL LOW (ref 8.9–10.3)
Creatinine, Ser: 0.95 mg/dL (ref 0.44–1.00)
GFR, EST AFRICAN AMERICAN: 56 mL/min — AB (ref >60)
GFR, EST NON AFRICAN AMERICAN: 48 mL/min — AB (ref >60)
Glucose, Bld: 144 mg/dL — ABNORMAL HIGH (ref 65–99)
Potassium: 3.9 mmol/L (ref 3.5–5.1)
SODIUM: 139 mmol/L (ref 135–145)
Total Bilirubin: 0.6 mg/dL (ref 0.3–1.2)
Total Protein: 6.4 g/dL — ABNORMAL LOW (ref 6.5–8.1)

## 2017-07-14 LAB — DIFFERENTIAL
BASOS PCT: 1 %
Basophils Absolute: 0 10*3/uL (ref 0.0–0.1)
EOS ABS: 0.1 10*3/uL (ref 0.0–0.7)
EOS PCT: 2 %
Lymphocytes Relative: 30 %
Lymphs Abs: 1.8 10*3/uL (ref 0.7–4.0)
MONO ABS: 0.5 10*3/uL (ref 0.1–1.0)
Monocytes Relative: 8 %
Neutro Abs: 3.5 10*3/uL (ref 1.7–7.7)
Neutrophils Relative %: 59 %

## 2017-07-14 LAB — APTT: aPTT: 30 seconds (ref 24–36)

## 2017-07-14 LAB — PROTIME-INR
INR: 1
PROTHROMBIN TIME: 13.2 s (ref 11.4–15.2)

## 2017-07-14 LAB — I-STAT TROPONIN, ED: TROPONIN I, POC: 0 ng/mL (ref 0.00–0.08)

## 2017-07-14 MED ORDER — ACETAMINOPHEN 650 MG RE SUPP: 650.0000 mg | RECTAL | Status: DC | PRN

## 2017-07-14 MED ORDER — ENOXAPARIN SODIUM 30 MG/0.3ML ~~LOC~~ SOLN
30.0000 mg | SUBCUTANEOUS | Status: DC
  Administered 2017-07-15: 30 mg via SUBCUTANEOUS
  Filled 2017-07-14: qty 0.3

## 2017-07-14 MED ORDER — STROKE: EARLY STAGES OF RECOVERY BOOK
Freq: Once | Status: AC
  Administered 2017-07-15: 07:00:00
  Filled 2017-07-14: qty 1

## 2017-07-14 MED ORDER — METOPROLOL SUCCINATE ER 25 MG PO TB24
25.0000 mg | ORAL_TABLET | Freq: Every day | ORAL | Status: DC
  Administered 2017-07-15: 25 mg via ORAL
  Filled 2017-07-14: qty 1

## 2017-07-14 MED ORDER — SENNOSIDES-DOCUSATE SODIUM 8.6-50 MG PO TABS: 1.0000 | ORAL_TABLET | Freq: Every evening | ORAL | Status: DC | PRN

## 2017-07-14 MED ORDER — ACETAMINOPHEN 325 MG PO TABS
650.0000 mg | ORAL_TABLET | ORAL | Status: DC | PRN
  Administered 2017-07-15: 650 mg via ORAL
  Filled 2017-07-14: qty 2

## 2017-07-14 MED ORDER — DIAZEPAM 2 MG PO TABS
2.0000 mg | ORAL_TABLET | Freq: Once | ORAL | Status: AC
  Administered 2017-07-14: 2 mg via ORAL
  Filled 2017-07-14: qty 1

## 2017-07-14 MED ORDER — ACETAMINOPHEN 160 MG/5ML PO SOLN: 650.0000 mg | ORAL | Status: DC | PRN

## 2017-07-14 NOTE — ED Triage Notes (Signed)
Pt to ER coming from assisted living facility where staff reported patient had 15 minutes of noted expressive aphasia during lunch that has completely resolved, per family. No neuro deficits noted on arrival. Pt speaking in clear sentences. Sent here for rule out stroke.

## 2017-07-14 NOTE — Discharge Summary (Signed)
History and Physical    Vanessa Knox QQP:[id number] DOB: 03/24/1916 DOA: 07/14/2017  Referring MD/NP/PA: er PCP: Leanna Battles, MD Outpatient Specialists: Ernestina Patches (pain) Patient coming from:   Chief Complaint: speech difficulty   HPI: Vanessa Knox is a 83 y.o. female with medical history significant of HTN and sciatica.  She lives in an ALF and was at lunch today when she has speaking difficulty.  Episode lasted 30-45 minutes.  During the time, she knew what she wanted to say but was unable to what she was thinking.  It resolved by the time patient reached the ER.    In the ER, CT scan was normal.   Labs unremarkable and hospitalist were asked to place in observation for TIA/CVA. No fever, no headache.  Review of Systems: all systems reviewed, negative unless stated above in HPI   Past Medical History:  Diagnosis Date  . HTN (hypertension)   . Sciatica     History reviewed. No pertinent surgical history.   reports that she has never smoked. She has never used smokeless tobacco. She reports that she does not drink alcohol or use drugs.  Allergies  Allergen Reactions  . Keflex [Cephalexin] Diarrhea and Other (See Comments)    Headaches and dizziness also     Family hx: no contributory   Prior to Admission medications   Medication Sig Start Date End Date Taking? Authorizing Provider  HYDROcodone-acetaminophen (NORCO/VICODIN) 5-325 MG tablet Take 1 tablet by mouth every 6 (six) hours as needed for moderate pain.    [provider]  metoprolol succinate (TOPROL-XL) 25 MG 24 hr tablet Take 25 mg by mouth daily.    [provider]    Physical Exam: Vitals:   07/14/17 1545 07/14/17 1600 07/14/17 1615 07/14/17 1630  BP: (!) 215/84 (!) 201/74 (!) 188/78 (!) 186/95  Pulse: (!) 50 65 60 61  Resp: 16 19 19 16   Temp:      TempSrc:      SpO2: (!) 86% 98% 98% 100%      Constitutional: NAD, calm, comfortable- younger than stated age Vitals:   07/14/17 1545  07/14/17 1600 07/14/17 1615 07/14/17 1630  BP: (!) 215/84 (!) 201/74 (!) 188/78 (!) 186/95  Pulse: (!) 50 65 60 61  Resp: 16 19 19 16   Temp:      TempSrc:      SpO2: (!) 86% 98% 98% 100%   Eyes: PERRL, lids and conjunctivae normal ENMT: Mucous membranes are moist. Posterior pharynx clear of any exudate or lesions.Normal dentition.  Neck: normal, supple, no masses, no thyromegaly Respiratory: clear to auscultation bilaterally, no wheezing, no crackles. Normal respiratory effort. No accessory muscle use.  Cardiovascular: Regular rate and rhythm, no murmurs / rubs / gallops. No extremity edema. 2+ pedal pulses. No carotid bruits.  Abdomen: no tenderness, no masses palpated. No hepatosplenomegaly. Bowel sounds positive.  Musculoskeletal: no clubbing / cyanosis. No joint deformity upper and lower extremities. Good ROM, no contractures. Normal muscle tone.  Skin: no rashes, lesions, ulcers. No induration Neurologic: CN 2-12 grossly intact. Sensation intact, DTR normal. Strength 5/5 in all 4.  Psychiatric: Normal judgment and insight. Alert and oriented x 3. Normal mood.    Labs on Admission: I have personally reviewed following labs and imaging studies  CBC:  Recent Labs Lab 07/14/17 1403 07/14/17 1457  WBC 5.9  --   NEUTROABS 3.5  --   HGB 12.7 13.6  HCT 41.0 40.0  MCV 90.9  --   PLT 258  --  Basic Metabolic Panel:  Recent Labs Lab 07/14/17 1403 07/14/17 1457  NA 139 142  K 3.9 3.9  CL 104 102  CO2 28  --   GLUCOSE 144* 137*  BUN 19 22*  CREATININE 0.95 1.00  CALCIUM 8.8*  --    GFR: CrCl cannot be calculated (Unknown ideal weight.). Liver Function Tests:  Recent Labs Lab 07/14/17 1403  AST 20  ALT 12*  ALKPHOS 79  BILITOT 0.6  PROT 6.4*  ALBUMIN 3.8   No results for input(s): LIPASE, AMYLASE in the last 168 hours. No results for input(s): AMMONIA in the last 168 hours. Coagulation Profile:  Recent Labs Lab 07/14/17 1403  INR 1.00   Cardiac  Enzymes: No results for input(s): CKTOTAL, CKMB, CKMBINDEX, TROPONINI in the last 168 hours. BNP (last 3 results) No results for input(s): PROBNP in the last 8760 hours. HbA1C: No results for input(s): HGBA1C in the last 72 hours. CBG: No results for input(s): GLUCAP in the last 168 hours. Lipid Profile: No results for input(s): CHOL, HDL, LDLCALC, TRIG, CHOLHDL, LDLDIRECT in the last 72 hours. Thyroid Function Tests: No results for input(s): TSH, T4TOTAL, FREET4, T3FREE, THYROIDAB in the last 72 hours. Anemia Panel: No results for input(s): VITAMINB12, FOLATE, FERRITIN, TIBC, IRON, RETICCTPCT in the last 72 hours. Urine analysis: No results found for: COLORURINE, APPEARANCEUR, LABSPEC, PHURINE, GLUCOSEU, HGBUR, BILIRUBINUR, KETONESUR, PROTEINUR, UROBILINOGEN, NITRITE, LEUKOCYTESUR Sepsis Labs: Invalid input(s): PROCALCITONIN, LACTICIDVEN No results found for this or any previous visit (from the past 240 hour(s)).   Radiological Exams on Admission: Ct Head Wo Contrast  Result Date: 07/14/2017 CLINICAL DATA:  Ct head wo, TIA, slurred speech for a couple of hours EXAM: CT HEAD WITHOUT CONTRAST TECHNIQUE: Contiguous axial images were obtained from the base of the skull through the vertex without intravenous contrast. COMPARISON:  None. FINDINGS: Brain: No acute intracranial hemorrhage. No focal mass lesion. No CT evidence of acute infarction. No midline shift or mass effect. No hydrocephalus. Basilar cisterns are patent. Age-appropriate cortical atrophy. Minimal white matter hypodensities. Vascular: No hyperdense vessel or unexpected calcification. Skull: Normal. Negative for fracture or focal lesion. Sinuses/Orbits: Paranasal sinuses and mastoid air cells are clear. Orbits are clear. Other: None. IMPRESSION: No acute intracranial findings. Mild atrophy and white matter microvascular disease. Electronically Signed   By: Suzy Bouchard M.D.   On: 07/14/2017 15:13    EKG: Independently  reviewed. NSR  Assessment/Plan Active Problems:   TIA (transient ischemic attack)   HTN (hypertension)  TIA -MRI (will need valium before)/echo/carotid -FLP/HgbA1c -ASA started  HTN -permissive HTN -resume metoprolol    DVT prophylaxis: lovenox Code Status: full Family Communication: at bedside Disposition Plan: obs Consults called: ER doc called neuro    [facility] Pager [phone]  If 7PM-7AM, please contact night-coverage [email or web address] Password [email or web address]  07/14/2017, 5:12 PM

## 2017-07-14 NOTE — ED Notes (Signed)
Patient transported to MRI 

## 2017-07-14 NOTE — ED Provider Notes (Signed)
[facility] DEPT Provider Note   CSN: [id number] Arrival date & time: 07/14/17  1344     History   Chief Complaint Chief Complaint  Patient presents with  . Transient Ischemic Attack    HPI Vanessa Knox is a 83 y.o. female.  HPI  83 year old female with past medical history of low back pain and sciatica presents to the emergency department secondary to a resolving issue with speaking. Patient states that for about 30-45 minutes she could not speak normally. She says that she knew tobacco she wanted to say but she could not make her mouth speak. This slowly resolved prior to my evaluation. The nurse reports the patient also had some issues with extinction. At this time patient is not claiming any chest pain, belly pain, urinary symptoms, infectious symptoms, leg swelling. She does have some sciatica pain which is normal for her. No history of anything similar to this. She now history of transient neurologic symptoms in any way. No other associated or modifying symptoms.  Past Medical History:  Diagnosis Date  . HTN (hypertension)   . Sciatica     Patient Active Problem List   Diagnosis Date Noted  . TIA (transient ischemic attack) 07/14/2017  . HTN (hypertension) 07/14/2017  . Compression fracture of L5 lumbar vertebra, sequela 01/02/2017  . Spinal stenosis of lumbar region with neurogenic claudication 01/02/2017  . Chronic low back pain with left-sided sciatica 01/02/2017    History reviewed. No pertinent surgical history.  OB History    No data available       Home Medications    Prior to Admission medications   Medication Sig Start Date End Date Taking? Authorizing Provider  acetaminophen (TYLENOL) 500 MG tablet Take 500 mg by mouth every 6 (six) hours as needed for mild pain.   Yes [provider]  Cyanocobalamin (B-12 PO) Take 1 tablet by mouth daily.   Yes [provider]  metoprolol succinate (TOPROL-XL) 25 MG 24 hr tablet Take 25 mg by  mouth daily.   Yes [provider]    Family History History reviewed. No pertinent family history.  Social History Social History  Substance Use Topics  . Smoking status: Never Smoker  . Smokeless tobacco: Never Used  . Alcohol use No     Allergies   Keflex [cephalexin]   Review of Systems Review of Systems  All other systems reviewed and are negative.    Physical Exam Updated Vital Signs BP (!) 151/65 (BP Location: Left Arm)   Pulse 72   Temp 97.8 F (36.6 C) (Oral)   Resp 19   SpO2 96%   Physical Exam  Constitutional: She appears well-developed and well-nourished.  HENT:  Head: Normocephalic and atraumatic.  Eyes: Conjunctivae and EOM are normal.  Neck: Normal range of motion.  Cardiovascular: Normal rate and regular rhythm.   Pulmonary/Chest: No stridor. No respiratory distress.  Abdominal: She exhibits no distension.  Neurological: She is alert.  Nursing note and vitals reviewed.    ED Treatments / Results  Labs (all labs ordered are listed, but only abnormal results are displayed) Labs Reviewed  COMPREHENSIVE METABOLIC PANEL - Abnormal; Notable for the following:       Result Value   Glucose, Bld 144 (*)    Calcium 8.8 (*)    Total Protein 6.4 (*)    ALT 12 (*)    GFR calc non Af Amer 48 (*)    GFR calc Af Amer 56 (*)    All  other components within normal limits  I-STAT CHEM 8, ED - Abnormal; Notable for the following:    BUN 22 (*)    Glucose, Bld 137 (*)    All other components within normal limits  MRSA PCR SCREENING  PROTIME-INR  APTT  CBC  DIFFERENTIAL  HEMOGLOBIN A1C  LIPID PANEL  I-STAT TROPONIN, ED  CBG MONITORING, ED    EKG  EKG Interpretation None       Radiology Ct Head Wo Contrast  Result Date: 07/14/2017 CLINICAL DATA:  Ct head wo, TIA, slurred speech for a couple of hours EXAM: CT HEAD WITHOUT CONTRAST TECHNIQUE: Contiguous axial images were obtained from the base of the skull through the vertex without  intravenous contrast. COMPARISON:  None. FINDINGS: Brain: No acute intracranial hemorrhage. No focal mass lesion. No CT evidence of acute infarction. No midline shift or mass effect. No hydrocephalus. Basilar cisterns are patent. Age-appropriate cortical atrophy. Minimal white matter hypodensities. Vascular: No hyperdense vessel or unexpected calcification. Skull: Normal. Negative for fracture or focal lesion. Sinuses/Orbits: Paranasal sinuses and mastoid air cells are clear. Orbits are clear. Other: None. IMPRESSION: No acute intracranial findings. Mild atrophy and white matter microvascular disease. Electronically Signed   By: Suzy Bouchard M.D.   On: 07/14/2017 15:13   Mr Brain Wo Contrast  Result Date: 07/14/2017 CLINICAL DATA:  Speech abnormality. EXAM: MRI HEAD WITHOUT CONTRAST TECHNIQUE: Multiplanar, multiecho pulse sequences of the brain and surrounding structures were obtained without intravenous contrast. COMPARISON:  Head CT 07/14/2017 FINDINGS: Brain: The midline structures are normal. There is no focal diffusion restriction to indicate acute infarct. There is multifocal hyperintense T2-weighted signal within the periventricular white matter, most often seen in the setting of chronic microvascular ischemia. No intraparenchymal hematoma or chronic microhemorrhage. Brain volume is normal for age without age-advanced or lobar predominant atrophy. The dura is normal and there is no extra-axial collection. Vascular: Major intracranial arterial and venous sinus flow voids are preserved. Skull and upper cervical spine: The visualized skull base, calvarium, upper cervical spine and extracranial soft tissues are normal. Sinuses/Orbits: No fluid levels or advanced mucosal thickening. No mastoid or middle ear effusion. Normal orbits. IMPRESSION: Mild chronic microvascular ischemia for age without acute abnormality. Electronically Signed   By: Ulyses Jarred M.D.   On: 07/14/2017 20:25     Procedures Procedures (including critical care time)  Medications Ordered in ED Medications  metoprolol succinate (TOPROL-XL) 24 hr tablet 25 mg (not administered)   stroke: mapping our early stages of recovery book (not administered)  acetaminophen (TYLENOL) tablet 650 mg (not administered)    Or  acetaminophen (TYLENOL) solution 650 mg (not administered)    Or  acetaminophen (TYLENOL) suppository 650 mg (not administered)  senna-docusate (Senokot-S) tablet 1 tablet (not administered)  enoxaparin (LOVENOX) injection 30 mg (not administered)  diazepam (VALIUM) tablet 2 mg (2 mg Oral Given 07/14/17 1649)     Initial Impression / Assessment and Plan / ED Course  I have reviewed the triage vital signs and the nursing notes.  Pertinent labs & imaging results that were available during my care of the patient were reviewed by me and considered in my medical decision making (see chart for details).     Suspect TIA, neurology consulted and will admit to [facility] for TIA workup.   Final Clinical Impressions(s) / ED Diagnoses   Final diagnoses:  Transient cerebral ischemia, unspecified type     Merrily Pew, MD 07/15/17 0020

## 2017-07-15 ENCOUNTER — Observation Stay (HOSPITAL_BASED_OUTPATIENT_CLINIC_OR_DEPARTMENT_OTHER): Payer: Medicare Other

## 2017-07-15 ENCOUNTER — Observation Stay (HOSPITAL_COMMUNITY): Payer: Medicare Other

## 2017-07-15 DIAGNOSIS — I340 Nonrheumatic mitral (valve) insufficiency: Secondary | ICD-10-CM

## 2017-07-15 DIAGNOSIS — R471 Dysarthria and anarthria: Secondary | ICD-10-CM

## 2017-07-15 DIAGNOSIS — G459 Transient cerebral ischemic attack, unspecified: Secondary | ICD-10-CM

## 2017-07-15 LAB — ECHOCARDIOGRAM COMPLETE
CHL CUP RV SYS PRESS: 28 mmHg
CHL CUP TV REG PEAK VELOCITY: 248 cm/s
E decel time: 204 msec
FS: 32 % (ref 28–44)
IVS/LV PW RATIO, ED: 1.06
LA ID, A-P, ES: 33 mm
LA diam end sys: 33 mm
LA diam index: 2.42 cm/m2
LA vol A4C: 43.2 ml
LA vol index: 30.8 mL/m2
LAVOL: 42.1 mL
LDCA: 2.84 cm2
LV PW d: 16 mm — AB (ref 0.6–1.1)
LV TDI E'LATERAL: 7.53
LV e' LATERAL: 7.53 cm/s
LVOT VTI: 19.6 cm
LVOT peak grad rest: 3 mmHg
LVOTD: 19 mm
LVOTPV: 79.1 cm/s
LVOTSV: 56 mL
MV Dec: 204
MVPKEVEL: 0.7 m/s
RV LATERAL S' VELOCITY: 14.8 cm/s
TAPSE: 25.2 mm
TDI e' medial: 4.99
TR max vel: 248 cm/s

## 2017-07-15 LAB — LIPID PANEL
CHOLESTEROL: 237 mg/dL — AB (ref 0–200)
HDL: 45 mg/dL (ref >40)
LDL Cholesterol: 150 mg/dL — ABNORMAL HIGH (ref 0–99)
Total CHOL/HDL Ratio: 5.3 RATIO
Triglycerides: 208 mg/dL — ABNORMAL HIGH (ref <150)
VLDL: 42 mg/dL — AB (ref 0–40)

## 2017-07-15 LAB — MRSA PCR SCREENING: MRSA BY PCR: NEGATIVE

## 2017-07-15 MED ORDER — ASPIRIN EC 325 MG PO TBEC: 325.0000 mg | DELAYED_RELEASE_TABLET | Freq: Every day | ORAL | 0 refills | Status: AC

## 2017-07-15 NOTE — H&P (Signed)
History and Physical    Vanessa Knox MBW:[id number] DOB: [birth year] DOA: 07/14/2017  Referring MD/NP/PA: er PCP: Vanessa Battles, MD Outpatient Specialists: Vanessa Knox (pain) Patient coming from:   Chief Complaint: speech difficulty   HPI: Vanessa Knox is a 83 y.o. female with medical history significant of HTN and sciatica.  She lives in an ALF and was at lunch today when she has speaking difficulty.  Episode lasted 30-45 minutes.  During the time, she knew what she wanted to say but was unable to what she was thinking.  It resolved by the time patient reached the ER.    In the ER, CT scan was normal.   Labs unremarkable and hospitalist were asked to place in observation for TIA/CVA. No fever, no headache.  Review of Systems: all systems reviewed, negative unless stated above in HPI   Past Medical History:  Diagnosis Date  . HTN (hypertension)   . Sciatica     History reviewed. No pertinent surgical history.   reports that she has never smoked. She has never used smokeless tobacco. She reports that she does not drink alcohol or use drugs.  Allergies  Allergen Reactions  . Keflex [Cephalexin] Diarrhea and Other (See Comments)    Headaches and dizziness also     Family hx: no contributory   Prior to Admission medications   Medication Sig Start Date End Date Taking? Authorizing Provider  HYDROcodone-acetaminophen (NORCO/VICODIN) 5-325 MG tablet Take 1 tablet by mouth every 6 (six) hours as needed for moderate pain.    [provider]  metoprolol succinate (TOPROL-XL) 25 MG 24 hr tablet Take 25 mg by mouth daily.    [provider]    Physical Exam: Vitals:   07/14/17 1845 07/14/17 2323 07/15/17 0423 07/15/17 1517  BP: (!) 208/98 (!) 151/65 (!) 147/70 (!) 103/59  Pulse: 86 72 67 80  Resp: 19  18 18   Temp:  97.8 F (36.6 C) (!) 97.5 F (36.4 C) 97.9 F (36.6 C)  TempSrc:  Oral Oral Oral  SpO2: 99% 96% 98% 97%      Constitutional: NAD, calm,  comfortable- younger than stated age Vitals:   07/14/17 1845 07/14/17 2323 07/15/17 0423 07/15/17 1517  BP: (!) 208/98 (!) 151/65 (!) 147/70 (!) 103/59  Pulse: 86 72 67 80  Resp: 19  18 18   Temp:  97.8 F (36.6 C) (!) 97.5 F (36.4 C) 97.9 F (36.6 C)  TempSrc:  Oral Oral Oral  SpO2: 99% 96% 98% 97%   Eyes: PERRL, lids and conjunctivae normal ENMT: Mucous membranes are moist. Posterior pharynx clear of any exudate or lesions.Normal dentition.  Neck: normal, supple, no masses, no thyromegaly Respiratory: clear to auscultation bilaterally, no wheezing, no crackles. Normal respiratory effort. No accessory muscle use.  Cardiovascular: Regular rate and rhythm, no murmurs / rubs / gallops. No extremity edema. 2+ pedal pulses. No carotid bruits.  Abdomen: no tenderness, no masses palpated. No hepatosplenomegaly. Bowel sounds positive.  Musculoskeletal: no clubbing / cyanosis. No joint deformity upper and lower extremities. Good ROM, no contractures. Normal muscle tone.  Skin: no rashes, lesions, ulcers. No induration Neurologic: CN 2-12 grossly intact. Sensation intact, DTR normal. Strength 5/5 in all 4.  Psychiatric: Normal judgment and insight. Alert and oriented x 3. Normal mood.    Labs on Admission: I have personally reviewed following labs and imaging studies  CBC:  Recent Labs Lab 07/14/17 1403 07/14/17 1457  WBC 5.9  --   NEUTROABS 3.5  --  HGB 12.7 13.6  HCT 41.0 40.0  MCV 90.9  --   PLT 258  --    Basic Metabolic Panel:  Recent Labs Lab 07/14/17 1403 07/14/17 1457  NA 139 142  K 3.9 3.9  CL 104 102  CO2 28  --   GLUCOSE 144* 137*  BUN 19 22*  CREATININE 0.95 1.00  CALCIUM 8.8*  --    GFR: CrCl cannot be calculated (Unknown ideal weight.). Liver Function Tests:  Recent Labs Lab 07/14/17 1403  AST 20  ALT 12*  ALKPHOS 79  BILITOT 0.6  PROT 6.4*  ALBUMIN 3.8   No results for input(s): LIPASE, AMYLASE in the last 168 hours. No results for  input(s): AMMONIA in the last 168 hours. Coagulation Profile:  Recent Labs Lab 07/14/17 1403  INR 1.00   Cardiac Enzymes: No results for input(s): CKTOTAL, CKMB, CKMBINDEX, TROPONINI in the last 168 hours. BNP (last 3 results) No results for input(s): PROBNP in the last 8760 hours. HbA1C: No results for input(s): HGBA1C in the last 72 hours. CBG: No results for input(s): GLUCAP in the last 168 hours. Lipid Profile:  Recent Labs  07/15/17 0415  CHOL 237*  HDL 45  LDLCALC 150*  TRIG 208*  CHOLHDL 5.3   Thyroid Function Tests: No results for input(s): TSH, T4TOTAL, FREET4, T3FREE, THYROIDAB in the last 72 hours. Anemia Panel: No results for input(s): VITAMINB12, FOLATE, FERRITIN, TIBC, IRON, RETICCTPCT in the last 72 hours. Urine analysis: No results found for: COLORURINE, APPEARANCEUR, LABSPEC, PHURINE, GLUCOSEU, HGBUR, BILIRUBINUR, KETONESUR, PROTEINUR, UROBILINOGEN, NITRITE, LEUKOCYTESUR Sepsis Labs: Invalid input(s): PROCALCITONIN, LACTICIDVEN Recent Results (from the past 240 hour(s))  MRSA PCR Screening     Status: None   Collection Time: 07/15/17  7:34 AM  Result Value Ref Range Status   MRSA by PCR NEGATIVE NEGATIVE Final    Comment:        The GeneXpert MRSA Assay (FDA approved for NASAL specimens only), is one component of a comprehensive MRSA colonization surveillance program. It is not intended to diagnose MRSA infection nor to guide or monitor treatment for MRSA infections.      Radiological Exams on Admission: Ct Head Wo Contrast  Result Date: 07/14/2017 CLINICAL DATA:  Ct head wo, TIA, slurred speech for a couple of hours EXAM: CT HEAD WITHOUT CONTRAST TECHNIQUE: Contiguous axial images were obtained from the base of the skull through the vertex without intravenous contrast. COMPARISON:  None. FINDINGS: Brain: No acute intracranial hemorrhage. No focal mass lesion. No CT evidence of acute infarction. No midline shift or mass effect. No  hydrocephalus. Basilar cisterns are patent. Age-appropriate cortical atrophy. Minimal white matter hypodensities. Vascular: No hyperdense vessel or unexpected calcification. Skull: Normal. Negative for fracture or focal lesion. Sinuses/Orbits: Paranasal sinuses and mastoid air cells are clear. Orbits are clear. Other: None. IMPRESSION: No acute intracranial findings. Mild atrophy and white matter microvascular disease. Electronically Signed   By: Suzy Bouchard M.D.   On: 07/14/2017 15:13   Mr Brain Wo Contrast  Result Date: 07/14/2017 CLINICAL DATA:  Speech abnormality. EXAM: MRI HEAD WITHOUT CONTRAST TECHNIQUE: Multiplanar, multiecho pulse sequences of the brain and surrounding structures were obtained without intravenous contrast. COMPARISON:  Head CT 07/14/2017 FINDINGS: Brain: The midline structures are normal. There is no focal diffusion restriction to indicate acute infarct. There is multifocal hyperintense T2-weighted signal within the periventricular white matter, most often seen in the setting of chronic microvascular ischemia. No intraparenchymal hematoma or chronic microhemorrhage. Brain volume is normal  for age without age-advanced or lobar predominant atrophy. The dura is normal and there is no extra-axial collection. Vascular: Major intracranial arterial and venous sinus flow voids are preserved. Skull and upper cervical spine: The visualized skull base, calvarium, upper cervical spine and extracranial soft tissues are normal. Sinuses/Orbits: No fluid levels or advanced mucosal thickening. No mastoid or middle ear effusion. Normal orbits. IMPRESSION: Mild chronic microvascular ischemia for age without acute abnormality. Electronically Signed   By: Ulyses Jarred M.D.   On: 07/14/2017 20:25   Mr Jodene Nam Head/brain OY Cm  Result Date: 07/15/2017 CLINICAL DATA:  Speech abnormality.  Abnormal MRI. EXAM: MRA HEAD WITHOUT CONTRAST TECHNIQUE: Angiographic images of the Circle of Willis were obtained  using MRA technique without intravenous contrast. COMPARISON:  MRI brain 07/14/2017 FINDINGS: The internal carotid artery is are within normal limits from high cervical segments through the ICA termini bilaterally. The A1 and M1 segments are normal. The anterior communicating artery is patent. The MCA bifurcations are intact bilaterally. There is early termination of a superior left M2 branch. This may represent occlusion or high-grade stenosis. MCA branch vessels are otherwise intact. The vertebral arteries are codominant. The left PICA origin is within normal limits. The right PICA origin is below the field of view. The vertebral basilar artery is normal. Both posterior cerebral arteries originate at basilar tip. IMPRESSION: 1. 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 There is no associated infarct on the MRI. 2. Mild distal small vessel disease without other significant proximal stenosis, aneurysm, or branch vessel occlusion. Electronically Signed   By: San Morelle M.D.   On: 07/15/2017 07:15    EKG: Independently reviewed. NSR  Assessment/Plan Active Problems:   TIA (transient ischemic attack)   HTN (hypertension)  TIA -MRI (will need valium before)/echo/carotid -FLP/HgbA1c -ASA started  HTN -permissive HTN -resume metoprolol    DVT prophylaxis: lovenox Code Status: full Family Communication: at bedside Disposition Plan: obs Consults called: ER doc called neuro    [facility] Pager [phone]  If 7PM-7AM, please contact night-coverage [email or web address] Password [email or web address]  07/15/2017, 3:50 PM

## 2017-07-15 NOTE — Evaluation (Addendum)
Occupational Therapy Evaluation Patient Details Name: Vanessa Knox MRN: [id number] DOB: 1916-05-21 Today's Date: 07/15/2017    History of Present Illness 83 y.o. female with medical history significant of HTN and sciatica.  She lives in an [facility] and was at lunch today when she has speaking difficulty.  Episode lasted 30-45 minutes.  During the time, she knew what she wanted to say but was unable to what she was thinking.  It resolved by the time patient reached the ER. CT scan was normal and MRI negative for acute findings.   Clinical Impression   This 83 y/o F presents with the above. Pt lives in an independent living facility, reports at baseline she is mod independent with ADLs and functional mobility. Pt currently requires MinA for functional mobility using RW and LB ADLs. Pt will benefit from continued OT services in acute setting and will benefit from further OT services after return home to maximize Pt's safety and independence with ADLs and functional mobility.     Follow Up Recommendations  Home health OT;Supervision - Intermittent (ILF may offer therapy services )    Equipment Recommendations  None recommended by OT           Precautions / Restrictions Precautions Precautions: Fall Restrictions Weight Bearing Restrictions: No      Mobility Bed Mobility               General bed mobility comments: Pt OOB in recliner   Transfers Overall transfer level: Needs assistance Equipment used: Rolling walker (2 wheeled) Transfers: Sit to/from Stand Sit to Stand: Min guard         General transfer comment: verbal cues for safety as Pt attempting to stand prior to therapist placing walker in front of her     Balance Overall balance assessment: Needs assistance Sitting-balance support: Feet supported;Bilateral upper extremity supported Sitting balance-Leahy Scale: Fair   Postural control: Right lateral lean;Other (comment) (suspect due to sciatica pain ) Standing  balance support: Bilateral upper extremity supported;During functional activity Standing balance-Leahy Scale: Poor Standing balance comment: reliant on UE support during mobility; utilizes sink for UE support while washing hands in standing                            ADL either performed or assessed with clinical judgement   ADL Overall ADL's : Needs assistance/impaired Eating/Feeding: Set up;Sitting   Grooming: Wash/dry hands;Min guard;Standing   Upper Body Bathing: Min guard;Sitting   Lower Body Bathing: Minimal assistance;Sit to/from stand   Upper Body Dressing : Min guard;Sitting   Lower Body Dressing: Minimal assistance;Sit to/from stand   Toilet Transfer: Minimal assistance;Ambulation;Grab bars;RW;Comfort height toilet   Toileting- Clothing Manipulation and Hygiene: Minimal assistance;Sit to/from stand Toileting - Clothing Manipulation Details (indicate cue type and reason): assist for gown management      Functional mobility during ADLs: Minimal assistance;Rolling walker       Vision Baseline Vision/History:  (wears contacts ) Patient Visual Report: No change from baseline Vision Assessment?: No apparent visual deficits                Pertinent Vitals/Pain Pain Assessment: Faces Faces Pain Scale: Hurts a little bit Pain Location: sciatica pain  Pain Descriptors / Indicators: Aching;Sore Pain Intervention(s): Limited activity within patient's tolerance;Monitored during session;Repositioned     Hand Dominance Right   Extremity/Trunk Assessment Upper Extremity Assessment Upper Extremity Assessment: RUE deficits/detail;LUE deficits/detail RUE Deficits / Details: limited AROM due to  hx of rotator cuff injuries, however appears WFL RUE Coordination: decreased fine motor LUE Deficits / Details: limited AROM due to hx of rotator cuff injuries, however appears WFL  LUE Coordination: decreased fine motor   Lower Extremity Assessment Lower Extremity  Assessment: Defer to PT evaluation       Communication Communication Communication: No difficulties   Cognition Arousal/Alertness: Awake/alert Behavior During Therapy: WFL for tasks assessed/performed Overall Cognitive Status: Within Functional Limits for tasks assessed                                                      Home Living Family/patient expects to be discharged to:: Assisted living ([facility] ) Living Arrangements: Alone (ILF staff check in intermittently ) Available Help at Discharge: Available PRN/intermittently Type of Home: Independent living facility Home Access: Elevator;Other (comment) (Pt lives on 3rd floor, uses elevator, level entry to enter building)     Home Layout: One level     Bathroom Shower/Tub: Occupational psychologist: Standard     Home Equipment: Shower seat;Grab bars - toilet;Grab bars - tub/shower;Toilet riser;Other (comment)          Prior Functioning/Environment Level of Independence: Independent with assistive device(s)        Comments: ILF staff completes light housekeeping; provides 3 meals/day; staff checks in intermittently; Pt reports she uses walker in apartment and to get to dining area in ILF, uses manual w/c for further distances         OT Problem List: Decreased strength;Decreased activity tolerance      OT Treatment/Interventions: Self-care/ADL training;Therapeutic activities;DME and/or AE instruction;Balance training;Therapeutic exercise;Patient/family education    OT Goals(Current goals can be found in the care plan section) Acute Rehab OT Goals Patient Stated Goal: to continue being independent  OT Goal Formulation: With patient Time For Goal Achievement: 07/29/17 Potential to Achieve Goals: Good ADL Goals Pt Will Perform Grooming: with modified independence;standing Pt Will Perform Upper Body Bathing: with modified independence;sitting Pt Will Perform Lower  Body Bathing: with modified independence;sit to/from stand Pt Will Perform Upper Body Dressing: with modified independence;sitting Pt Will Perform Lower Body Dressing: with modified independence;sit to/from stand Pt Will Transfer to Toilet: with modified independence;ambulating;bedside commode (BSC over toilet ) Pt Will Perform Toileting - Clothing Manipulation and hygiene: with modified independence;sit to/from stand  OT Frequency: Min 2X/week                             AM-PAC PT "6 Clicks" Daily Activity     Outcome Measure Help from another person eating meals?: None Help from another person taking care of personal grooming?: A Little Help from another person toileting, which includes using toliet, bedpan, or urinal?: A Little Help from another person bathing (including washing, rinsing, drying)?: A Little Help from another person to put on and taking off regular upper body clothing?: A Little Help from another person to put on and taking off regular lower body clothing?: A Little 6 Click Score: 19   End of Session Equipment Utilized During Treatment: Gait belt;Rolling walker Nurse Communication: Mobility status  Activity Tolerance: Patient tolerated treatment well Patient left: in chair;with call bell/phone within reach  OT Visit Diagnosis: Unsteadiness on feet (R26.81);Muscle weakness (generalized) (M62.81)  Time: 1033-1101 OT Time Calculation (min): 28 min Charges:  OT General Charges $OT Visit: 1 Procedure OT Evaluation $OT Eval Low Complexity: 1 Procedure OT Treatments $Self Care/Home Management : 8-22 mins G-Codes: OT G-codes **NOT FOR INPATIENT CLASS** Functional Assessment Tool Used: AM-PAC 6 Clicks Daily Activity;Clinical judgement Functional Limitation: Self care Self Care Current Status (Q5956): At least 20 percent but less than 40 percent impaired, limited or restricted Self Care Goal Status (L8756): At least 1 percent but less than 20  percent impaired, limited or restricted   Lou Cal, OT Pager [phone] 07/15/2017   Raymondo Band 07/15/2017, 1:04 PM

## 2017-07-15 NOTE — Progress Notes (Signed)
SLP Cancellation Note  Patient Details Name: Vanessa Knox MRN: [id number] DOB: 1916/07/28   Cancelled treatment:       Reason Eval/Treat Not Completed: SLP screened, no needs identified, will sign off. Pt alert, oriented, conversant at bedside. Reported that speech/ language symptoms have resolved. MRI negative for acute findings. Will sign off at this time; please re-consult if needs arise.   Kern Reap, [place], CCC-SLP 07/15/2017, 11:29 AM [phone]

## 2017-07-15 NOTE — Progress Notes (Signed)
*  PRELIMINARY RESULTS* Vascular Ultrasound Carotid Duplex (Doppler) has been completed.  Preliminary findings: Bilateral: elevated velocities at mid ICA in the 40-59% range without evidence of plaque formation to suggest stenosis. Etiology unknown.   Landry Mellow, RDMS, RVT  07/15/2017, 9:32 AM

## 2017-07-15 NOTE — Evaluation (Signed)
Physical Therapy Evaluation Patient Details Name: Vanessa Knox MRN: [id number] DOB: 14-Jun-1916 Today's Date: 07/15/2017   History of Present Illness  83 y.o. female with medical history significant of HTN and sciatica.  She lives in an [place] and was at lunch today when she has speaking difficulty.  Episode lasted 30-45 minutes.  During the time, she knew what she wanted to say but was unable to what she was thinking.  It resolved by the time patient reached the ER. CT scan was normal and MRI negative for acute findings.  Clinical Impression  Patient is functioning at her baseline level for mobility/gait - Mod I.  Patient has had no falls in past.  ILF staff check on patient intermittently. Son and daughter-in-law assist patient as needed.  Patient's symptoms have resolved per patient.  No further skilled PT needed at this time.  PT will sign off.    Follow Up Recommendations No PT follow up;Supervision - Intermittent    Equipment Recommendations  None recommended by PT    Recommendations for Other Services       Precautions / Restrictions Precautions Precautions: Fall Restrictions Weight Bearing Restrictions: No      Mobility  Bed Mobility               General bed mobility comments: Patient in chair  Transfers Overall transfer level: Modified independent Equipment used: Rolling walker (2 wheeled) Transfers: Sit to/from Stand Sit to Stand: Modified independent (Device/Increase time)         General transfer comment: Cues to scoot to edge of chair.  Patient able to stand with no physical assist.  Good static standing balance.  Ambulation/Gait Ambulation/Gait assistance: Modified independent (Device/Increase time) Ambulation Distance (Feet): 52 Feet Assistive device: Rolling walker (2 wheeled) Gait Pattern/deviations: Step-through pattern;Decreased stride length;Shuffle Gait velocity: slower Gait velocity interpretation: at or above normal speed for  age/gender General Gait Details: Patient demonstrates safe use of RW.  No loss of balance during gait or turns.   Stairs            Wheelchair Mobility    Modified Rankin (Stroke Patients Only) Modified Rankin (Stroke Patients Only) Pre-Morbid Rankin Score: Moderate disability Modified Rankin: Moderate disability     Balance Overall balance assessment: Needs assistance Sitting-balance support: Feet supported;Bilateral upper extremity supported Sitting balance-Leahy Scale: Good   Postural control: Right lateral lean;Other (comment) (suspect due to sciatica pain ) Standing balance support: Bilateral upper extremity supported;During functional activity Standing balance-Leahy Scale: Fair Standing balance comment: Static stance with no UE support.  Reliant on UE support for dynamic activities.                             Pertinent Vitals/Pain Pain Assessment: No/denies pain (Reports sciatica pain if "off and on") Faces Pain Scale: Hurts a little bit Pain Location: sciatica pain  Pain Descriptors / Indicators: Aching;Sore Pain Intervention(s): Limited activity within patient's tolerance;Monitored during session;Repositioned    Home Living Family/patient expects to be discharged to:: Other (Comment) (ILF) Living Arrangements: Alone (ILF staff check on patient intermittently) Available Help at Discharge: Family;Available PRN/intermittently (ILF staff; son and daughter-in-law) Type of Home: Independent living facility Home Access: Level entry;Elevator     Home Layout: One level Home Equipment: Walker - 4 wheels;Electric scooter;Shower seat;Toilet riser;Grab bars - toilet;Grab bars - tub/shower      Prior Function Level of Independence: Independent with assistive device(s)  Comments: Rollator for short distances; Electric scooter for longer distances.  ILF provides meals and housekeeping.     Hand Dominance   Dominant Hand: Right     Extremity/Trunk Assessment   Upper Extremity Assessment Upper Extremity Assessment: Defer to OT evaluation RUE Deficits / Details: limited AROM due to hx of rotator cuff injuries, however appears [facility] RUE Coordination: decreased fine motor LUE Deficits / Details: limited AROM due to hx of rotator cuff injuries, however appears WFL  LUE Coordination: decreased fine motor    Lower Extremity Assessment Lower Extremity Assessment: Generalized weakness       Communication   Communication: No difficulties  Cognition Arousal/Alertness: Awake/alert Behavior During Therapy: WFL for tasks assessed/performed Overall Cognitive Status: Within Functional Limits for tasks assessed                                        General Comments      Exercises     Assessment/Plan    PT Assessment Patent does not need any further PT services  PT Problem List         PT Treatment Interventions      PT Goals (Current goals can be found in the Care Plan section)  Acute Rehab PT Goals Patient Stated Goal: to continue being independent  PT Goal Formulation: All assessment and education complete, DC therapy    Frequency     Barriers to discharge        Co-evaluation               AM-PAC PT "6 Clicks" Daily Activity  Outcome Measure Difficulty turning over in bed (including adjusting bedclothes, sheets and blankets)?: None Difficulty moving from lying on back to sitting on the side of the bed? : A Little Difficulty sitting down on and standing up from a chair with arms (e.g., wheelchair, bedside commode, etc,.)?: None Help needed moving to and from a bed to chair (including a wheelchair)?: None Help needed walking in hospital room?: None Help needed climbing 3-5 steps with a railing? : A Little 6 Click Score: 22    End of Session Equipment Utilized During Treatment: Gait belt Activity Tolerance: Patient tolerated treatment well Patient left: in chair;with call  bell/phone within reach Nurse Communication: Mobility status PT Visit Diagnosis: Muscle weakness (generalized) (M62.81)    Time: 9622-2979 PT Time Calculation (min) (ACUTE ONLY): 14 min   Charges:   PT Evaluation $PT Eval Low Complexity: 1 Low     PT G Codes:   PT G-Codes **NOT FOR INPATIENT CLASS** Functional Assessment Tool Used: AM-PAC 6 Clicks Basic Mobility Functional Limitation: Mobility: Walking and moving around Mobility: Walking and Moving Around Current Status (G9211): At least 1 percent but less than 20 percent impaired, limited or restricted Mobility: Walking and Moving Around Goal Status [phone]): At least 1 percent but less than 20 percent impaired, limited or restricted Mobility: Walking and Moving Around Discharge Status [phone]): At least 1 percent but less than 20 percent impaired, limited or restricted    Carita Pian. Sanjuana Kava, [facility] Acute Rehab Services Pager [phone]   Despina Pole 07/15/2017, 2:56 PM

## 2017-07-15 NOTE — Progress Notes (Signed)
Patient was discharged home by MD order; discharged instructions  review and give to patient and her son  with care notes and prescription; IV DIC; patient will be escorted to the car by nurse tech via wheelchair.

## 2017-07-15 NOTE — Discharge Summary (Signed)
Physician Discharge Summary  Vanessa Knox XNT:[id number] DOB: 02-Jun-1916 DOA: 07/14/2017  PCP: Leanna Battles, MD  Admit date: 07/14/2017 Discharge date: 07/15/2017   Recommendations for Outpatient Follow-Up:   1. ASA started- once records regarding GI bleed in 2017 from [facility] have been received would recommend ASA/plavix x 3 months followed by ASA alone 2. Neurology outpatient referred   Discharge Diagnosis:   Active Problems:   TIA (transient ischemic attack)   HTN (hypertension)   Discharge disposition:  Home  Discharge Condition: Improved.  Diet recommendation: Low sodium, heart healthy  Wound care: None.   History of Present Illness:   Vanessa Knox is a 83 y.o. female with medical history significant of HTN and sciatica.  She lives in an ALF and was at lunch today when she has speaking difficulty.  Episode lasted 30-45 minutes.  During the time, she knew what she wanted to say but was unable to what she was thinking.  It resolved by the time patient reached the ER.    In the ER, CT scan was normal.   Labs unremarkable and hospitalist were asked to place in observation for TIA/CVA. No fever, no headache.   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  Will defer to PCP changing meds once records are revieved -FLP: LDL 150-- patient resistant to statin -HgbA1c pending -ASA started -outpatient neurology referral placed -echo: Left ventricle: The cavity size was normal. Wall thickness was   normal. Systolic function was normal. The estimated ejection   fraction was in the range of 60% to 65%. Wall motion was normal;   there were no regional wall motion abnormalities. Doppler   parameters are consistent with abnormal  left ventricular   relaxation (grade 1 diastolic dysfunction). -carotid: Bilateral: elevated velocities at mid ICA in the 40-59% range without evidence of plaque formation to suggest stenosis -avoid hypotension   HTN -permissive HTN -resume metoprolol    Medical Consultants:    Neuro (phone)   Discharge Exam:   Vitals:   07/14/17 2323 07/15/17 0423  BP: (!) 151/65 (!) 147/70  Pulse: 72 67  Resp:  18  Temp: 97.8 F (36.6 C) (!) 97.5 F (36.4 C)   Vitals:   07/14/17 1830 07/14/17 1845 07/14/17 2323 07/15/17 0423  BP: (!) 171/75 (!) 208/98 (!) 151/65 (!) 147/70  Pulse: 64 86 72 67  Resp: 18 19  18   Temp:   97.8 F (36.6 C) (!) 97.5 F (36.4 C)  TempSrc:   Oral Oral  SpO2: 98% 99% 96% 98%    Gen:  NAD   The results of significant diagnostics from this hospitalization (including imaging, microbiology, ancillary and laboratory) are listed below for reference.     Procedures and Diagnostic Studies:   Ct Head Wo Contrast  Result Date: 07/14/2017 CLINICAL DATA:  Ct head wo, TIA, slurred speech for a couple of hours EXAM: CT HEAD WITHOUT CONTRAST TECHNIQUE: Contiguous axial images were obtained from the base of the skull through the vertex without intravenous contrast. COMPARISON:  None. FINDINGS: Brain: No acute intracranial hemorrhage. No focal mass lesion. No CT evidence of acute infarction. No midline shift or mass effect. No hydrocephalus. Basilar cisterns are patent. Age-appropriate cortical atrophy. Minimal white matter hypodensities. Vascular:  No hyperdense vessel or unexpected calcification. Skull: Normal. Negative for fracture or focal lesion. Sinuses/Orbits: Paranasal sinuses and mastoid air cells are clear. Orbits are clear. Other: None. IMPRESSION: No acute intracranial findings. Mild atrophy and white matter microvascular disease. Electronically Signed   By: Suzy Bouchard M.D.   On: 07/14/2017 15:13   Mr Brain Wo Contrast  Result Date:  07/14/2017 CLINICAL DATA:  Speech abnormality. EXAM: MRI HEAD WITHOUT CONTRAST TECHNIQUE: Multiplanar, multiecho pulse sequences of the brain and surrounding structures were obtained without intravenous contrast. COMPARISON:  Head CT 07/14/2017 FINDINGS: Brain: The midline structures are normal. There is no focal diffusion restriction to indicate acute infarct. There is multifocal hyperintense T2-weighted signal within the periventricular white matter, most often seen in the setting of chronic microvascular ischemia. No intraparenchymal hematoma or chronic microhemorrhage. Brain volume is normal for age without age-advanced or lobar predominant atrophy. The dura is normal and there is no extra-axial collection. Vascular: Major intracranial arterial and venous sinus flow voids are preserved. Skull and upper cervical spine: The visualized skull base, calvarium, upper cervical spine and extracranial soft tissues are normal. Sinuses/Orbits: No fluid levels or advanced mucosal thickening. No mastoid or middle ear effusion. Normal orbits. IMPRESSION: Mild chronic microvascular ischemia for age without acute abnormality. Electronically Signed   By: Ulyses Jarred M.D.   On: 07/14/2017 20:25   Mr Jodene Nam Head/brain QJ Cm  Result Date: 07/15/2017 CLINICAL DATA:  Speech abnormality.  Abnormal MRI. EXAM: MRA HEAD WITHOUT CONTRAST TECHNIQUE: Angiographic images of the Circle of Willis were obtained using MRA technique without intravenous contrast. COMPARISON:  MRI brain 07/14/2017 FINDINGS: The internal carotid artery is are within normal limits from high cervical segments through the ICA termini bilaterally. The A1 and M1 segments are normal. The anterior communicating artery is patent. The MCA bifurcations are intact bilaterally. There is early termination of a superior left M2 branch. This may represent occlusion or high-grade stenosis. MCA branch vessels are otherwise intact. The vertebral arteries are codominant. The left  PICA origin is within normal limits. The right PICA origin is below the field of view. The vertebral basilar artery is normal. Both posterior cerebral arteries originate at basilar tip. IMPRESSION: 1. 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 There is no associated infarct on the MRI. 2. Mild distal small vessel disease without other significant proximal stenosis, aneurysm, or branch vessel occlusion. Electronically Signed   By: San Morelle M.D.   On: 07/15/2017 07:15     Labs:   Basic Metabolic Panel:  Recent Labs Lab 07/14/17 1403 07/14/17 1457  NA 139 142  K 3.9 3.9  CL 104 102  CO2 28  --   GLUCOSE 144* 137*  BUN 19 22*  CREATININE 0.95 1.00  CALCIUM 8.8*  --    GFR CrCl cannot be calculated (Unknown ideal weight.). Liver Function Tests:  Recent Labs Lab 07/14/17 1403  AST 20  ALT 12*  ALKPHOS 79  BILITOT 0.6  PROT 6.4*  ALBUMIN 3.8   No results for input(s): LIPASE, AMYLASE in the last 168 hours. No results for input(s): AMMONIA in the last 168 hours. Coagulation profile  Recent Labs Lab 07/14/17 1403  INR 1.00    CBC:  Recent Labs Lab 07/14/17 1403 07/14/17 1457  WBC 5.9  --   NEUTROABS 3.5  --   HGB 12.7 13.6  HCT 41.0 40.0  MCV 90.9  --   PLT 258  --    Cardiac Enzymes: No results  for input(s): CKTOTAL, CKMB, CKMBINDEX, TROPONINI in the last 168 hours. BNP: Invalid input(s): POCBNP CBG: No results for input(s): GLUCAP in the last 168 hours. D-Dimer No results for input(s): DDIMER in the last 72 hours. Hgb A1c No results for input(s): HGBA1C in the last 72 hours. Lipid Profile  Recent Labs  07/15/17 0415  CHOL 237*  HDL 45  LDLCALC 150*  TRIG 208*  CHOLHDL 5.3   Thyroid function studies No results for input(s): TSH, T4TOTAL, T3FREE, THYROIDAB in the last 72 hours.  Invalid input(s): FREET3 Anemia work up No results for input(s): VITAMINB12, FOLATE, FERRITIN, TIBC, IRON, RETICCTPCT in the last 72  hours. Microbiology Recent Results (from the past 240 hour(s))  MRSA PCR Screening     Status: None   Collection Time: 07/15/17  7:34 AM  Result Value Ref Range Status   MRSA by PCR NEGATIVE NEGATIVE Final    Comment:        The GeneXpert MRSA Assay (FDA approved for NASAL specimens only), is one component of a comprehensive MRSA colonization surveillance program. It is not intended to diagnose MRSA infection nor to guide or monitor treatment for MRSA infections.      Discharge Instructions:   Discharge Instructions    Ambulatory referral to Neurology    Complete by:  As directed    An appointment is requested in approximately: 6 weeks   Diet - low sodium heart healthy    Complete by:  As directed    Increase activity slowly    Complete by:  As directed      Allergies as of 07/15/2017      Reactions   Keflex [cephalexin] Diarrhea, Other (See Comments)   Headaches and dizziness also      Medication List    TAKE these medications   acetaminophen 500 MG tablet Commonly known as:  TYLENOL Take 500 mg by mouth every 6 (six) hours as needed for mild pain.   aspirin EC 325 MG tablet Take 1 tablet (325 mg total) by mouth daily.   B-12 PO Take 1 tablet by mouth daily.   metoprolol succinate 25 MG 24 hr tablet Commonly known as:  TOPROL-XL Take 25 mg by mouth daily.      Follow-up Information    Leanna Battles, MD Follow up in 1 week(s).   Specialty:  Internal Medicine Contact information: [address] [place] [address] [phone]            Time coordinating discharge: 35 min  Signed:  Darrill Vreeland Alison Stalling   [facility] 07/15/2017, 2:36 PM

## 2017-07-15 NOTE — Progress Notes (Signed)
  Echocardiogram 2D Echocardiogram has been performed.  Vanessa Knox 07/15/2017, 9:43 AM

## 2017-07-16 LAB — VAS US CAROTID
LCCADSYS: 63 cm/s
LCCAPDIAS: 19 cm/s
LEFT ECA DIAS: -2 cm/s
LEFT VERTEBRAL DIAS: 13 cm/s
LICADDIAS: -24 cm/s
LICADSYS: -100 cm/s
LICAPDIAS: -11 cm/s
LICAPSYS: -50 cm/s
Left CCA dist dias: 11 cm/s
Left CCA prox sys: 78 cm/s
RIGHT VERTEBRAL DIAS: 12 cm/s
Right CCA prox dias: 14 cm/s
Right CCA prox sys: 48 cm/s
Right cca dist sys: -84 cm/s

## 2017-07-16 LAB — HEMOGLOBIN A1C
Hgb A1c MFr Bld: 6 % — ABNORMAL HIGH (ref 4.8–5.6)
Mean Plasma Glucose: 126 mg/dL

## 2017-08-13 NOTE — Telephone Encounter (Signed)
Closing call.

## 2017-09-03 ENCOUNTER — Ambulatory Visit: Payer: Medicare Other | Admitting: Diagnostic Neuroimaging

## 2018-11-20 ENCOUNTER — Inpatient Hospital Stay (HOSPITAL_COMMUNITY)
Admission: EM | Admit: 2018-11-20 | Discharge: 2018-11-25 | DRG: 956 | Disposition: A | Discharge status: Skilled Nursing Facility | Payer: Medicare Other | Attending: Internal Medicine | Admitting: Internal Medicine

## 2018-11-20 ENCOUNTER — Emergency Department (HOSPITAL_COMMUNITY): Payer: Medicare Other

## 2018-11-20 ENCOUNTER — Other Ambulatory Visit: Payer: Self-pay

## 2018-11-20 ENCOUNTER — Encounter (HOSPITAL_COMMUNITY): Payer: Self-pay | Admitting: Emergency Medicine

## 2018-11-20 DIAGNOSIS — Z8249 Family history of ischemic heart disease and other diseases of the circulatory system: Secondary | ICD-10-CM | POA: Diagnosis not present

## 2018-11-20 DIAGNOSIS — D62 Acute posthemorrhagic anemia: Secondary | ICD-10-CM | POA: Diagnosis not present

## 2018-11-20 DIAGNOSIS — G8929 Other chronic pain: Secondary | ICD-10-CM | POA: Diagnosis present

## 2018-11-20 DIAGNOSIS — W1839XA Other fall on same level, initial encounter: Secondary | ICD-10-CM | POA: Diagnosis present

## 2018-11-20 DIAGNOSIS — S32050S Wedge compression fracture of fifth lumbar vertebra, sequela: Secondary | ICD-10-CM

## 2018-11-20 DIAGNOSIS — R42 Dizziness and giddiness: Secondary | ICD-10-CM | POA: Diagnosis not present

## 2018-11-20 DIAGNOSIS — W19XXXA Unspecified fall, initial encounter: Secondary | ICD-10-CM

## 2018-11-20 DIAGNOSIS — Z79899 Other long term (current) drug therapy: Secondary | ICD-10-CM

## 2018-11-20 DIAGNOSIS — D649 Anemia, unspecified: Secondary | ICD-10-CM | POA: Diagnosis present

## 2018-11-20 DIAGNOSIS — Z66 Do not resuscitate: Secondary | ICD-10-CM | POA: Diagnosis present

## 2018-11-20 DIAGNOSIS — S72091A Other fracture of head and neck of right femur, initial encounter for closed fracture: Secondary | ICD-10-CM | POA: Diagnosis present

## 2018-11-20 DIAGNOSIS — Y93E9 Activity, other interior property and clothing maintenance: Secondary | ICD-10-CM | POA: Diagnosis not present

## 2018-11-20 DIAGNOSIS — S3282XA Multiple fractures of pelvis without disruption of pelvic ring, initial encounter for closed fracture: Secondary | ICD-10-CM | POA: Diagnosis present

## 2018-11-20 DIAGNOSIS — I10 Essential (primary) hypertension: Secondary | ICD-10-CM | POA: Diagnosis present

## 2018-11-20 DIAGNOSIS — S7221XA Displaced subtrochanteric fracture of right femur, initial encounter for closed fracture: Secondary | ICD-10-CM | POA: Diagnosis present

## 2018-11-20 DIAGNOSIS — S72001A Fracture of unspecified part of neck of right femur, initial encounter for closed fracture: Secondary | ICD-10-CM

## 2018-11-20 DIAGNOSIS — R52 Pain, unspecified: Secondary | ICD-10-CM | POA: Diagnosis not present

## 2018-11-20 DIAGNOSIS — Z8673 Personal history of transient ischemic attack (TIA), and cerebral infarction without residual deficits: Secondary | ICD-10-CM | POA: Diagnosis not present

## 2018-11-20 DIAGNOSIS — M25579 Pain in unspecified ankle and joints of unspecified foot: Secondary | ICD-10-CM

## 2018-11-20 DIAGNOSIS — S93401A Sprain of unspecified ligament of right ankle, initial encounter: Secondary | ICD-10-CM | POA: Diagnosis present

## 2018-11-20 DIAGNOSIS — M48062 Spinal stenosis, lumbar region with neurogenic claudication: Secondary | ICD-10-CM | POA: Diagnosis present

## 2018-11-20 DIAGNOSIS — N289 Disorder of kidney and ureter, unspecified: Secondary | ICD-10-CM | POA: Diagnosis present

## 2018-11-20 DIAGNOSIS — Z881 Allergy status to other antibiotic agents status: Secondary | ICD-10-CM

## 2018-11-20 DIAGNOSIS — M5442 Lumbago with sciatica, left side: Secondary | ICD-10-CM | POA: Diagnosis present

## 2018-11-20 DIAGNOSIS — Y92092 Bedroom in other non-institutional residence as the place of occurrence of the external cause: Secondary | ICD-10-CM

## 2018-11-20 DIAGNOSIS — Z87311 Personal history of (healed) other pathological fracture: Secondary | ICD-10-CM | POA: Diagnosis not present

## 2018-11-20 DIAGNOSIS — Z09 Encounter for follow-up examination after completed treatment for conditions other than malignant neoplasm: Secondary | ICD-10-CM

## 2018-11-20 DIAGNOSIS — S72141A Displaced intertrochanteric fracture of right femur, initial encounter for closed fracture: Secondary | ICD-10-CM | POA: Diagnosis present

## 2018-11-20 LAB — BASIC METABOLIC PANEL
Anion gap: 12 (ref 5–15)
BUN: 20 mg/dL (ref 8–23)
CHLORIDE: 101 mmol/L (ref 98–111)
CO2: 25 mmol/L (ref 22–32)
Calcium: 8.3 mg/dL — ABNORMAL LOW (ref 8.9–10.3)
Creatinine, Ser: 0.98 mg/dL (ref 0.44–1.00)
GFR calc Af Amer: 55 mL/min — ABNORMAL LOW (ref >60)
GFR calc non Af Amer: 47 mL/min — ABNORMAL LOW (ref >60)
GLUCOSE: 138 mg/dL — AB (ref 70–99)
POTASSIUM: 4 mmol/L (ref 3.5–5.1)
Sodium: 138 mmol/L (ref 135–145)

## 2018-11-20 LAB — CBC WITH DIFFERENTIAL/PLATELET
ABS IMMATURE GRANULOCYTES: 0.03 10*3/uL (ref 0.00–0.07)
BASOS ABS: 0 10*3/uL (ref 0.0–0.1)
Basophils Relative: 0 %
Eosinophils Absolute: 0.1 10*3/uL (ref 0.0–0.5)
Eosinophils Relative: 1 %
HEMATOCRIT: 31.2 % — AB (ref 36.0–46.0)
Hemoglobin: 8.8 g/dL — ABNORMAL LOW (ref 12.0–15.0)
IMMATURE GRANULOCYTES: 0 %
LYMPHS ABS: 1.2 10*3/uL (ref 0.7–4.0)
LYMPHS PCT: 15 %
MCH: 23.3 pg — ABNORMAL LOW (ref 26.0–34.0)
MCHC: 28.2 g/dL — ABNORMAL LOW (ref 30.0–36.0)
MCV: 82.5 fL (ref 80.0–100.0)
MONOS PCT: 8 %
Monocytes Absolute: 0.7 10*3/uL (ref 0.1–1.0)
NEUTROS ABS: 6.3 10*3/uL (ref 1.7–7.7)
NEUTROS PCT: 76 %
NRBC: 0 % (ref 0.0–0.2)
Platelets: 261 10*3/uL (ref 150–400)
RBC: 3.78 MIL/uL — ABNORMAL LOW (ref 3.87–5.11)
RDW: 19.7 % — ABNORMAL HIGH (ref 11.5–15.5)
WBC: 8.3 10*3/uL (ref 4.0–10.5)

## 2018-11-20 LAB — ABO/RH: ABO/RH(D): A POS

## 2018-11-20 LAB — PROTIME-INR
INR: 1.1
Prothrombin Time: 14.1 seconds (ref 11.4–15.2)

## 2018-11-20 MED ORDER — HYDROCODONE-ACETAMINOPHEN 5-325 MG PO TABS
1.0000 | ORAL_TABLET | Freq: Four times a day (QID) | ORAL | Status: DC | PRN
  Administered 2018-11-21: 2 via ORAL
  Administered 2018-11-25: 1 via ORAL
  Filled 2018-11-20: qty 1
  Filled 2018-11-20: qty 2

## 2018-11-20 MED ORDER — SODIUM CHLORIDE 0.9 % IV SOLN
INTRAVENOUS | Status: AC
  Administered 2018-11-20: 22:00:00 via INTRAVENOUS

## 2018-11-20 MED ORDER — FENTANYL CITRATE (PF) 100 MCG/2ML IJ SOLN
50.0000 ug | Freq: Once | INTRAMUSCULAR | Status: AC
  Administered 2018-11-20: 50 ug via INTRAVENOUS
  Filled 2018-11-20: qty 2

## 2018-11-20 MED ORDER — METOCLOPRAMIDE HCL 5 MG/ML IJ SOLN
10.0000 mg | Freq: Once | INTRAMUSCULAR | Status: AC
  Administered 2018-11-20: 10 mg via INTRAVENOUS
  Filled 2018-11-20: qty 2

## 2018-11-20 MED ORDER — FENTANYL CITRATE (PF) 100 MCG/2ML IJ SOLN: 100.0000 ug | Freq: Once | INTRAMUSCULAR | Status: DC

## 2018-11-20 MED ORDER — TRAMADOL HCL 50 MG PO TABS: 50.0000 mg | ORAL_TABLET | Freq: Four times a day (QID) | ORAL | Status: DC | PRN

## 2018-11-20 MED ORDER — METOPROLOL SUCCINATE ER 25 MG PO TB24
25.0000 mg | ORAL_TABLET | Freq: Every day | ORAL | Status: DC
  Administered 2018-11-20 – 2018-11-25 (×5): 25 mg via ORAL
  Filled 2018-11-20 (×6): qty 1

## 2018-11-20 MED ORDER — GABAPENTIN 100 MG PO CAPS
100.0000 mg | ORAL_CAPSULE | Freq: Every day | ORAL | Status: DC
  Administered 2018-11-20 – 2018-11-24 (×5): 100 mg via ORAL
  Filled 2018-11-20 (×5): qty 1

## 2018-11-20 MED ORDER — TRAMADOL HCL 50 MG PO TABS: 100.0000 mg | ORAL_TABLET | Freq: Four times a day (QID) | ORAL | Status: DC | PRN

## 2018-11-20 MED ORDER — POVIDONE-IODINE 10 % EX SWAB: 2.0000 "application " | Freq: Once | CUTANEOUS | Status: DC

## 2018-11-20 MED ORDER — METHOCARBAMOL 1000 MG/10ML IJ SOLN
500.0000 mg | Freq: Four times a day (QID) | INTRAVENOUS | Status: DC | PRN
  Filled 2018-11-20: qty 5

## 2018-11-20 MED ORDER — ACETAMINOPHEN 325 MG PO TABS
650.0000 mg | ORAL_TABLET | Freq: Four times a day (QID) | ORAL | Status: DC
  Administered 2018-11-20 – 2018-11-25 (×18): 650 mg via ORAL
  Filled 2018-11-20 (×18): qty 2

## 2018-11-20 MED ORDER — METHOCARBAMOL 500 MG PO TABS
500.0000 mg | ORAL_TABLET | Freq: Four times a day (QID) | ORAL | Status: DC | PRN
  Administered 2018-11-21 – 2018-11-22 (×2): 500 mg via ORAL
  Filled 2018-11-20 (×2): qty 1

## 2018-11-20 MED ORDER — POLYETHYLENE GLYCOL 3350 17 G PO PACK
17.0000 g | PACK | Freq: Every day | ORAL | Status: DC | PRN
  Administered 2018-11-23: 17 g via ORAL
  Filled 2018-11-20: qty 1

## 2018-11-20 MED ORDER — CLINDAMYCIN PHOSPHATE 900 MG/50ML IV SOLN
900.0000 mg | INTRAVENOUS | Status: AC
  Administered 2018-11-21: 900 mg via INTRAVENOUS
  Filled 2018-11-20: qty 50

## 2018-11-20 MED ORDER — CHLORHEXIDINE GLUCONATE 4 % EX LIQD
60.0000 mL | Freq: Once | CUTANEOUS | Status: DC
  Filled 2018-11-20: qty 60

## 2018-11-20 MED ORDER — MORPHINE SULFATE (PF) 2 MG/ML IV SOLN
0.5000 mg | INTRAVENOUS | Status: DC | PRN
  Administered 2018-11-20 – 2018-11-21 (×2): 0.5 mg via INTRAVENOUS
  Filled 2018-11-20 (×3): qty 1

## 2018-11-20 MED ORDER — HEPARIN SODIUM (PORCINE) 5000 UNIT/ML IJ SOLN
5000.0000 [IU] | Freq: Three times a day (TID) | INTRAMUSCULAR | Status: DC
  Administered 2018-11-20: 5000 [IU] via SUBCUTANEOUS
  Filled 2018-11-20: qty 1

## 2018-11-20 NOTE — ED Notes (Signed)
External female urinary catheter placed.

## 2018-11-20 NOTE — ED Notes (Signed)
Liquids/soup & soft food: Cottage cheese, mash pot, soft fruit, tray ordered

## 2018-11-20 NOTE — H&P (Signed)
History and Physical    Vanessa Knox QIH:[id number] DOB: Jun 23, 1916 DOA: 11/20/2018  PCP: Leanna Battles, MD (Confirm with patient/family/NH records and if not entered, this has to be entered at [facility] point of entry) Patient coming from: Home at independent living facility  I have personally briefly reviewed patient's old medical records in [place]  Chief Complaint: Hip pain  HPI: Vanessa Knox is a 84 y.o. female with medical history significant of hypertension and spinal stenosis who presents the emergency department complaining of hip pain.  She was making her bed this morning when she lost her balance.  She fell onto her right side.  She also hit her head but did not have any loss of consciousness.  She had immediate right hip pain and could not get up.  Neighbor heard her fall and called 911.  Valuation in the emergency department revealed a right intertrochanteric hip fracture and orthopedic surgery has been consulted and plan an IM nail in the a.m.  Pain is localized in the right hip.  She lives in an independent living facility.   Review of Systems: As per HPI otherwise all other systems reviewed and  negative.    Past Medical History:  Diagnosis Date  . HTN (hypertension)   . Sciatica     Past Surgical History:  Procedure Laterality Date  . ROTATOR CUFF REPAIR Right     Social History   Social History Narrative  . Not on file     reports that she has never smoked. She has never used smokeless tobacco. She reports that she does not drink alcohol or use drugs.  Allergies  Allergen Reactions  . Keflex [Cephalexin] Diarrhea and Other (See Comments)    Headaches and dizziness also     Family History  Problem Relation Age of Onset  . Heart failure Father     Prior to Admission medications   Medication Sig Start Date End Date Taking? Authorizing Provider  acetaminophen (TYLENOL) 500 MG tablet Take 500 mg by mouth every 6 (six) hours as needed for mild pain.     [provider]  Cyanocobalamin (B-12 PO) Take 1 tablet by mouth daily.    [provider]  metoprolol succinate (TOPROL-XL) 25 MG 24 hr tablet Take 25 mg by mouth daily.    [provider]    Physical Exam:  Constitutional: NAD, calm, comfortable Vitals:   11/20/18 1315 11/20/18 1345 11/20/18 1400 11/20/18 1415  BP: (!) 189/78  (!) 196/84   Pulse: 75 86  83  Resp: 14 15  14   Temp:      TempSrc:      SpO2: 95% 98%  98%   Eyes: PERRL, lids and conjunctivae normal ENMT: Mucous membranes are moist. Posterior pharynx clear of any exudate or lesions.Normal dentition.  Neck: normal, supple, no masses, no thyromegaly Respiratory: clear to auscultation bilaterally, no wheezing, no crackles. Normal respiratory effort. No accessory muscle use.  Cardiovascular: Regular rate and rhythm, no murmurs / rubs / gallops. No extremity edema. 2+ pedal pulses. No carotid bruits.  Abdomen: no tenderness, no masses palpated. No hepatosplenomegaly. Bowel sounds positive.  Musculoskeletal: no clubbing / cyanosis.  Right hip is externally rotated and shortened Skin: no rashes, lesions, ulcers. No induration Neurologic: CN 2-12 grossly intact. Sensation intact, DTR normal. Strength 5/5 in all 4.  Psychiatric: Normal judgment and insight. Alert and oriented x 3. Normal mood.   Labs on Admission: I have personally reviewed following labs and imaging studies  CBC: Recent Labs  Lab 11/20/18 1113  WBC 8.3  NEUTROABS 6.3  HGB 8.8*  HCT 31.2*  MCV 82.5  PLT 160   Basic Metabolic Panel: Recent Labs  Lab 11/20/18 1113  NA 138  K 4.0  CL 101  CO2 25  GLUCOSE 138*  BUN 20  CREATININE 0.98  CALCIUM 8.3*   Coagulation Profile: Recent Labs  Lab 11/20/18 1330  INR 1.10    Radiological Exams on Admission: Dg Chest 1 View  Result Date: 11/20/2018 CLINICAL DATA:  Status post fall with right hip fracture. EXAM: CHEST  1 VIEW COMPARISON:  Scout radiograph from a right  shoulder CT which included the chest dated November 16, 2016. FINDINGS: The lungs are well-expanded. The interstitial markings are mildly prominent. The cardiac silhouette is enlarged. The pulmonary vascularity is mildly engorged with mild cephalization. There is no alveolar edema. There is no pleural effusion. There is calcification in the wall of the aortic arch. There are moderate degenerative changes of both shoulders. IMPRESSION: Low-grade CHF. Possible underlying chronic bronchitis. No acute pneumonia. Thoracic aortic atherosclerosis. Electronically Signed   By: David  Martinique M.D.   On: 11/20/2018 12:45   Dg Ankle Complete Right  Result Date: 11/20/2018 CLINICAL DATA:  Ankle pain.  Hip fracture. EXAM: RIGHT ANKLE - COMPLETE 3+ VIEW COMPARISON:  None. FINDINGS: Mild soft tissue swelling. Mild midfoot degenerative changes. No evidence of acute fracture or dislocation. IMPRESSION: Negative. Electronically Signed   By: Nelson Chimes M.D.   On: 11/20/2018 14:19   Ct Head Wo Contrast  Result Date: 11/20/2018 CLINICAL DATA:  Status post fall with back pain. EXAM: CT HEAD WITHOUT CONTRAST CT CERVICAL SPINE WITHOUT CONTRAST TECHNIQUE: Multidetector CT imaging of the head and cervical spine was performed following the standard protocol without intravenous contrast. Multiplanar CT image reconstructions of the cervical spine were also generated. COMPARISON:  Head CT 07/14/2017 FINDINGS: CT HEAD FINDINGS Brain: No evidence of acute infarction, hemorrhage, hydrocephalus, extra-axial collection or mass lesion/mass effect. Moderate brain parenchymal volume loss and deep white matter microangiopathy. Vascular: Minimal calcific atherosclerotic disease of the intra cavernous carotid arteries. Skull: Normal. Negative for fracture or focal lesion. Sinuses/Orbits: No acute finding. Other: Post bilateral cataract extraction. CT CERVICAL SPINE FINDINGS Alignment: Straightening of the cervical lordosis. Skull base and vertebrae:  No acute fracture. No primary bone lesion or focal pathologic process. Soft tissues and spinal canal: No prevertebral fluid or swelling. No visible canal hematoma. Disc levels: Multilevel osteoarthritic changes with disc space narrowing, endplate sclerosis, remodeling of vertebral bodies and marked posterior facet arthropathy. Upper chest: Negative. Other: None. IMPRESSION: No acute intracranial abnormality. Atrophy, chronic microvascular disease. No evidence of acute traumatic injury to the cervical spine. Multilevel osteoarthritic changes of the cervical spine. Osteopenia. Electronically Signed   By: Fidela Salisbury M.D.   On: 11/20/2018 13:11   Ct Cervical Spine Wo Contrast  Result Date: 11/20/2018 CLINICAL DATA:  Status post fall with back pain. EXAM: CT HEAD WITHOUT CONTRAST CT CERVICAL SPINE WITHOUT CONTRAST TECHNIQUE: Multidetector CT imaging of the head and cervical spine was performed following the standard protocol without intravenous contrast. Multiplanar CT image reconstructions of the cervical spine were also generated. COMPARISON:  Head CT 07/14/2017 FINDINGS: CT HEAD FINDINGS Brain: No evidence of acute infarction, hemorrhage, hydrocephalus, extra-axial collection or mass lesion/mass effect. Moderate brain parenchymal volume loss and deep white matter microangiopathy. Vascular: Minimal calcific atherosclerotic disease of the intra cavernous carotid arteries. Skull: Normal. Negative for fracture or focal lesion. Sinuses/Orbits:  No acute finding. Other: Post bilateral cataract extraction. CT CERVICAL SPINE FINDINGS Alignment: Straightening of the cervical lordosis. Skull base and vertebrae: No acute fracture. No primary bone lesion or focal pathologic process. Soft tissues and spinal canal: No prevertebral fluid or swelling. No visible canal hematoma. Disc levels: Multilevel osteoarthritic changes with disc space narrowing, endplate sclerosis, remodeling of vertebral bodies and marked posterior  facet arthropathy. Upper chest: Negative. Other: None. IMPRESSION: No acute intracranial abnormality. Atrophy, chronic microvascular disease. No evidence of acute traumatic injury to the cervical spine. Multilevel osteoarthritic changes of the cervical spine. Osteopenia. Electronically Signed   By: Fidela Salisbury M.D.   On: 11/20/2018 13:11   Dg Hip Unilat W Or Wo Pelvis 2-3 Views Right  Result Date: 11/20/2018 CLINICAL DATA:  Patient fell earlier today. The patient reports severe pelvic and right hip discomfort. EXAM: DG HIP (WITH OR WITHOUT PELVIS) 2-3V RIGHT COMPARISON:  Limited views of the pelvis and right hip from a lumbar spine series of December 31, 2016. FINDINGS: The bones are subjectively osteopenic. There is deformity of the inferior pubic ramus worrisome for an acute fracture. The left superior pubic ramus exhibits mild contour deformity in its midportion which could reflect an acute fracture is well. On the right the superior pubic ramus is intact while the inferior pubic ramus exhibits some cortical irregularity but no definite fracture. AP and lateral views of the right hip reveal a comminuted angulated intertrochanteric fracture which extends into the subtrochanteric region. The femoral head and neck appear intact and the femoral head remains in reasonable association with the acetabulum. IMPRESSION: Acute comminuted angulated intertrochanteric-sub trochanteric fracture of the right hip. Abnormal appearance of the superior and inferior pubic rami on the left could reflect nondisplaced fractures. There is mild cortical irregularity of the right inferior pubic ramus without definite fracture. Electronically Signed   By: David  Martinique M.D.   On: 11/20/2018 12:42    EKG: Independently reviewed.  Sinus rhythm with minimal ST depression in the inferior leads  Assessment/Plan Principal Problem:   Comminuted fracture of hip, right, closed, initial encounter [facility]) Active Problems:   Multiple  closed pelvic fractures without disruption of pelvic circle (HCC)   Compression fracture of L5 lumbar vertebra, sequela   Spinal stenosis of lumbar region with neurogenic claudication   Chronic low back pain with left-sided sciatica   HTN (hypertension)   1.  Comminuted fracture of the hip right, closed, initial encounter: Patient has been seen by orthopedics she will require an IM nail.  Surgery is planned for tomorrow.  She may proceed to surgery.  2.  Multiple closed pelvic fractures without disruption of pelvic cervical: Patient will require skilled nursing facility rehabilitation both for her hip fracture and for pain control and management of her closed pelvic fractures.  3.  Compression lecture of L5 lumbar vertebra sequelae: Noted this is a chronic problem.  4.  Spinal stenosis of lumbar region with neurogenic claudication: Patient feels that her sciatica was acting up causing her to be unsteady on her right leg which caused her to fall.  We will continue with pain management.  5.  Chronic low back pain with left-sided sciatica due to #4.  Continue management.  6.  Hypertension noted continue home medications.   Medication reconciliation pending pharmacy evaluation  DVT prophylaxis: Subcu heparin Code Status: Full code Family Communication: Patient attained capacity.  No family present at time of admission.  Offered to call her daughter-in-law.  Patient declined. Disposition Plan: Likely  skilled facility then home Consults called: Orthopedic Admission status: Inpatient   Lady Deutscher MD [facility] Pager [phone]  If 7PM-7AM, please contact night-coverage [email or web address] Password [email or web address]  11/20/2018, 3:13 PM

## 2018-11-20 NOTE — Consult Note (Signed)
Reason for Consult:Right hip fx Referring Physician: Robyn Haber  Vanessa Knox is an 84 y.o. female.  HPI: Daniel was making her bed this morning when she lost her balance and fell onto her right side. She also hit her head. She had immediate right hip pain and could not get up. A neighbor heard and called 911. Her workup showed a right intertroch hip fx and orthopedic surgery was consulted. She c/o localized pain to the right hip. She lives in an independent living facility.  Past Medical History:  Diagnosis Date  . HTN (hypertension)   . Sciatica     Past Surgical History:  Procedure Laterality Date  . ROTATOR CUFF REPAIR Right     No family history on file.  Social History:  reports that she has never smoked. She has never used smokeless tobacco. She reports that she does not drink alcohol or use drugs.  Allergies:  Allergies  Allergen Reactions  . Keflex [Cephalexin] Diarrhea and Other (See Comments)    Headaches and dizziness also     Medications: I have reviewed the patient's current medications.  Results for orders placed or performed during the hospital encounter of 11/20/18 (from the past 48 hour(s))  Basic metabolic panel     Status: Abnormal   Collection Time: 11/20/18 11:13 AM  Result Value Ref Range   Sodium 138 135 - 145 mmol/L   Potassium 4.0 3.5 - 5.1 mmol/L   Chloride 101 98 - 111 mmol/L   CO2 25 22 - 32 mmol/L   Glucose, Bld 138 (H) 70 - 99 mg/dL   BUN 20 8 - 23 mg/dL   Creatinine, Ser 0.98 0.44 - 1.00 mg/dL   Calcium 8.3 (L) 8.9 - 10.3 mg/dL   GFR calc non Af Amer 47 (L) >60 mL/min   GFR calc Af Amer 55 (L) >60 mL/min   Anion gap 12 5 - 15    Comment: Performed at [facility] [address], [place], [place] [address]  CBC with Differential     Status: Abnormal   Collection Time: 11/20/18 11:13 AM  Result Value Ref Range   WBC 8.3 4.0 - 10.5 K/uL   RBC 3.78 (L) 3.87 - 5.11 MIL/uL   Hemoglobin 8.8 (L) 12.0 - 15.0 g/dL   HCT 31.2 (L) 36.0 -  46.0 %   MCV 82.5 80.0 - 100.0 fL   MCH 23.3 (L) 26.0 - 34.0 pg   MCHC 28.2 (L) 30.0 - 36.0 g/dL   RDW 19.7 (H) 11.5 - 15.5 %   Platelets 261 150 - 400 K/uL   nRBC 0.0 0.0 - 0.2 %   Neutrophils Relative % 76 %   Neutro Abs 6.3 1.7 - 7.7 K/uL   Lymphocytes Relative 15 %   Lymphs Abs 1.2 0.7 - 4.0 K/uL   Monocytes Relative 8 %   Monocytes Absolute 0.7 0.1 - 1.0 K/uL   Eosinophils Relative 1 %   Eosinophils Absolute 0.1 0.0 - 0.5 K/uL   Basophils Relative 0 %   Basophils Absolute 0.0 0.0 - 0.1 K/uL   Immature Granulocytes 0 %   Abs Immature Granulocytes 0.03 0.00 - 0.07 K/uL    Comment: Performed at [facility], [address], [place], [place] [address]    Dg Chest 1 View  Result Date: 11/20/2018 CLINICAL DATA:  Status post fall with right hip fracture. EXAM: CHEST  1 VIEW COMPARISON:  Scout radiograph from a right shoulder CT which included the  chest dated November 16, 2016. FINDINGS: The lungs are well-expanded. The interstitial markings are mildly prominent. The cardiac silhouette is enlarged. The pulmonary vascularity is mildly engorged with mild cephalization. There is no alveolar edema. There is no pleural effusion. There is calcification in the wall of the aortic arch. There are moderate degenerative changes of both shoulders. IMPRESSION: Low-grade CHF. Possible underlying chronic bronchitis. No acute pneumonia. Thoracic aortic atherosclerosis. Electronically Signed   By: David  Martinique M.D.   On: 11/20/2018 12:45   Dg Hip Unilat W Or Wo Pelvis 2-3 Views Right  Result Date: 11/20/2018 CLINICAL DATA:  Patient fell earlier today. The patient reports severe pelvic and right hip discomfort. EXAM: DG HIP (WITH OR WITHOUT PELVIS) 2-3V RIGHT COMPARISON:  Limited views of the pelvis and right hip from a lumbar spine series of December 31, 2016. FINDINGS: The bones are subjectively osteopenic. There is deformity of the inferior pubic ramus worrisome for an acute fracture. The left  superior pubic ramus exhibits mild contour deformity in its midportion which could reflect an acute fracture is well. On the right the superior pubic ramus is intact while the inferior pubic ramus exhibits some cortical irregularity but no definite fracture. AP and lateral views of the right hip reveal a comminuted angulated intertrochanteric fracture which extends into the subtrochanteric region. The femoral head and neck appear intact and the femoral head remains in reasonable association with the acetabulum. IMPRESSION: Acute comminuted angulated intertrochanteric-sub trochanteric fracture of the right hip. Abnormal appearance of the superior and inferior pubic rami on the left could reflect nondisplaced fractures. There is mild cortical irregularity of the right inferior pubic ramus without definite fracture. Electronically Signed   By: David  Martinique M.D.   On: 11/20/2018 12:42    Review of Systems  Constitutional: Negative for weight loss.  HENT: Negative for ear discharge, ear pain, hearing loss and tinnitus.   Eyes: Negative for blurred vision, double vision, photophobia and pain.  Respiratory: Negative for cough, sputum production and shortness of breath.   Cardiovascular: Negative for chest pain.  Gastrointestinal: Negative for abdominal pain, nausea and vomiting.  Genitourinary: Negative for dysuria, flank pain, frequency and urgency.  Musculoskeletal: Positive for joint pain (Right hip). Negative for back pain, falls, myalgias and neck pain.  Neurological: Negative for dizziness, tingling, sensory change, focal weakness, loss of consciousness and headaches.  Endo/Heme/Allergies: Does not bruise/bleed easily.  Psychiatric/Behavioral: Negative for depression, memory loss and substance abuse. The patient is not nervous/anxious.    Blood pressure (!) 176/70, pulse 65, temperature 97.6 F (36.4 C), temperature source Oral, resp. rate 18, SpO2 97 %. Physical Exam  Constitutional: She appears  well-developed and well-nourished. No distress.  HENT:  Head: Normocephalic and atraumatic.  Eyes: Conjunctivae are normal. Right eye exhibits no discharge. Left eye exhibits no discharge. No scleral icterus.  Neck: Normal range of motion.  Cardiovascular: Normal rate and regular rhythm.  Respiratory: Effort normal. No respiratory distress.  Musculoskeletal:  RLE No traumatic wounds, ecchymosis, or rash  TTP hip, also lateral mal ankle  No knee or ankle effusion  Knee stable to varus/ valgus and anterior/posterior stress  Sens DPN, SPN, TN intact  Motor EHL, ext, flex, evers 5/5  DP 2+, PT 2+, No significant edema  Neurological: She is alert.  Skin: Skin is warm and dry. She is not diaphoretic.  Psychiatric: She has a normal mood and affect. Her behavior is normal.    Assessment/Plan: Fall Right hip fx -- Plan IMN tomorrow  AM with Dr. Percell Miller Multiple pubic rami fxs -- WBAT Right ankle pain -- Sprain HTN Sciatica    Lisette Abu, PA-C Orthopedic Surgery [phone] 11/20/2018, 1:01 PM

## 2018-11-20 NOTE — ED Triage Notes (Signed)
Per [facility]: Patient to ED from [facility] assisted living facility for obvious deformity to R hip - patient states she bent over trying to make her bed and her R leg gave out as she bent over and she fell. Swelling to R hip, shortening and rotation noted. Skin tear to R shin (pt family states this is not new) and patient also endorses L ankle pain (also not new). Pulses and sensation intact distal to injury, but patient unable to move R leg d/t severe pain. She reports she also hit her head, but no LOC. A&O x 4, no other obvious injuries. 18g. PIV LAC - given 4mg  Zofran and 163mcg fentanyl PTA.

## 2018-11-20 NOTE — ED Provider Notes (Signed)
[facility] EMERGENCY DEPARTMENT Provider Note   CSN: [id number] Arrival date & time: 11/20/18  1059   History   Chief Complaint Chief Complaint  Patient presents with  . Hip Pain    HPI Vanessa Knox is a 84 y.o. female who presents with a fall and R hip pain. PMH significant for TIA, HTN, chronic back pain with sciatica. She lives in [facility]. She was making her bed and her right leg gave out due to her sciatica and she fell on to her back/right side. She had an immediate onset of pain in the R hip. She also hit the back of her head on the ground which was carpet. A neighbor heard her fall and called EMS. Her leg was noted to be shortened and rotated. Her daughter-in-law is at bedside and states that the several abrasions on her legs are not new. The patient denies LOC, headache, neck pain, chest pain, SOB, abdominal pain, acute upper extremity pain or left leg pain. She is not on blood thinners. She is DNR.  HPI  Past Medical History:  Diagnosis Date  . HTN (hypertension)   . Sciatica     Patient Active Problem List   Diagnosis Date Noted  . TIA (transient ischemic attack) 07/14/2017  . HTN (hypertension) 07/14/2017  . Compression fracture of L5 lumbar vertebra, sequela 01/02/2017  . Spinal stenosis of lumbar region with neurogenic claudication 01/02/2017  . Chronic low back pain with left-sided sciatica 01/02/2017    Past Surgical History:  Procedure Laterality Date  . ROTATOR CUFF REPAIR Right      OB History   None      Home Medications    Prior to Admission medications   Medication Sig Start Date End Date Taking? Authorizing Provider  acetaminophen (TYLENOL) 500 MG tablet Take 500 mg by mouth every 6 (six) hours as needed for mild pain.    [provider]  Cyanocobalamin (B-12 PO) Take 1 tablet by mouth daily.    [provider]  metoprolol succinate (TOPROL-XL) 25 MG 24 hr tablet Take 25 mg by mouth daily.     [provider]    Family History No family history on file.  Social History Social History   Tobacco Use  . Smoking status: Never Smoker  . Smokeless tobacco: Never Used  Substance Use Topics  . Alcohol use: No  . Drug use: No     Allergies   Keflex [cephalexin]   Review of Systems Review of Systems  Constitutional: Negative for fever.  Respiratory: Negative for shortness of breath.   Cardiovascular: Negative for chest pain.  Gastrointestinal: Positive for nausea. Negative for abdominal pain and vomiting.  Musculoskeletal: Positive for arthralgias (R hip. Chronic shoulder pain).  Skin: Positive for wound.  Neurological: Negative for syncope and headaches.  Hematological: Does not bruise/bleed easily.  All other systems reviewed and are negative.    Physical Exam Updated Vital Signs BP (!) 194/80 (BP Location: Right Arm)   Pulse 75   Temp 97.6 F (36.4 C) (Oral)   Resp 16   SpO2 97%   Physical Exam  Constitutional: She is oriented to person, place, and time. She appears well-developed and well-nourished. No distress.  Calm and cooperative. NAD  HENT:  Head: Normocephalic and atraumatic.  Eyes: Pupils are equal, round, and reactive to light. Conjunctivae are normal. Right eye exhibits no discharge. Left eye exhibits no discharge. No scleral icterus.  Neck: Normal range of motion.  No  midline tenderness  Cardiovascular: Normal rate and regular rhythm.  Pulmonary/Chest: Effort normal and breath sounds normal. No respiratory distress.  Abdominal: Soft. Bowel sounds are normal. She exhibits no distension. There is no tenderness.  Musculoskeletal:  Right hip: Shortened and rotated. Skin tear over the shin which was is not new. 2+DP pulse  Neurological: She is alert and oriented to person, place, and time.  Skin: Skin is warm and dry.  Psychiatric: She has a normal mood and affect. Her behavior is normal.  Nursing note and vitals reviewed.    ED  Treatments / Results  Labs (all labs ordered are listed, but only abnormal results are displayed) Labs Reviewed  BASIC METABOLIC PANEL - Abnormal; Notable for the following components:      Result Value   Glucose, Bld 138 (*)    Calcium 8.3 (*)    GFR calc non Af Amer 47 (*)    GFR calc Af Amer 55 (*)    All other components within normal limits  CBC WITH DIFFERENTIAL/PLATELET - Abnormal; Notable for the following components:   RBC 3.78 (*)    Hemoglobin 8.8 (*)    HCT 31.2 (*)    MCH 23.3 (*)    MCHC 28.2 (*)    RDW 19.7 (*)    All other components within normal limits  PROTIME-INR  TYPE AND SCREEN    EKG None  Radiology Dg Chest 1 View  Result Date: 11/20/2018 CLINICAL DATA:  Status post fall with right hip fracture. EXAM: CHEST  1 VIEW COMPARISON:  Scout radiograph from a right shoulder CT which included the chest dated November 16, 2016. FINDINGS: The lungs are well-expanded. The interstitial markings are mildly prominent. The cardiac silhouette is enlarged. The pulmonary vascularity is mildly engorged with mild cephalization. There is no alveolar edema. There is no pleural effusion. There is calcification in the wall of the aortic arch. There are moderate degenerative changes of both shoulders. IMPRESSION: Low-grade CHF. Possible underlying chronic bronchitis. No acute pneumonia. Thoracic aortic atherosclerosis. Electronically Signed   By: David  Martinique M.D.   On: 11/20/2018 12:45   Ct Head Wo Contrast  Result Date: 11/20/2018 CLINICAL DATA:  Status post fall with back pain. EXAM: CT HEAD WITHOUT CONTRAST CT CERVICAL SPINE WITHOUT CONTRAST TECHNIQUE: Multidetector CT imaging of the head and cervical spine was performed following the standard protocol without intravenous contrast. Multiplanar CT image reconstructions of the cervical spine were also generated. COMPARISON:  Head CT 07/14/2017 FINDINGS: CT HEAD FINDINGS Brain: No evidence of acute infarction, hemorrhage,  hydrocephalus, extra-axial collection or mass lesion/mass effect. Moderate brain parenchymal volume loss and deep white matter microangiopathy. Vascular: Minimal calcific atherosclerotic disease of the intra cavernous carotid arteries. Skull: Normal. Negative for fracture or focal lesion. Sinuses/Orbits: No acute finding. Other: Post bilateral cataract extraction. CT CERVICAL SPINE FINDINGS Alignment: Straightening of the cervical lordosis. Skull base and vertebrae: No acute fracture. No primary bone lesion or focal pathologic process. Soft tissues and spinal canal: No prevertebral fluid or swelling. No visible canal hematoma. Disc levels: Multilevel osteoarthritic changes with disc space narrowing, endplate sclerosis, remodeling of vertebral bodies and marked posterior facet arthropathy. Upper chest: Negative. Other: None. IMPRESSION: No acute intracranial abnormality. Atrophy, chronic microvascular disease. No evidence of acute traumatic injury to the cervical spine. Multilevel osteoarthritic changes of the cervical spine. Osteopenia. Electronically Signed   By: Fidela Salisbury M.D.   On: 11/20/2018 13:11   Ct Cervical Spine Wo Contrast  Result Date: 11/20/2018 CLINICAL  DATA:  Status post fall with back pain. EXAM: CT HEAD WITHOUT CONTRAST CT CERVICAL SPINE WITHOUT CONTRAST TECHNIQUE: Multidetector CT imaging of the head and cervical spine was performed following the standard protocol without intravenous contrast. Multiplanar CT image reconstructions of the cervical spine were also generated. COMPARISON:  Head CT 07/14/2017 FINDINGS: CT HEAD FINDINGS Brain: No evidence of acute infarction, hemorrhage, hydrocephalus, extra-axial collection or mass lesion/mass effect. Moderate brain parenchymal volume loss and deep white matter microangiopathy. Vascular: Minimal calcific atherosclerotic disease of the intra cavernous carotid arteries. Skull: Normal. Negative for fracture or focal lesion. Sinuses/Orbits: No  acute finding. Other: Post bilateral cataract extraction. CT CERVICAL SPINE FINDINGS Alignment: Straightening of the cervical lordosis. Skull base and vertebrae: No acute fracture. No primary bone lesion or focal pathologic process. Soft tissues and spinal canal: No prevertebral fluid or swelling. No visible canal hematoma. Disc levels: Multilevel osteoarthritic changes with disc space narrowing, endplate sclerosis, remodeling of vertebral bodies and marked posterior facet arthropathy. Upper chest: Negative. Other: None. IMPRESSION: No acute intracranial abnormality. Atrophy, chronic microvascular disease. No evidence of acute traumatic injury to the cervical spine. Multilevel osteoarthritic changes of the cervical spine. Osteopenia. Electronically Signed   By: Fidela Salisbury M.D.   On: 11/20/2018 13:11   Dg Hip Unilat W Or Wo Pelvis 2-3 Views Right  Result Date: 11/20/2018 CLINICAL DATA:  Patient fell earlier today. The patient reports severe pelvic and right hip discomfort. EXAM: DG HIP (WITH OR WITHOUT PELVIS) 2-3V RIGHT COMPARISON:  Limited views of the pelvis and right hip from a lumbar spine series of December 31, 2016. FINDINGS: The bones are subjectively osteopenic. There is deformity of the inferior pubic ramus worrisome for an acute fracture. The left superior pubic ramus exhibits mild contour deformity in its midportion which could reflect an acute fracture is well. On the right the superior pubic ramus is intact while the inferior pubic ramus exhibits some cortical irregularity but no definite fracture. AP and lateral views of the right hip reveal a comminuted angulated intertrochanteric fracture which extends into the subtrochanteric region. The femoral head and neck appear intact and the femoral head remains in reasonable association with the acetabulum. IMPRESSION: Acute comminuted angulated intertrochanteric-sub trochanteric fracture of the right hip. Abnormal appearance of the superior and  inferior pubic rami on the left could reflect nondisplaced fractures. There is mild cortical irregularity of the right inferior pubic ramus without definite fracture. Electronically Signed   By: David  Martinique M.D.   On: 11/20/2018 12:42    Procedures Procedures (including critical care time)  Medications Ordered in ED Medications  metoCLOPramide (REGLAN) injection 10 mg (10 mg Intravenous Given 11/20/18 1148)     Initial Impression / Assessment and Plan / ED Course  I have reviewed the triage vital signs and the nursing notes.  Pertinent labs & imaging results that were available during my care of the patient were reviewed by me and considered in my medical decision making (see chart for details).  84 year old female presents with a fall and R hip injury. Hip is shortened and rotated on exam. Distal pulses are intact. She is markedly hypertensive but otherwise vitals are normal. She also hit her head but has no complaints regarding this. Will obtain Xray of hip, CXR, CT head and neck, and labs, EKG. She is still nauseous. Reglan ordered. Pain is controlled.  CBC is remarkable for anemia which is changed from baseline. It is 8.8 today. Last value was 13 in July 2018.  Her daughter in law states that she has known anemia but cannot tolerate iron. BMP is overall normal. Xray shows acute, comminuted, angulated intertrochanteric and subtrochanteric fracture as well as pelvis fx. CXR shows chronic changes. EKG is SR. CT head and C-spine are normal. Orthopedics will see. Will consult hospitalist for admission.  Discussed with Dr. Evangeline Gula who will admit.  Final Clinical Impressions(s) / ED Diagnoses   Final diagnoses:  Closed fracture of right hip, initial encounter [facility])  Multiple closed fractures of pelvis without disruption of pelvic ring, initial encounter [facility])  Fall, initial encounter  Anemia, unspecified type    ED Discharge Orders    None       Recardo Evangelist, PA-C 11/20/18  Rolla, Nathan, MD 11/20/18 1517

## 2018-11-21 ENCOUNTER — Encounter (HOSPITAL_COMMUNITY)
Admission: EM | Disposition: A | Discharge status: Skilled Nursing Facility | Payer: Self-pay | Source: Home / Self Care | Attending: Family Medicine

## 2018-11-21 ENCOUNTER — Inpatient Hospital Stay (HOSPITAL_COMMUNITY): Payer: Medicare Other

## 2018-11-21 ENCOUNTER — Other Ambulatory Visit: Payer: Self-pay

## 2018-11-21 ENCOUNTER — Inpatient Hospital Stay (HOSPITAL_COMMUNITY): Payer: Medicare Other | Admitting: Anesthesiology

## 2018-11-21 ENCOUNTER — Encounter (HOSPITAL_COMMUNITY): Payer: Self-pay | Admitting: Surgery

## 2018-11-21 HISTORY — PX: FEMUR IM NAIL: SHX1597

## 2018-11-21 LAB — BASIC METABOLIC PANEL
Anion gap: 11 (ref 5–15)
BUN: 22 mg/dL (ref 8–23)
CO2: 23 mmol/L (ref 22–32)
Calcium: 8 mg/dL — ABNORMAL LOW (ref 8.9–10.3)
Chloride: 101 mmol/L (ref 98–111)
Creatinine, Ser: 1.26 mg/dL — ABNORMAL HIGH (ref 0.44–1.00)
GFR calc Af Amer: 40 mL/min — ABNORMAL LOW (ref >60)
GFR, EST NON AFRICAN AMERICAN: 35 mL/min — AB (ref >60)
Glucose, Bld: 115 mg/dL — ABNORMAL HIGH (ref 70–99)
Potassium: 4.6 mmol/L (ref 3.5–5.1)
Sodium: 135 mmol/L (ref 135–145)

## 2018-11-21 LAB — CBC
HEMATOCRIT: 29.8 % — AB (ref 36.0–46.0)
Hemoglobin: 8.6 g/dL — ABNORMAL LOW (ref 12.0–15.0)
MCH: 23.2 pg — ABNORMAL LOW (ref 26.0–34.0)
MCHC: 28.9 g/dL — ABNORMAL LOW (ref 30.0–36.0)
MCV: 80.3 fL (ref 80.0–100.0)
Platelets: 250 10*3/uL (ref 150–400)
RBC: 3.71 MIL/uL — ABNORMAL LOW (ref 3.87–5.11)
RDW: 19.7 % — ABNORMAL HIGH (ref 11.5–15.5)
WBC: 6.5 10*3/uL (ref 4.0–10.5)
nRBC: 0 % (ref 0.0–0.2)

## 2018-11-21 LAB — MRSA PCR SCREENING: MRSA by PCR: NEGATIVE

## 2018-11-21 SURGERY — INSERTION, INTRAMEDULLARY ROD, FEMUR
Anesthesia: General | Site: Hip | Laterality: Right

## 2018-11-21 MED ORDER — ENSURE SURGERY PO LIQD: 237.0000 mL | ORAL | Status: DC

## 2018-11-21 MED ORDER — CEFAZOLIN SODIUM-DEXTROSE 2-3 GM-%(50ML) IV SOLR
INTRAVENOUS | Status: DC | PRN
  Administered 2018-11-21: 2 g via INTRAVENOUS

## 2018-11-21 MED ORDER — DEXAMETHASONE SODIUM PHOSPHATE 10 MG/ML IJ SOLN
INTRAMUSCULAR | Status: DC | PRN
  Administered 2018-11-21: 10 mg via INTRAVENOUS

## 2018-11-21 MED ORDER — ONDANSETRON HCL 4 MG/2ML IJ SOLN
INTRAMUSCULAR | Status: AC
  Filled 2018-11-21: qty 2

## 2018-11-21 MED ORDER — ENOXAPARIN SODIUM 30 MG/0.3ML ~~LOC~~ SOLN
30.0000 mg | SUBCUTANEOUS | Status: DC
  Administered 2018-11-22: 30 mg via SUBCUTANEOUS
  Filled 2018-11-21 (×2): qty 0.3

## 2018-11-21 MED ORDER — HYDROCODONE-ACETAMINOPHEN 5-325 MG PO TABS: 1.0000 | ORAL_TABLET | ORAL | 0 refills | Status: DC | PRN

## 2018-11-21 MED ORDER — ONDANSETRON HCL 4 MG/2ML IJ SOLN: 4.0000 mg | Freq: Once | INTRAMUSCULAR | Status: DC | PRN

## 2018-11-21 MED ORDER — ONDANSETRON HCL 4 MG PO TABS: 4.0000 mg | ORAL_TABLET | Freq: Four times a day (QID) | ORAL | Status: DC | PRN

## 2018-11-21 MED ORDER — FENTANYL CITRATE (PF) 250 MCG/5ML IJ SOLN
INTRAMUSCULAR | Status: AC
  Filled 2018-11-21: qty 5

## 2018-11-21 MED ORDER — ACETAMINOPHEN 10 MG/ML IV SOLN
INTRAVENOUS | Status: DC | PRN
  Administered 2018-11-21: 1000 mg via INTRAVENOUS

## 2018-11-21 MED ORDER — LACTATED RINGERS IV SOLN
INTRAVENOUS | Status: DC
  Administered 2018-11-21: 11:00:00 via INTRAVENOUS

## 2018-11-21 MED ORDER — FENTANYL CITRATE (PF) 100 MCG/2ML IJ SOLN: 25.0000 ug | INTRAMUSCULAR | Status: DC | PRN

## 2018-11-21 MED ORDER — LIDOCAINE 2% (20 MG/ML) 5 ML SYRINGE
INTRAMUSCULAR | Status: DC | PRN
  Administered 2018-11-21: 60 mg via INTRAVENOUS

## 2018-11-21 MED ORDER — DEXAMETHASONE SODIUM PHOSPHATE 10 MG/ML IJ SOLN
INTRAMUSCULAR | Status: AC
  Filled 2018-11-21: qty 1

## 2018-11-21 MED ORDER — PHENYLEPHRINE 40 MCG/ML (10ML) SYRINGE FOR IV PUSH (FOR BLOOD PRESSURE SUPPORT)
PREFILLED_SYRINGE | INTRAVENOUS | Status: AC
  Filled 2018-11-21: qty 10

## 2018-11-21 MED ORDER — FENTANYL CITRATE (PF) 250 MCG/5ML IJ SOLN
INTRAMUSCULAR | Status: DC | PRN
  Administered 2018-11-21: 50 ug via INTRAVENOUS
  Administered 2018-11-21: 25 ug via INTRAVENOUS
  Administered 2018-11-21: 50 ug via INTRAVENOUS

## 2018-11-21 MED ORDER — PROPOFOL 10 MG/ML IV BOLUS
INTRAVENOUS | Status: AC
  Filled 2018-11-21: qty 20

## 2018-11-21 MED ORDER — PROPOFOL 10 MG/ML IV BOLUS
INTRAVENOUS | Status: DC | PRN
  Administered 2018-11-21: 90 mg via INTRAVENOUS

## 2018-11-21 MED ORDER — ENSURE SURGERY PO LIQD
237.0000 mL | Freq: Every day | ORAL | Status: DC
  Administered 2018-11-21 – 2018-11-24 (×3): 237 mL via ORAL
  Filled 2018-11-21 (×5): qty 237

## 2018-11-21 MED ORDER — SODIUM CHLORIDE 0.9 % IR SOLN
Status: DC | PRN
  Administered 2018-11-21: 1000 mL

## 2018-11-21 MED ORDER — EPHEDRINE SULFATE 50 MG/ML IJ SOLN
INTRAMUSCULAR | Status: DC | PRN
  Administered 2018-11-21 (×2): 10 mg via INTRAVENOUS

## 2018-11-21 MED ORDER — PHENYLEPHRINE HCL 10 MG/ML IJ SOLN
INTRAMUSCULAR | Status: DC | PRN
  Administered 2018-11-21 (×2): 80 ug via INTRAVENOUS

## 2018-11-21 MED ORDER — ONDANSETRON HCL 4 MG/2ML IJ SOLN
4.0000 mg | Freq: Four times a day (QID) | INTRAMUSCULAR | Status: DC | PRN
  Filled 2018-11-21: qty 2

## 2018-11-21 MED ORDER — ENOXAPARIN SODIUM 30 MG/0.3ML ~~LOC~~ SOLN: 30.0000 mg | SUBCUTANEOUS | 0 refills | Status: DC

## 2018-11-21 MED ORDER — ACETAMINOPHEN 10 MG/ML IV SOLN
INTRAVENOUS | Status: AC
  Filled 2018-11-21: qty 100

## 2018-11-21 MED ORDER — HYDRALAZINE HCL 20 MG/ML IJ SOLN: 10.0000 mg | Freq: Four times a day (QID) | INTRAMUSCULAR | Status: DC | PRN

## 2018-11-21 MED ORDER — LACTATED RINGERS IV SOLN
INTRAVENOUS | Status: DC
  Administered 2018-11-21 (×2): via INTRAVENOUS

## 2018-11-21 MED ORDER — LIDOCAINE 2% (20 MG/ML) 5 ML SYRINGE
INTRAMUSCULAR | Status: AC
  Filled 2018-11-21: qty 5

## 2018-11-21 MED ORDER — ONDANSETRON HCL 4 MG/2ML IJ SOLN
INTRAMUSCULAR | Status: DC | PRN
  Administered 2018-11-21: 4 mg via INTRAVENOUS

## 2018-11-21 SURGICAL SUPPLY — 42 items
BIT DRILL AO GAMMA 4.2X180 (BIT) ×3 IMPLANT
CLOSURE STERI-STRIP 1/2X4 (GAUZE/BANDAGES/DRESSINGS) ×1
CLOSURE WOUND 1/2 X4 (GAUZE/BANDAGES/DRESSINGS) ×1
CLSR STERI-STRIP ANTIMIC 1/2X4 (GAUZE/BANDAGES/DRESSINGS) ×2 IMPLANT
COVER MAYO STAND STRL (DRAPES) ×3 IMPLANT
COVER PERINEAL POST (MISCELLANEOUS) ×3 IMPLANT
COVER SURGICAL LIGHT HANDLE (MISCELLANEOUS) ×3 IMPLANT
COVER WAND RF STERILE (DRAPES) ×3 IMPLANT
DRAPE STERI IOBAN 125X83 (DRAPES) ×3 IMPLANT
DRSG MEPILEX BORDER 4X4 (GAUZE/BANDAGES/DRESSINGS) ×9 IMPLANT
DURAPREP 26ML APPLICATOR (WOUND CARE) ×3 IMPLANT
ELECT REM PT RETURN 9FT ADLT (ELECTROSURGICAL) ×3
ELECTRODE REM PT RTRN 9FT ADLT (ELECTROSURGICAL) ×1 IMPLANT
GLOVE BIO SURGEON STRL SZ7.5 (GLOVE) ×6 IMPLANT
GLOVE BIOGEL PI IND STRL 8 (GLOVE) ×2 IMPLANT
GLOVE BIOGEL PI INDICATOR 8 (GLOVE) ×4
GOWN STRL REUS W/ TWL LRG LVL3 (GOWN DISPOSABLE) ×2 IMPLANT
GOWN STRL REUS W/TWL LRG LVL3 (GOWN DISPOSABLE) ×4
GUIDEROD T2 3X1000 (ROD) ×3 IMPLANT
K-WIRE  3.2X450M STR (WIRE) ×2
K-WIRE 3.2X450M STR (WIRE) ×1
KIT BASIN OR (CUSTOM PROCEDURE TRAY) ×3 IMPLANT
KIT TURNOVER KIT B (KITS) ×3 IMPLANT
KWIRE 3.2X450M STR (WIRE) ×1 IMPLANT
MANIFOLD NEPTUNE II (INSTRUMENTS) ×3 IMPLANT
NAIL GAMMA LG R 5TI 10X360X125 (Nail) ×3 IMPLANT
NS IRRIG 1000ML POUR BTL (IV SOLUTION) ×3 IMPLANT
PACK GENERAL/GYN (CUSTOM PROCEDURE TRAY) ×3 IMPLANT
PAD ARMBOARD 7.5X6 YLW CONV (MISCELLANEOUS) ×6 IMPLANT
SCREW LAG GAMMA 3 TI 10.5X100M (Screw) ×3 IMPLANT
SCREW LOCKING T2 F/T  5X42.5MM (Screw) ×2 IMPLANT
SCREW LOCKING T2 F/T 5X42.5MM (Screw) ×1 IMPLANT
STRIP CLOSURE SKIN 1/2X4 (GAUZE/BANDAGES/DRESSINGS) ×2 IMPLANT
SUT ETHILON 3 0 PS 1 (SUTURE) ×6 IMPLANT
SUT MNCRL AB 4-0 PS2 18 (SUTURE) ×3 IMPLANT
SUT VIC AB 0 CT1 27 (SUTURE) ×4
SUT VIC AB 0 CT1 27XBRD ANBCTR (SUTURE) ×2 IMPLANT
SUT VIC AB 2-0 CT1 27 (SUTURE) ×2
SUT VIC AB 2-0 CT1 TAPERPNT 27 (SUTURE) ×1 IMPLANT
TOWEL OR 17X26 10 PK STRL BLUE (TOWEL DISPOSABLE) ×3 IMPLANT
TOWEL OR NON WOVEN STRL DISP B (DISPOSABLE) ×3 IMPLANT
WATER STERILE IRR 1000ML POUR (IV SOLUTION) ×3 IMPLANT

## 2018-11-21 NOTE — Progress Notes (Signed)
Unable to complete admission documentation;pt has been asleep  most of the time and is hard of hearing;also no family member around here in the unit to ask for some questions with regards to admission.

## 2018-11-21 NOTE — Transfer of Care (Signed)
Immediate Anesthesia Transfer of Care Note  Patient: Vanessa Knox  Procedure(s) Performed: INTRAMEDULLARY (IM) NAIL FEMORAL (Right Hip)  Patient Location: PACU  Anesthesia Type:General  Level of Consciousness: awake  Airway & Oxygen Therapy: Patient Spontanous Breathing and Patient connected to nasal cannula oxygen  Post-op Assessment: Report given to RN and Post -op Vital signs reviewed and stable  Post vital signs: Reviewed and stable  Last Vitals:  Vitals Value Taken Time  BP    Temp    Pulse 72 11/21/2018  1:09 PM  Resp 20 11/21/2018  1:09 PM  SpO2 98 % 11/21/2018  1:09 PM  Vitals shown include unvalidated device data.  Last Pain:  Vitals:   11/21/18 0900  TempSrc:   PainSc: 0-No pain         Complications: No apparent anesthesia complications

## 2018-11-21 NOTE — Anesthesia Postprocedure Evaluation (Signed)
Anesthesia Post Note  Patient: Vanessa Knox  Procedure(s) Performed: INTRAMEDULLARY (IM) NAIL FEMORAL (Right Hip)     Patient location during evaluation: PACU Anesthesia Type: General Level of consciousness: awake and alert Pain management: pain level controlled Vital Signs Assessment: post-procedure vital signs reviewed and stable Respiratory status: spontaneous breathing, nonlabored ventilation and respiratory function stable Cardiovascular status: blood pressure returned to baseline and stable Postop Assessment: no apparent nausea or vomiting Anesthetic complications: no    Last Vitals:  Vitals:   11/21/18 1316 11/21/18 1325  BP: 136/61 135/64  Pulse: 71 72  Resp: 15 16  Temp:  (!) 36.3 C  SpO2: 97% 99%    Last Pain:  Vitals:   11/21/18 0900  TempSrc:   PainSc: 0-No pain                 Audry Pili

## 2018-11-21 NOTE — Progress Notes (Addendum)
Patient Demographics:    Vanessa Knox, is a 84 y.o. female, DOB - Apr 10, 1916, XHB:[id number]  Admit date - 11/20/2018   Admitting Physician Lady Deutscher, MD  Outpatient Primary MD for the patient is Leanna Battles, MD  LOS - 1   Chief Complaint  Patient presents with  . Hip Pain        Subjective:    Vanessa Knox today has no fevers, no emesis,  No chest pain, c/o right hip pain,   Assessment  & Plan :    Principal Problem:   Comminuted fracture of hip, right, closed, initial encounter [facility]) Active Problems:   Compression fracture of L5 lumbar vertebra, sequela   Spinal stenosis of lumbar region with neurogenic claudication   Chronic low back pain with left-sided sciatica   HTN (hypertension)   Multiple closed pelvic fractures without disruption of pelvic circle (HCC)   Closed right hip fracture, initial encounter [facility])  Brief summary 84 y.o. female with medical history significant of hypertension and spinal stenosis admitted on 11/20/2018 with right hip pain and found to have a right hip fracture after mechanical fall at home.  Plan:- 1)Rt Hip Fx - s/p IM Nail procedure on 11/21/2018, as per orthopedic team weightbearing as tolerated  2)Closed pelvic fractures--- conservative management advised  3)HTN-stable, continue Toprol-XL 25 mg daily,   may use IV Hydralazine 10 mg  Every 4 hours Prn for systolic blood pressure over 160 mmhg  4) history of chronic compression fracture of L5 as well as chronic back pain with left-sided sciatica and spinal stenosis of the lumbar lesion with neurogenic claudication--- continue physical therapy and pain management  5) acute blood loss anemia--- due to hip fracture and subsequent surgery, hemoglobin currently above 8 monitor closely and transfuse as clinically indicated  6)Social/Ethics--patient lived in independent living apartments prior to  admission, she is a DNR/DNI  Disposition/Need for in-Hospital Stay- patient unable to be discharged at this time due to patient needs insurance approval to go to SNF rehab after hip surgery*  Code Status : DNR   Disposition Plan  : SNF  Consults  :  ortho   DVT Prophylaxis  : Heparin   Lab Results  Component Value Date   PLT 250 11/21/2018    Inpatient Medications  Scheduled Meds: . [MAR Hold] acetaminophen  650 mg Oral Q6H  . [MAR Hold] chlorhexidine  60 mL Topical Once  . [MAR Hold] gabapentin  100 mg Oral QHS  . [MAR Hold] heparin  5,000 Units Subcutaneous Q8H  . [MAR Hold] metoprolol succinate  25 mg Oral Daily  . [MAR Hold] povidone-iodine  2 application Topical Once   Continuous Infusions: . sodium chloride 50 mL/hr at 11/20/18 2159  . lactated ringers 10 mL/hr at 11/21/18 1033  . [MAR Hold] methocarbamol (ROBAXIN) IV     PRN Meds:.[MAR Hold] HYDROcodone-acetaminophen, [MAR Hold] methocarbamol **OR** [MAR Hold] methocarbamol (ROBAXIN) IV, [MAR Hold]  morphine injection, [MAR Hold] polyethylene glycol, sodium chloride irrigation, [MAR Hold] traMADol, [MAR Hold] traMADol    Anti-infectives (From admission, onward)   Start     Dose/Rate Route Frequency Ordered Stop   11/20/18 1730  [MAR Hold]  clindamycin (CLEOCIN) IVPB 900 mg     (MAR Hold since  Sat 11/21/2018 at 1016. Reason: Transfer to a Procedural area.)   900 mg 100 mL/hr over 30 Minutes Intravenous To [facility] 11/20/18 1711 11/21/18 1129        Objective:   Vitals:   11/20/18 2030 11/21/18 0035 11/21/18 0509 11/21/18 0900  BP: (!) 151/60 (!) 122/58 (!) 147/66   Pulse: 69 73 66   Resp: 15     Temp:   97.9 F (36.6 C)   TempSrc:   Oral   SpO2: 95% 97% 95%   Weight:    66.9 kg  Height:    5\' 5"  (1.651 m)    Wt Readings from Last 3 Encounters:  11/21/18 66.9 kg     Intake/Output Summary (Last 24 hours) at 11/21/2018 1303 Last data filed at 11/21/2018 1252 Gross per 24 hour  Intake 500  ml  Output 850 ml  Net -350 ml     Physical Exam Patient is examined daily including today on 11/21/18 , exams remain the same as of yesterday except that has changed   Gen:- Awake Alert,  In no apparent distress  HEENT:- [place].AT, No sclera icterus Neck-Supple Neck,No JVD,.  Lungs-  CTAB , fair symmetrical air movement CV- S1, S2 normal, regular  Abd-  +ve B.Sounds, Abd Soft, No tenderness,    Extremity/Skin:- No  edema, pedal pulses present  Psych-affect is appropriate, oriented x3 Neuro-no new focal deficits, no tremors MSK--right hip area with appropriate postop tenderness, postop wound is intact  Data Review:   Micro Results Recent Results (from the past 240 hour(s))  MRSA PCR Screening     Status: None   Collection Time: 11/21/18 12:06 AM  Result Value Ref Range Status   MRSA by PCR NEGATIVE NEGATIVE Final    Comment:        The GeneXpert MRSA Assay (FDA approved for NASAL specimens only), is one component of a comprehensive MRSA colonization surveillance program. It is not intended to diagnose MRSA infection nor to guide or monitor treatment for MRSA infections. Performed at [facility], [place] [address], [place], [place] [address]     Radiology Reports Dg Chest 1 View  Result Date: 11/20/2018 CLINICAL DATA:  Status post fall with right hip fracture. EXAM: CHEST  1 VIEW COMPARISON:  Scout radiograph from a right shoulder CT which included the chest dated November 16, 2016. FINDINGS: The lungs are well-expanded. The interstitial markings are mildly prominent. The cardiac silhouette is enlarged. The pulmonary vascularity is mildly engorged with mild cephalization. There is no alveolar edema. There is no pleural effusion. There is calcification in the wall of the aortic arch. There are moderate degenerative changes of both shoulders. IMPRESSION: Low-grade CHF. Possible underlying chronic bronchitis. No acute pneumonia. Thoracic aortic atherosclerosis. Electronically  Signed   By: David  Martinique M.D.   On: 11/20/2018 12:45   Dg Ankle Complete Right  Result Date: 11/20/2018 CLINICAL DATA:  Ankle pain.  Hip fracture. EXAM: RIGHT ANKLE - COMPLETE 3+ VIEW COMPARISON:  None. FINDINGS: Mild soft tissue swelling. Mild midfoot degenerative changes. No evidence of acute fracture or dislocation. IMPRESSION: Negative. Electronically Signed   By: Nelson Chimes M.D.   On: 11/20/2018 14:19   Ct Head Wo Contrast  Result Date: 11/20/2018 CLINICAL DATA:  Status post fall with back pain. EXAM: CT HEAD WITHOUT CONTRAST CT CERVICAL SPINE WITHOUT CONTRAST TECHNIQUE: Multidetector CT imaging of the head and cervical spine was performed following the standard protocol without intravenous contrast. Multiplanar CT image reconstructions  of the cervical spine were also generated. COMPARISON:  Head CT 07/14/2017 FINDINGS: CT HEAD FINDINGS Brain: No evidence of acute infarction, hemorrhage, hydrocephalus, extra-axial collection or mass lesion/mass effect. Moderate brain parenchymal volume loss and deep white matter microangiopathy. Vascular: Minimal calcific atherosclerotic disease of the intra cavernous carotid arteries. Skull: Normal. Negative for fracture or focal lesion. Sinuses/Orbits: No acute finding. Other: Post bilateral cataract extraction. CT CERVICAL SPINE FINDINGS Alignment: Straightening of the cervical lordosis. Skull base and vertebrae: No acute fracture. No primary bone lesion or focal pathologic process. Soft tissues and spinal canal: No prevertebral fluid or swelling. No visible canal hematoma. Disc levels: Multilevel osteoarthritic changes with disc space narrowing, endplate sclerosis, remodeling of vertebral bodies and marked posterior facet arthropathy. Upper chest: Negative. Other: None. IMPRESSION: No acute intracranial abnormality. Atrophy, chronic microvascular disease. No evidence of acute traumatic injury to the cervical spine. Multilevel osteoarthritic changes of the  cervical spine. Osteopenia. Electronically Signed   By: Fidela Salisbury M.D.   On: 11/20/2018 13:11   Ct Cervical Spine Wo Contrast  Result Date: 11/20/2018 CLINICAL DATA:  Status post fall with back pain. EXAM: CT HEAD WITHOUT CONTRAST CT CERVICAL SPINE WITHOUT CONTRAST TECHNIQUE: Multidetector CT imaging of the head and cervical spine was performed following the standard protocol without intravenous contrast. Multiplanar CT image reconstructions of the cervical spine were also generated. COMPARISON:  Head CT 07/14/2017 FINDINGS: CT HEAD FINDINGS Brain: No evidence of acute infarction, hemorrhage, hydrocephalus, extra-axial collection or mass lesion/mass effect. Moderate brain parenchymal volume loss and deep white matter microangiopathy. Vascular: Minimal calcific atherosclerotic disease of the intra cavernous carotid arteries. Skull: Normal. Negative for fracture or focal lesion. Sinuses/Orbits: No acute finding. Other: Post bilateral cataract extraction. CT CERVICAL SPINE FINDINGS Alignment: Straightening of the cervical lordosis. Skull base and vertebrae: No acute fracture. No primary bone lesion or focal pathologic process. Soft tissues and spinal canal: No prevertebral fluid or swelling. No visible canal hematoma. Disc levels: Multilevel osteoarthritic changes with disc space narrowing, endplate sclerosis, remodeling of vertebral bodies and marked posterior facet arthropathy. Upper chest: Negative. Other: None. IMPRESSION: No acute intracranial abnormality. Atrophy, chronic microvascular disease. No evidence of acute traumatic injury to the cervical spine. Multilevel osteoarthritic changes of the cervical spine. Osteopenia. Electronically Signed   By: Fidela Salisbury M.D.   On: 11/20/2018 13:11   Dg Hip Unilat W Or Wo Pelvis 2-3 Views Right  Result Date: 11/20/2018 CLINICAL DATA:  Patient fell earlier today. The patient reports severe pelvic and right hip discomfort. EXAM: DG HIP (WITH OR  WITHOUT PELVIS) 2-3V RIGHT COMPARISON:  Limited views of the pelvis and right hip from a lumbar spine series of December 31, 2016. FINDINGS: The bones are subjectively osteopenic. There is deformity of the inferior pubic ramus worrisome for an acute fracture. The left superior pubic ramus exhibits mild contour deformity in its midportion which could reflect an acute fracture is well. On the right the superior pubic ramus is intact while the inferior pubic ramus exhibits some cortical irregularity but no definite fracture. AP and lateral views of the right hip reveal a comminuted angulated intertrochanteric fracture which extends into the subtrochanteric region. The femoral head and neck appear intact and the femoral head remains in reasonable association with the acetabulum. IMPRESSION: Acute comminuted angulated intertrochanteric-sub trochanteric fracture of the right hip. Abnormal appearance of the superior and inferior pubic rami on the left could reflect nondisplaced fractures. There is mild cortical irregularity of the right inferior pubic ramus without definite  fracture. Electronically Signed   By: David  Martinique M.D.   On: 11/20/2018 12:42     CBC Recent Labs  Lab 11/20/18 1113 11/21/18 0526  WBC 8.3 6.5  HGB 8.8* 8.6*  HCT 31.2* 29.8*  PLT 261 250  MCV 82.5 80.3  MCH 23.3* 23.2*  MCHC 28.2* 28.9*  RDW 19.7* 19.7*  LYMPHSABS 1.2  --   MONOABS 0.7  --   EOSABS 0.1  --   BASOSABS 0.0  --     Chemistries  Recent Labs  Lab 11/20/18 1113 11/21/18 0526  NA 138 135  K 4.0 4.6  CL 101 101  CO2 25 23  GLUCOSE 138* 115*  BUN 20 22  CREATININE 0.98 1.26*  CALCIUM 8.3* 8.0*     Lab Results  Component Value Date   HGBA1C 6.0 (H) 07/15/2017   ---------------------------------------------------------------------------------- Coagulation profile Recent Labs  Lab 11/20/18 1330  INR 1.10    Roxan Hockey M.D on 11/21/2018 at 1:03 PM  Pager---[phone] Go to [email or web address] -  password [email or web address] for contact info  [facility] - Office  [phone]

## 2018-11-21 NOTE — Progress Notes (Signed)
Spoke with pt and daughter-in -Sports coach. Pt came in with [facility] DNR form from [facility]. Pt was supposed to be DNR. Unable to locate form in ED, and did not see it scanned into pts med record. MD signed new DNR prior to surgery. Family and pt made aware

## 2018-11-21 NOTE — Anesthesia Preprocedure Evaluation (Addendum)
Anesthesia Evaluation  Patient identified by MRN, date of birth, ID band Patient awake    Reviewed: Allergy & Precautions, NPO status , Patient's Chart, lab work & pertinent test results  History of Anesthesia Complications Negative for: history of anesthetic complications  Airway Mallampati: II  TM Distance: >3 FB Neck ROM: Full    Dental  (+) Dental Advisory Given, Partial Upper   Pulmonary neg pulmonary ROS,    breath sounds clear to auscultation       Cardiovascular hypertension, Pt. on home beta blockers and Pt. on medications + Valvular Problems/Murmurs MR  Rhythm:Regular Rate:Normal   '18 Carotid US - b/l 40-59% ICAS  '18 TTE - EF 60% to 65%. Grade 1 diastolic dysfunction. Mild to moderate MR. Mildly dilated LA.    Neuro/Psych  Spinal stenosis  TIA Neuromuscular disease (Sciatica) negative psych ROS   GI/Hepatic negative GI ROS, Neg liver ROS,   Endo/Other  negative endocrine ROS  Renal/GU Renal InsufficiencyRenal disease     Musculoskeletal negative musculoskeletal ROS (+)   Abdominal   Peds  Hematology  (+) anemia ,   Anesthesia Other Findings   Reproductive/Obstetrics                            Anesthesia Physical Anesthesia Plan  ASA: II  Anesthesia Plan: General   Post-op Pain Management:    Induction: Intravenous  PONV Risk Score and Plan: 3 and Treatment may vary due to age or medical condition, Ondansetron and TIVA  Airway Management Planned: LMA  Additional Equipment: None  Intra-op Plan:   Post-operative Plan: Extubation in OR  Informed Consent: I have reviewed the patients History and Physical, chart, labs and discussed the procedure including the risks, benefits and alternatives for the proposed anesthesia with the patient or authorized representative who has indicated his/her understanding and acceptance.   Dental advisory given  Plan Discussed  with: CRNA and Anesthesiologist  Anesthesia Plan Comments: ( Discussion had regarding spinal vs. GA, patient opted for GA. Also had discussion regarding DNR status. Patient agreeable to invasive airway management, IV fluids, vasopressors, and CPR as indicated for/during the procedure.)      Anesthesia Quick Evaluation

## 2018-11-21 NOTE — Anesthesia Procedure Notes (Signed)
Procedure Name: LMA Insertion Date/Time: 11/21/2018 11:28 AM Performed by: Clearnce Sorrel, CRNA Pre-anesthesia Checklist: Patient identified, Emergency Drugs available, Suction available, Patient being monitored and Timeout performed Patient Re-evaluated:Patient Re-evaluated prior to induction Oxygen Delivery Method: Circle system utilized Preoxygenation: Pre-oxygenation with 100% oxygen Induction Type: IV induction LMA: LMA inserted and LMA with gastric port inserted LMA Size: 4.0 Number of attempts: 1 Placement Confirmation: positive ETCO2 and breath sounds checked- equal and bilateral Tube secured with: Tape Dental Injury: Teeth and Oropharynx as per pre-operative assessment

## 2018-11-21 NOTE — Progress Notes (Signed)
Nutrition Brief Note  Nutrition screen performed as part of hip fx protocol  Wt Readings from Last 15 Encounters:  11/21/18 66.9 kg   Body mass index is 24.54 kg/m. Patient meets criteria for healthy wt for ht based on current BMI.   Spoke with patient regarding nutrition habits PTA. She is a remarkable historian for her age and appears fully cognitively intact. She comes from an independent living facility-[facility]-where she has lived for 2 years. She says she receives 3 meals a day there and eats well. She does not follow any therapeutic diets, though she says she has always been nutritionally conscious. Her favorite meal is a soup and salad. When she doesn't eat a meal, she will drink an Ensure.   Wt wise, she says her UBW is 145 lbs +/- a pound or so. Todays bed weight is 150 lbs. She was admitted at 147.5 lbs.   She has mod-severe muscle/fat wasting, but at her age this is fully expected. She reports no changes in functional status or appearance PTA. When asked if she feels she has lost muscle when she looks at herself in the mirror, she says "No, I only feel as though I look like I should be haunting a house".  Pt agreed to preemptive supplementation with Ensure surgery. No further nutrition needs identified.  If nutrition issues arise, please consult RD.   Burtis Junes RD, LDN, CNSC Clinical Nutrition Available Tues-Sat via Pager: [phone] 11/21/2018 6:54 PM

## 2018-11-21 NOTE — Op Note (Signed)
DATE OF SURGERY:  11/21/2018  TIME: 12:36 PM  PATIENT NAME:  Vanessa Knox  AGE: 84 y.o.  PRE-OPERATIVE DIAGNOSIS:  Right hip fracture  POST-OPERATIVE DIAGNOSIS:  SAME  PROCEDURE:  INTRAMEDULLARY (IM) NAIL FEMORAL  SURGEON:  Renette Butters  ASSISTANT:  Roxan Hockey, PA-C, he was present and scrubbed throughout the case, critical for completion in a timely fashion, and for retraction, instrumentation, and closure.   OPERATIVE IMPLANTS: Stryker Gamma Nail  PREOPERATIVE INDICATIONS:  [name] is a 84 y.o. year old who fell and suffered a hip fracture. She was brought into the ER and then admitted and optimized and then elected for surgical intervention.    The risks benefits and alternatives were discussed with the patient including but not limited to the risks of nonoperative treatment, versus surgical intervention including infection, bleeding, nerve injury, malunion, nonunion, hardware prominence, hardware failure, need for hardware removal, blood clots, cardiopulmonary complications, morbidity, mortality, among others, and they were willing to proceed.    OPERATIVE PROCEDURE:  The patient was brought to the operating room and placed in the supine position. General anesthesia was administered. She was placed on the fracture table.  Closed reduction was performed under C-arm guidance. Time out was then performed after sterile prep and drape. She received preoperative antibiotics.  Incision was made proximal to the greater trochanter. A guidewire was placed in the appropriate position. Confirmation was made on AP and lateral views. The above-named nail was opened. I opened the proximal femur with a reamer. I then placed the nail by hand easily down. I did not need to ream the femur.  Once the nail was completely seated, I placed a guidepin into the femoral head into the center center position. I measured the length, and then reamed the lateral cortex and up into the head. I then  placed the lag screw. Slight compression was applied. Anatomic fixation achieved. Bone quality was mediocre.  I then secured the proximal interlocking bolt, and took off a half a turn, and then removed the instruments, and took final C-arm pictures AP and lateral the entire length of the leg.  I then used perfect circles technique to place a distal interlock screw.   Anatomic reconstruction was achieved, and the wounds were irrigated copiously and closed with Vicryl followed by staples and sterile gauze for the skin. The patient was awakened and returned to PACU in stable and satisfactory condition. There no complications and the patient tolerated the procedure well.  She will be weightbearing as tolerated, and will be on chemical px  for a period of four weeks after discharge.   Edmonia Lynch, M.D.

## 2018-11-22 LAB — BASIC METABOLIC PANEL
Anion gap: 9 (ref 5–15)
BUN: 30 mg/dL — ABNORMAL HIGH (ref 8–23)
CO2: 27 mmol/L (ref 22–32)
Calcium: 8 mg/dL — ABNORMAL LOW (ref 8.9–10.3)
Chloride: 101 mmol/L (ref 98–111)
Creatinine, Ser: 1.26 mg/dL — ABNORMAL HIGH (ref 0.44–1.00)
GFR calc Af Amer: 40 mL/min — ABNORMAL LOW (ref >60)
GFR calc non Af Amer: 35 mL/min — ABNORMAL LOW (ref >60)
Glucose, Bld: 129 mg/dL — ABNORMAL HIGH (ref 70–99)
Potassium: 4.4 mmol/L (ref 3.5–5.1)
Sodium: 137 mmol/L (ref 135–145)

## 2018-11-22 LAB — CBC
HCT: 26.3 % — ABNORMAL LOW (ref 36.0–46.0)
Hemoglobin: 7.7 g/dL — ABNORMAL LOW (ref 12.0–15.0)
MCH: 23.7 pg — ABNORMAL LOW (ref 26.0–34.0)
MCHC: 29.3 g/dL — ABNORMAL LOW (ref 30.0–36.0)
MCV: 80.9 fL (ref 80.0–100.0)
Platelets: 218 10*3/uL (ref 150–400)
RBC: 3.25 MIL/uL — ABNORMAL LOW (ref 3.87–5.11)
RDW: 19.6 % — ABNORMAL HIGH (ref 11.5–15.5)
WBC: 7.2 10*3/uL (ref 4.0–10.5)
nRBC: 0 % (ref 0.0–0.2)

## 2018-11-22 NOTE — Progress Notes (Signed)
    Subjective: Patient reports pain as mild.  Feeling well.  Tolerating diet.   No CP, SOB.  Not yet OOB.  Objective:   VITALS:   Vitals:   11/21/18 2040 11/22/18 0026 11/22/18 0512 11/22/18 0826  BP: (!) 141/64 113/60 (!) 129/59 102/70  Pulse: 80 81 75 97  Resp:    16  Temp: 97.6 F (36.4 C) 98.2 F (36.8 C) (!) 97.5 F (36.4 C)   TempSrc: Oral Oral Oral   SpO2: 99% 100% 100% 99%  Weight:      Height:       CBC Latest Ref Rng & Units 11/22/2018 11/21/2018 11/20/2018  WBC 4.0 - 10.5 K/uL 7.2 6.5 8.3  Hemoglobin 12.0 - 15.0 g/dL 7.7(L) 8.6(L) 8.8(L)  Hematocrit 36.0 - 46.0 % 26.3(L) 29.8(L) 31.2(L)  Platelets 150 - 400 K/uL 218 250 261   BMP Latest Ref Rng & Units 11/22/2018 11/21/2018 11/20/2018  Glucose 70 - 99 mg/dL 129(H) 115(H) 138(H)  BUN 8 - 23 mg/dL 30(H) 22 20  Creatinine 0.44 - 1.00 mg/dL 1.26(H) 1.26(H) 0.98  Sodium 135 - 145 mmol/L 137 135 138  Potassium 3.5 - 5.1 mmol/L 4.4 4.6 4.0  Chloride 98 - 111 mmol/L 101 101 101  CO2 22 - 32 mmol/L 27 23 25   Calcium 8.9 - 10.3 mg/dL 8.0(L) 8.0(L) 8.3(L)   Intake/Output      12/07 0701 - 12/08 0700 12/08 0701 - 12/09 0700   P.O. 60    I.V. (mL/kg) 1442.4 (21.6)    Total Intake(mL/kg) 1502.4 (22.5)    Urine (mL/kg/hr) 750 (0.5)    Blood 50    Total Output 800    Net +702.4            Physical Exam: General: NAD.  Upright in bed on arrival.  Calm, pleasant, conversant. MSK RLE: Neurovascularly intact Sensation intact distally Feet warm Dorsiflexion/Plantar flexion intact Incision: dressing C/D/I   Assessment: 1 Day Post-Op  S/P Procedure(s) (LRB): INTRAMEDULLARY (IM) NAIL FEMORAL (Right) by Dr. Ernesta Amble. 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  Pain controlled Not yet mobilized with therapy Desires discharge back to [facility]: Up with therapy Incentive Spirometry Apply ice as needed  Weightbearing: WBAT RLE Insicional and dressing care: Dressings left intact until follow-up Showering: Keep dressing dry VTE prophylaxis: Lovenox, SCDs, ambulation Pain control: Tylenol, minimize narcotics. Follow - up plan: 2 weeks Contact information:  Edmonia Lynch MD, Roxan Hockey PA-C  Dispo: TBD.  Therapy evaluations pending.  Continue recommendations per medicine.  Patient desires discharge back to her previous assisted living facility -she has good support there.   Charna Elizabeth Martensen III, PA-C 11/22/2018, 11:49 AM

## 2018-11-22 NOTE — Evaluation (Signed)
Physical Therapy Evaluation Patient Details Name: Vanessa Knox MRN: [id number] DOB: 04/27/1916 Today's Date: 11/22/2018   History of Present Illness  Patient is a 84 y/o female presenting to the ED on 11/20/18 due to R hip pain following a fall at home. Xray revealing right intertrochanteric hip fracture. R IM nail on 11/21/18. Past medical history significant for hypertension and spinal stenosis.     Clinical Impression  [name] is a very pleasant 84 y/o female admitted with the above listed diagnosis. Patient reports that prior to admission she was Mod I with mobility and ADLs. Patient today requiring Max A +2 for bed level mobility and transfers with gait deferred due to pain limiting. Patient with poor posturing throughout exam with excessive forward flexion even with cueing. Patient also with poor weight bearing onto R LE with patient tendency to slide LE across floor during pivot transfer. PT to recommend SNF at discharge due to current functional status. PT to continue to follow.     Follow Up Recommendations SNF;Supervision/Assistance - 24 hour    Equipment Recommendations  Other (comment)(defer)    Recommendations for Other Services OT consult(as ordered)     Precautions / Restrictions Precautions Precautions: Fall Restrictions Weight Bearing Restrictions: Yes RLE Weight Bearing: Weight bearing as tolerated      Mobility  Bed Mobility Overal bed mobility: Needs Assistance Bed Mobility: Supine to Sit     Supine to sit: Max assist;+2 for physical assistance     General bed mobility comments: requires Max +2 to bring R LE towards EOB and then for trunk righting; use of bed pad to come fully to EOB  Transfers Overall transfer level: Needs assistance Equipment used: Rolling walker (2 wheeled) Transfers: Sit to/from [facility] Sit to Stand: Max assist;+2 physical assistance;From elevated surface Stand pivot transfers: Max assist;+2 physical  assistance       General transfer comment: Max A + 2 to power up from bedside; excessive forward trunk flexion with poor hand hold on RW requiring consistent verbal cueing; max cueing to complete stand pivot transfer with assist needed at trunk and at RW; requires MAx A for positioning in recliner  Ambulation/Gait             General Gait Details: deferred  Stairs            Wheelchair Mobility    Modified Rankin (Stroke Patients Only)       Balance Overall balance assessment: Needs assistance Sitting-balance support: Bilateral upper extremity supported;Feet supported Sitting balance-Leahy Scale: Fair     Standing balance support: Bilateral upper extremity supported;During functional activity Standing balance-Leahy Scale: Poor Standing balance comment: reliant on RW and external support                             Pertinent Vitals/Pain Pain Assessment: Faces Faces Pain Scale: Hurts whole lot Pain Location: R hip(with mobility) Pain Descriptors / Indicators: Aching;Discomfort;Grimacing;Guarding;Moaning Pain Intervention(s): Limited activity within patient's tolerance;Monitored during session;Repositioned;Patient requesting pain meds-RN notified    Home Living Family/patient expects to be discharged to:: Assisted living(independent living)               Home Equipment: Cane - single point;Electric scooter;Walker - 4 wheels;Transport chair;Grab bars - tub/shower;Grab bars - toilet;Shower seat      Prior Function Level of Independence: Independent with assistive device(s)         Comments: using rollator for mobility within home and short  distances; Transport planner for going to meals/longer distances; daughter-in-law drives for grocercies      Hand Dominance        Extremity/Trunk Assessment   Upper Extremity Assessment Upper Extremity Assessment: Defer to OT evaluation    Lower Extremity Assessment Lower Extremity Assessment:  Generalized weakness;RLE deficits/detail RLE Deficits / Details: unable to lift R LE against gravity - requires physical assist for all mobility of this LE RLE: Unable to fully assess due to pain RLE Sensation: WNL RLE Coordination: decreased gross motor;decreased fine motor    Cervical / Trunk Assessment Cervical / Trunk Assessment: Normal  Communication   Communication: HOH  Cognition Arousal/Alertness: Awake/alert Behavior During Therapy: WFL for tasks assessed/performed Overall Cognitive Status: No family/caregiver present to determine baseline cognitive functioning                                 General Comments: Able to answer all question appropriately. Patient reporting confusion of time of day - states "I can't believe I slept that long"      General Comments General comments (skin integrity, edema, etc.): patient fearful during transfer; requires consistent cueing for safety    Exercises     Assessment/Plan    PT Assessment Patient needs continued PT services  PT Problem List Decreased strength;Decreased range of motion;Decreased activity tolerance;Decreased balance;Decreased mobility;Decreased safety awareness       PT Treatment Interventions DME instruction;Gait training;Functional mobility training;Therapeutic activities;Therapeutic exercise;Balance training;Patient/family education    PT Goals (Current goals can be found in the Care Plan section)  Acute Rehab PT Goals Patient Stated Goal: regain mobility PT Goal Formulation: With patient Time For Goal Achievement: 12/06/18 Potential to Achieve Goals: Fair    Frequency Min 3X/week   Barriers to discharge        Co-evaluation PT/OT/SLP Co-Evaluation/Treatment: Yes Reason for Co-Treatment: Complexity of the patient's impairments (multi-system involvement);For patient/therapist safety;To address functional/ADL transfers PT goals addressed during session: Mobility/safety with  mobility;Balance;Proper use of DME         AM-PAC PT "6 Clicks" Mobility  Outcome Measure Help needed turning from your back to your side while in a flat bed without using bedrails?: A Lot Help needed moving from lying on your back to sitting on the side of a flat bed without using bedrails?: Total Help needed moving to and from a bed to a chair (including a wheelchair)?: Total Help needed standing up from a chair using your arms (e.g., wheelchair or bedside chair)?: Total Help needed to walk in hospital room?: Total Help needed climbing 3-5 steps with a railing? : Total 6 Click Score: 7    End of Session Equipment Utilized During Treatment: Gait belt Activity Tolerance: Patient limited by fatigue;Patient limited by pain Patient left: in chair;with call bell/phone within reach;with chair alarm set Nurse Communication: Mobility status PT Visit Diagnosis: Unsteadiness on feet (R26.81);Other abnormalities of gait and mobility (R26.89);Muscle weakness (generalized) (M62.81);History of falling (Z91.81)    Time: 6599-3570 PT Time Calculation (min) (ACUTE ONLY): 38 min   Charges:   PT Evaluation $PT Eval Moderate Complexity: 1 Mod         Lanney Gins, PT, DPT Supplemental Physical Therapist 11/22/18 12:35 PM Pager: [phone] Office: [phone]

## 2018-11-22 NOTE — Evaluation (Signed)
Clinical/Bedside Swallow Evaluation Patient Details  Name: Vanessa Knox MRN: [id number] Date of Birth: 12-01-1916  Today's Date: 11/22/2018 Time: SLP Start Time (ACUTE ONLY): 0835 SLP Stop Time (ACUTE ONLY): 0900 SLP Time Calculation (min) (ACUTE ONLY): 25 min  Past Medical History:  Past Medical History:  Diagnosis Date  . HTN (hypertension)   . Sciatica    Past Surgical History:  Past Surgical History:  Procedure Laterality Date  . ROTATOR CUFF REPAIR Right    HPI:  Vanessa Knox 84 y.o. female with hypertension, spinal stenosis andTIA 07/15/17 with all symptoms resolving. She present to the ED with right hip pain after a fall the morning of 11/20/18 while making her bed. Radiography revealed a right intertrochanteric hip fracture requiring orthopedic surgery on 12/7. She resides in an independent living facility. No known swallowing problems.   Assessment / Plan / Recommendation Clinical Impression  Patients swallow abilities are felt to be within normal limits. Patient seen while finishing her breakfast tray. Observed patient with regular diet, thin liquids (ice, thin liquid, puree, solids). Patient coughed one time with sequencial swallows of water. Her cough was immediate and strong. No wet vocal quality or change in respiration after cough. No further coughing or clearing throughout meal. Educated patient about aspiration precautions, sitting upright for meals whenever possible. Recommended patient take single sips of liquids. No further speech therapy recommended at this time. Please reconsult if you have any further concerns.  SLP Visit Diagnosis: Dysphagia, unspecified (R13.10)    Aspiration Risk  No limitations    Diet Recommendation Regular;Thin liquid   Liquid Administration via: Cup Medication Administration: Whole meds with liquid Supervision: Patient able to self feed Compensations: Small sips/bites Postural Changes: Seated upright at 90 degrees    Other   Recommendations Oral Care Recommendations: Oral care QID   Follow up Recommendations None        Swallow Study   General Date of Onset: 11/20/18 HPI: Vanessa Knox 84 y.o. female with hypertension, spinal stenosis andTIA 07/15/17 with all symptoms resolving. She present to the ED with right hip pain after a fall the morning of 11/20/18 while making her bed. Radiography revealed a right intertrochanteric hip fracture requiring orthopedic surgery on 12/7. She resides in an independent living facility. No known swallowing problems. Type of Study: Bedside Swallow Evaluation Previous Swallow Assessment: none Diet Prior to this Study: Regular;Thin liquids Temperature Spikes Noted: No Respiratory Status: Room air History of Recent Intubation: No Behavior/Cognition: Alert;Cooperative;Pleasant mood Oral Cavity Assessment: Within Functional Limits Oral Care Completed by SLP: No Oral Cavity - Dentition: Dentures, top;Dentures, bottom Vision: Functional for self-feeding Self-Feeding Abilities: Able to feed self Patient Positioning: Upright in bed Baseline Vocal Quality: Normal Volitional Cough: Strong Volitional Swallow: Able to elicit    Oral/Motor/Sensory Function Overall Oral Motor/Sensory Function: Within functional limits   Ice Chips Ice chips: Within functional limits Presentation: Spoon   Thin Liquid Thin Liquid: Within functional limits Presentation: Cup    Nectar Thick Nectar Thick Liquid: Not tested   Honey Thick Honey Thick Liquid: Not tested   Puree Puree: Within functional limits Presentation: Spoon   Solid     Solid: Within functional limits Presentation: Denning, MA, CCC-SLP 11/22/2018 9:06 AM

## 2018-11-22 NOTE — Progress Notes (Signed)
Patient Demographics:    Vanessa Knox, is a 84 y.o. female, DOB - 01-Jun-1916, QJJ:[id number]  Admit date - 11/20/2018   Admitting Physician Lady Deutscher, MD  Outpatient Primary MD for the patient is Leanna Battles, MD  LOS - 2   Chief Complaint  Patient presents with  . Hip Pain        Subjective:    [name] today has no fevers, no emesis,  No chest pain, c/o right hip pain,   Assessment  & Plan :    Principal Problem:   Comminuted fracture of hip, right, closed, initial encounter [facility]) Active Problems:   Compression fracture of L5 lumbar vertebra, sequela   Spinal stenosis of lumbar region with neurogenic claudication   Chronic low back pain with left-sided sciatica   HTN (hypertension)   Multiple closed pelvic fractures without disruption of pelvic circle (HCC)   Closed right hip fracture, initial encounter [facility])  Brief summary 84 y.o. female with medical history significant of hypertension and spinal stenosis admitted on 11/20/2018 with right hip pain and found to have a right hip fracture after mechanical fall at home.  Plan:- 1)Rt Hip Fx - s/p IM Nail procedure on 11/21/2018, as per orthopedic team weightbearing as tolerated  2)Closed pelvic fractures--- conservative management advised  3)HTN-stable, continue Toprol-XL 25 mg daily,   may use IV Hydralazine 10 mg  Every 4 hours Prn for systolic blood pressure over 160 mmhg  4) history of chronic compression fracture of L5 as well as chronic back pain with left-sided sciatica and spinal stenosis of the lumbar lesion with neurogenic claudication--- continue physical therapy and pain management  5)Acute blood loss anemia--- due to hip fracture and subsequent surgery, hemoglobin down to 7.7 from 8.6, continue to monitor closely and transfuse as clinically indicated  6)Social/Ethics--patient lived in independent living apartments  prior to admission, she is a DNR/DNI  Disposition/Need for in-Hospital Stay- patient unable to be discharged at this time due to patient needs insurance approval to go to SNF rehab after hip surgery*  Code Status : DNR   Disposition Plan  : SNF  Consults  :  ortho   DVT Prophylaxis  : Heparin   Lab Results  Component Value Date   PLT 218 11/22/2018    Inpatient Medications  Scheduled Meds: . acetaminophen  650 mg Oral Q6H  . chlorhexidine  60 mL Topical Once  . enoxaparin (LOVENOX) injection  30 mg Subcutaneous Q24H  . feeding supplement  237 mL Oral Q1200  . gabapentin  100 mg Oral QHS  . metoprolol succinate  25 mg Oral Daily  . povidone-iodine  2 application Topical Once   Continuous Infusions: . lactated ringers 10 mL/hr at 11/21/18 1033  . lactated ringers 50 mL/hr at 11/21/18 2034  . methocarbamol (ROBAXIN) IV     PRN Meds:.hydrALAZINE, HYDROcodone-acetaminophen, methocarbamol **OR** methocarbamol (ROBAXIN) IV, morphine injection, ondansetron **OR** ondansetron (ZOFRAN) IV, polyethylene glycol, traMADol, traMADol    Anti-infectives (From admission, onward)   Start     Dose/Rate Route Frequency Ordered Stop   11/20/18 1730  clindamycin (CLEOCIN) IVPB 900 mg     900 mg 100 mL/hr over 30 Minutes Intravenous To [facility] 11/20/18 1711 11/21/18 1129  Objective:   Vitals:   11/22/18 0026 11/22/18 0512 11/22/18 0826 11/22/18 1516  BP: 113/60 (!) 129/59 102/70 (!) 125/48  Pulse: 81 75 97 87  Resp:   16 16  Temp: 98.2 F (36.8 C) (!) 97.5 F (36.4 C)  (!) 96.5 F (35.8 C)  TempSrc: Oral Oral  Rectal  SpO2: 100% 100% 99% 96%  Weight:      Height:        Wt Readings from Last 3 Encounters:  11/21/18 66.9 kg     Intake/Output Summary (Last 24 hours) at 11/22/2018 1549 Last data filed at 11/21/2018 1651 Gross per 24 hour  Intake 802.44 ml  Output -  Net 802.44 ml     Physical Exam Patient is examined daily including today on 11/22/18  , exams remain the same as of yesterday except that has changed   Gen:- Awake Alert,  In no apparent distress  HEENT:- [place].AT, No sclera icterus Ears- HOH Neck-Supple Neck,No JVD,.  Lungs-  CTAB , fair symmetrical air movement CV- S1, S2 normal, regular  Abd-  +ve B.Sounds, Abd Soft, No tenderness,    Extremity/Skin:- No  edema, pedal pulses present  Psych-affect is appropriate, oriented x3 Neuro-no new focal deficits, no tremors MSK--right hip area with appropriate postop tenderness, postop wound is intact  Data Review:   Micro Results Recent Results (from the past 240 hour(s))  MRSA PCR Screening     Status: None   Collection Time: 11/21/18 12:06 AM  Result Value Ref Range Status   MRSA by PCR NEGATIVE NEGATIVE Final    Comment:        The GeneXpert MRSA Assay (FDA approved for NASAL specimens only), is one component of a comprehensive MRSA colonization surveillance program. It is not intended to diagnose MRSA infection nor to guide or monitor treatment for MRSA infections. Performed at [facility], [place] [address] [place], [place] [address]     Radiology Reports Dg Chest 1 View  Result Date: 11/20/2018 CLINICAL DATA:  Status post fall with right hip fracture. EXAM: CHEST  1 VIEW COMPARISON:  Scout radiograph from a right shoulder CT which included the chest dated November 16, 2016. FINDINGS: The lungs are well-expanded. The interstitial markings are mildly prominent. The cardiac silhouette is enlarged. The pulmonary vascularity is mildly engorged with mild cephalization. There is no alveolar edema. There is no pleural effusion. There is calcification in the wall of the aortic arch. There are moderate degenerative changes of both shoulders. IMPRESSION: Low-grade CHF. Possible underlying chronic bronchitis. No acute pneumonia. Thoracic aortic atherosclerosis. Electronically Signed   By: David  Martinique M.D.   On: 11/20/2018 12:45   Dg Ankle Complete Right  Result Date:  11/20/2018 CLINICAL DATA:  Ankle pain.  Hip fracture. EXAM: RIGHT ANKLE - COMPLETE 3+ VIEW COMPARISON:  None. FINDINGS: Mild soft tissue swelling. Mild midfoot degenerative changes. No evidence of acute fracture or dislocation. IMPRESSION: Negative. Electronically Signed   By: Nelson Chimes M.D.   On: 11/20/2018 14:19   Ct Head Wo Contrast  Result Date: 11/20/2018 CLINICAL DATA:  Status post fall with back pain. EXAM: CT HEAD WITHOUT CONTRAST CT CERVICAL SPINE WITHOUT CONTRAST TECHNIQUE: Multidetector CT imaging of the head and cervical spine was performed following the standard protocol without intravenous contrast. Multiplanar CT image reconstructions of the cervical spine were also generated. COMPARISON:  Head CT 07/14/2017 FINDINGS: CT HEAD FINDINGS Brain: No evidence of acute infarction, hemorrhage, hydrocephalus, extra-axial collection or mass lesion/mass effect.  Moderate brain parenchymal volume loss and deep white matter microangiopathy. Vascular: Minimal calcific atherosclerotic disease of the intra cavernous carotid arteries. Skull: Normal. Negative for fracture or focal lesion. Sinuses/Orbits: No acute finding. Other: Post bilateral cataract extraction. CT CERVICAL SPINE FINDINGS Alignment: Straightening of the cervical lordosis. Skull base and vertebrae: No acute fracture. No primary bone lesion or focal pathologic process. Soft tissues and spinal canal: No prevertebral fluid or swelling. No visible canal hematoma. Disc levels: Multilevel osteoarthritic changes with disc space narrowing, endplate sclerosis, remodeling of vertebral bodies and marked posterior facet arthropathy. Upper chest: Negative. Other: None. IMPRESSION: No acute intracranial abnormality. Atrophy, chronic microvascular disease. No evidence of acute traumatic injury to the cervical spine. Multilevel osteoarthritic changes of the cervical spine. Osteopenia. Electronically Signed   By: Fidela Salisbury M.D.   On: 11/20/2018 13:11     Ct Cervical Spine Wo Contrast  Result Date: 11/20/2018 CLINICAL DATA:  Status post fall with back pain. EXAM: CT HEAD WITHOUT CONTRAST CT CERVICAL SPINE WITHOUT CONTRAST TECHNIQUE: Multidetector CT imaging of the head and cervical spine was performed following the standard protocol without intravenous contrast. Multiplanar CT image reconstructions of the cervical spine were also generated. COMPARISON:  Head CT 07/14/2017 FINDINGS: CT HEAD FINDINGS Brain: No evidence of acute infarction, hemorrhage, hydrocephalus, extra-axial collection or mass lesion/mass effect. Moderate brain parenchymal volume loss and deep white matter microangiopathy. Vascular: Minimal calcific atherosclerotic disease of the intra cavernous carotid arteries. Skull: Normal. Negative for fracture or focal lesion. Sinuses/Orbits: No acute finding. Other: Post bilateral cataract extraction. CT CERVICAL SPINE FINDINGS Alignment: Straightening of the cervical lordosis. Skull base and vertebrae: No acute fracture. No primary bone lesion or focal pathologic process. Soft tissues and spinal canal: No prevertebral fluid or swelling. No visible canal hematoma. Disc levels: Multilevel osteoarthritic changes with disc space narrowing, endplate sclerosis, remodeling of vertebral bodies and marked posterior facet arthropathy. Upper chest: Negative. Other: None. IMPRESSION: No acute intracranial abnormality. Atrophy, chronic microvascular disease. No evidence of acute traumatic injury to the cervical spine. Multilevel osteoarthritic changes of the cervical spine. Osteopenia. Electronically Signed   By: Fidela Salisbury M.D.   On: 11/20/2018 13:11   Dg C-arm 1-60 Min  Result Date: 11/21/2018 CLINICAL DATA:  Intramedullary nail EXAM: DG C-ARM 61-120 MIN; RIGHT FEMUR 2 VIEWS COMPARISON:  11/20/2018 FINDINGS: Multiple C-arm images show gamma nail placement for fixation of an intertrochanteric fracture. Components appear well positioned. Distal locking  screw in place. Good position and alignment. IMPRESSION: Good appearance following ORIF for treatment of right intertrochanteric fracture. Electronically Signed   By: Nelson Chimes M.D.   On: 11/21/2018 14:08   Dg Hip Unilat W Or Wo Pelvis 2-3 Views Right  Result Date: 11/20/2018 CLINICAL DATA:  Patient fell earlier today. The patient reports severe pelvic and right hip discomfort. EXAM: DG HIP (WITH OR WITHOUT PELVIS) 2-3V RIGHT COMPARISON:  Limited views of the pelvis and right hip from a lumbar spine series of December 31, 2016. FINDINGS: The bones are subjectively osteopenic. There is deformity of the inferior pubic ramus worrisome for an acute fracture. The left superior pubic ramus exhibits mild contour deformity in its midportion which could reflect an acute fracture is well. On the right the superior pubic ramus is intact while the inferior pubic ramus exhibits some cortical irregularity but no definite fracture. AP and lateral views of the right hip reveal a comminuted angulated intertrochanteric fracture which extends into the subtrochanteric region. The femoral head and neck appear intact and  the femoral head remains in reasonable association with the acetabulum. IMPRESSION: Acute comminuted angulated intertrochanteric-sub trochanteric fracture of the right hip. Abnormal appearance of the superior and inferior pubic rami on the left could reflect nondisplaced fractures. There is mild cortical irregularity of the right inferior pubic ramus without definite fracture. Electronically Signed   By: David  Martinique M.D.   On: 11/20/2018 12:42   Dg Femur, Min 2 Views Right  Result Date: 11/21/2018 CLINICAL DATA:  Intramedullary nail EXAM: DG C-ARM 61-120 MIN; RIGHT FEMUR 2 VIEWS COMPARISON:  11/20/2018 FINDINGS: Multiple C-arm images show gamma nail placement for fixation of an intertrochanteric fracture. Components appear well positioned. Distal locking screw in place. Good position and alignment.  IMPRESSION: Good appearance following ORIF for treatment of right intertrochanteric fracture. Electronically Signed   By: Nelson Chimes M.D.   On: 11/21/2018 14:08     CBC Recent Labs  Lab 11/20/18 1113 11/21/18 0526 11/22/18 0210  WBC 8.3 6.5 7.2  HGB 8.8* 8.6* 7.7*  HCT 31.2* 29.8* 26.3*  PLT 261 250 218  MCV 82.5 80.3 80.9  MCH 23.3* 23.2* 23.7*  MCHC 28.2* 28.9* 29.3*  RDW 19.7* 19.7* 19.6*  LYMPHSABS 1.2  --   --   MONOABS 0.7  --   --   EOSABS 0.1  --   --   BASOSABS 0.0  --   --     Chemistries  Recent Labs  Lab 11/20/18 1113 11/21/18 0526 11/22/18 0210  NA 138 135 137  K 4.0 4.6 4.4  CL 101 101 101  CO2 25 23 27   GLUCOSE 138* 115* 129*  BUN 20 22 30*  CREATININE 0.98 1.26* 1.26*  CALCIUM 8.3* 8.0* 8.0*     Lab Results  Component Value Date   HGBA1C 6.0 (H) 07/15/2017   ---------------------------------------------------------------------------------- Coagulation profile Recent Labs  Lab 11/20/18 1330  INR 1.10    Roxan Hockey M.D on 11/22/2018 at 3:49 PM  Pager---[phone] Go to [email or web address] - password [email or web address] for contact info  [facility] - Office  [phone]

## 2018-11-22 NOTE — Progress Notes (Signed)
Called Son to bring in power of attorney paperwork and DNR, Hearing aids and upper dentures. Which he did

## 2018-11-22 NOTE — Evaluation (Addendum)
Occupational Therapy Evaluation Patient Details Name: Vanessa Knox MRN: [id number] DOB: 1916-05-01 Today's Date: 11/22/2018    History of Present Illness Patient is a 84 y/o female presenting to the ED on 11/20/18 due to R hip pain following a fall at home. Xray revealing right intertrochanteric hip fracture. R IM nail on 11/21/18. Past medical history significant for hypertension and spinal stenosis.    Clinical Impression   This 84 y/o female presents with the above. At baseline pt resides in ILF, reports she is typically mod independent with ADLs and functional mobility using rollator for short distances and electric w/c for longer distance mobility. Pt presenting with pain, generalized weakness, and decreased standing balance. Pt requiring maxA+2 for bed mobility and for stand pivot transfers this session using RW. She currently requires minA for UB ADL, max-totalA for LB and toileting ADLs. Pt will benefit from continued acute OT services and recommend follow up therapy services in SNF setting after discharge to maximize her overall safety and independence with ADLs and mobility. Will follow.     Follow Up Recommendations  SNF;Supervision/Assistance - 24 hour    Equipment Recommendations  3 in 1 bedside commode;Other (comment)(TBD in next venue)           Precautions / Restrictions Precautions Precautions: Fall Restrictions Weight Bearing Restrictions: Yes RLE Weight Bearing: Weight bearing as tolerated      Mobility Bed Mobility Overal bed mobility: Needs Assistance Bed Mobility: Supine to Sit     Supine to sit: Max assist;+2 for physical assistance     General bed mobility comments: requires Max +2 to bring R LE towards EOB and then for trunk righting; use of bed pad to come fully to EOB  Transfers Overall transfer level: Needs assistance Equipment used: Rolling walker (2 wheeled) Transfers: Sit to/from [facility] Sit to Stand: Max assist;+2 physical  assistance;From elevated surface Stand pivot transfers: Max assist;+2 physical assistance       General transfer comment: Max A + 2 to power up from bedside; excessive forward trunk flexion with poor hand hold on RW requiring consistent verbal cueing; max cueing to complete stand pivot transfer with assist needed at trunk and at RW; requires MAx A for positioning in recliner    Balance Overall balance assessment: Needs assistance Sitting-balance support: Bilateral upper extremity supported;Feet supported Sitting balance-Leahy Scale: Fair     Standing balance support: Bilateral upper extremity supported;During functional activity Standing balance-Leahy Scale: Poor Standing balance comment: reliant on RW and external support                           ADL either performed or assessed with clinical judgement   ADL Overall ADL's : Needs assistance/impaired Eating/Feeding: Modified independent;Sitting   Grooming: Set up;Min guard;Sitting   Upper Body Bathing: Min guard;Sitting   Lower Body Bathing: Moderate assistance;Maximal assistance;+2 for physical assistance;+2 for safety/equipment;Sit to/from stand   Upper Body Dressing : Min guard;Sitting   Lower Body Dressing: Total assistance;+2 for physical assistance;+2 for safety/equipment;Sit to/from stand Lower Body Dressing Details (indicate cue type and reason): pt able to lift RLE for therapist to assist with threading LE into mesh underwear; requires assist to don and for doffing/donning socks Toilet Transfer: Maximal assistance;+2 for physical assistance;+2 for safety/equipment;RW;Stand-pivot Toilet Transfer Details (indicate cue type and reason): simulated in transfer to [place] and Hygiene: Total assistance;+2 for physical assistance;+2 for safety/equipment;Sit to/from stand Toileting - Water quality scientist Details (indicate cue  type and reason): pt incontinent of urine upon standing (pt  aware and verbalizes typically incontinent); requires assist for peri-care     Functional mobility during ADLs: Maximal assistance;+2 for physical assistance;+2 for safety/equipment(stand pivot transfer only) General ADL Comments: pt limited due to pain and generalized weakness      Vision         Perception     Praxis      Pertinent Vitals/Pain Pain Assessment: Faces Faces Pain Scale: Hurts whole lot Pain Location: R hip(with mobility) Pain Descriptors / Indicators: Aching;Discomfort;Grimacing;Guarding;Moaning Pain Intervention(s): Limited activity within patient's tolerance;Monitored during session;Repositioned     Hand Dominance     Extremity/Trunk Assessment Upper Extremity Assessment Upper Extremity Assessment: Generalized weakness   Lower Extremity Assessment Lower Extremity Assessment: Defer to PT evaluation RLE Deficits / Details: unable to lift R LE against gravity - requires physical assist for all mobility of this LE RLE: Unable to fully assess due to pain RLE Sensation: WNL RLE Coordination: decreased gross motor;decreased fine motor   Cervical / Trunk Assessment Cervical / Trunk Assessment: Normal   Communication Communication Communication: HOH   Cognition Arousal/Alertness: Awake/alert Behavior During Therapy: WFL for tasks assessed/performed Overall Cognitive Status: No family/caregiver present to determine baseline cognitive functioning                                 General Comments: Able to answer all question appropriately. Patient occasionally appears some what confused during session and reporting confusion of time of day - states "I can't believe I slept that long"   General Comments  patient fearful during transfer; requires consistent cueing for safety    Exercises     Shoulder Instructions      Home Living Family/patient expects to be discharged to:: Assisted living(independent living)                              Home Equipment: Cane - single point;Electric scooter;Walker - 4 wheels;Transport chair;Grab bars - tub/shower;Grab bars - toilet;Shower seat          Prior Functioning/Environment Level of Independence: Independent with assistive device(s)        Comments: using rollator for mobility within home and short distances; electric scooter for going to meals/longer distances; daughter-in-law drives for grocercies         OT Problem List: Decreased strength;Decreased range of motion;Decreased activity tolerance;Impaired balance (sitting and/or standing);Decreased safety awareness;Decreased knowledge of use of DME or AE;Pain      OT Treatment/Interventions: Self-care/ADL training;Therapeutic exercise;DME and/or AE instruction;Therapeutic activities;Patient/family education;Balance training    OT Goals(Current goals can be found in the care plan section) Acute Rehab OT Goals Patient Stated Goal: regain mobility OT Goal Formulation: With patient Time For Goal Achievement: 12/06/18 Potential to Achieve Goals: Good  OT Frequency: Min 2X/week   Barriers to D/C:            Co-evaluation PT/OT/SLP Co-Evaluation/Treatment: Yes Reason for Co-Treatment: Complexity of the patient's impairments (multi-system involvement);For patient/therapist safety;To address functional/ADL transfers PT goals addressed during session: Mobility/safety with mobility;Balance;Proper use of DME OT goals addressed during session: ADL's and self-care;Proper use of Adaptive equipment and DME      AM-PAC OT "6 Clicks" Daily Activity     Outcome Measure Help from another person eating meals?: None Help from another person taking care of personal grooming?: A Little Help from another  person toileting, which includes using toliet, bedpan, or urinal?: Total Help from another person bathing (including washing, rinsing, drying)?: A Lot Help from another person to put on and taking off regular upper body clothing?:  A Little Help from another person to put on and taking off regular lower body clothing?: Total 6 Click Score: 14   End of Session Equipment Utilized During Treatment: Gait belt;Rolling walker Nurse Communication: Mobility status  Activity Tolerance: Patient tolerated treatment well;Patient limited by pain Patient left: in chair;with call bell/phone within reach;with chair alarm set  OT Visit Diagnosis: Muscle weakness (generalized) (M62.81);Unsteadiness on feet (R26.81);Pain Pain - Right/Left: Right Pain - part of body: Leg                Time: 7867-5449 OT Time Calculation (min): 38 min Charges:  OT General Charges $OT Visit: 1 Visit OT Evaluation $OT Eval Moderate Complexity: 1 Mod OT Treatments $Self Care/Home Management : 8-22 mins  Lou Cal, OT [facility] Pager [phone] Office [phone]   Raymondo Band 11/22/2018, 1:16 PM

## 2018-11-22 NOTE — Progress Notes (Signed)
Received Vanessa Knox DNR and Health Power of attorney, living will

## 2018-11-23 ENCOUNTER — Encounter (HOSPITAL_COMMUNITY): Payer: Self-pay | Admitting: Orthopedic Surgery

## 2018-11-23 LAB — CBC
HCT: 23.7 % — ABNORMAL LOW (ref 36.0–46.0)
Hemoglobin: 6.9 g/dL — CL (ref 12.0–15.0)
MCH: 23.5 pg — ABNORMAL LOW (ref 26.0–34.0)
MCHC: 29.1 g/dL — ABNORMAL LOW (ref 30.0–36.0)
MCV: 80.9 fL (ref 80.0–100.0)
Platelets: 235 10*3/uL (ref 150–400)
RBC: 2.93 MIL/uL — ABNORMAL LOW (ref 3.87–5.11)
RDW: 19.9 % — ABNORMAL HIGH (ref 11.5–15.5)
WBC: 8.6 10*3/uL (ref 4.0–10.5)
nRBC: 0 % (ref 0.0–0.2)

## 2018-11-23 LAB — PREPARE RBC (CROSSMATCH)

## 2018-11-23 MED ORDER — SODIUM CHLORIDE 0.9% IV SOLUTION
Freq: Once | INTRAVENOUS | Status: AC
  Administered 2018-11-23: 05:00:00 via INTRAVENOUS

## 2018-11-23 MED ORDER — FUROSEMIDE 10 MG/ML IJ SOLN
20.0000 mg | Freq: Once | INTRAMUSCULAR | Status: AC
  Administered 2018-11-23: 20 mg via INTRAVENOUS
  Filled 2018-11-23: qty 2

## 2018-11-23 NOTE — Progress Notes (Signed)
CRITICAL VALUE ALERT  Critical Value:  Hgb 6.9  Date & Time Notied:  11/23/2018 , 9539  Provider Notified: [email or web address], Dr Osa Craver   Orders Received/Actions taken: Called blood bank to see if blood available for patient and there is NO blood available for this pt.

## 2018-11-23 NOTE — Plan of Care (Signed)
  Problem: Activity: Goal: Risk for activity intolerance will decrease Outcome: Progressing   Problem: Coping: Goal: Level of anxiety will decrease Outcome: Progressing   Problem: Pain Managment: Goal: General experience of comfort will improve Outcome: Progressing   Problem: Safety: Goal: Ability to remain free from injury will improve Outcome: Progressing   Problem: Skin Integrity: Goal: Risk for impaired skin integrity will decrease Outcome: Progressing   

## 2018-11-23 NOTE — Progress Notes (Signed)
Patient Demographics:    Vanessa Knox, is a 84 y.o. female, DOB - 07-08-1916, ZOX:[id number]  Admit date - 11/20/2018   Admitting Physician Lady Deutscher, MD  Outpatient Primary MD for the patient is Leanna Battles, MD  LOS - 3   Chief Complaint  Patient presents with  . Hip Pain        Subjective:    Vanessa Knox today has no fevers, no emesis,  No chest pain, c/o right hip pain, patient with orthostatic dizziness, suspect due to anemia  Assessment  & Plan :    Principal Problem:   Comminuted fracture of hip, right, closed, initial encounter [facility]) Active Problems:   Compression fracture of L5 lumbar vertebra, sequela   Spinal stenosis of lumbar region with neurogenic claudication   Chronic low back pain with left-sided sciatica   HTN (hypertension)   Multiple closed pelvic fractures without disruption of pelvic circle (HCC)   Closed right hip fracture, initial encounter [facility])  Brief summary 84 y.o. female with medical history significant of hypertension and spinal stenosis admitted on 11/20/2018 with right hip pain and found to have a right hip fracture after mechanical fall at home.  Status post IM nail procedure, now requiring transfusion due to symptomatic anemia  Plan:- 1)Rt Hip Fx - s/p IM Nail procedure on 11/21/2018, as per orthopedic team weightbearing as tolerated  2)Closed pelvic fractures--- conservative management advised  3)HTN-stable, continue Toprol-XL 25 mg daily,   may use IV Hydralazine 10 mg  Every 4 hours Prn for systolic blood pressure over 160 mmhg  4) history of chronic compression fracture of L5 as well as chronic back pain with left-sided sciatica and spinal stenosis of the lumbar lesion with neurogenic claudication--- continue physical therapy and pain management  5)Acute blood loss anemia--- due to hip fracture and subsequent surgery, hemoglobin down to 6.9  from 8.6, patient with orthostatic dizziness, not very tachycardic due to Toprol-XL use,  Risk, benefits and alternatives to transfusion of blood products discussed. Indication for transfusion discussed. Consent obtained Please Transfuse 2 units of packed red blood cells, please give each unit of PRBC over 3 hours, please give Lasix 40 mg IV x1 after first unit of packed cells is infused   6)Social/Ethics--patient lived in independent living apartments prior to admission, she is a DNR/DNI  Disposition/Need for in-Hospital Stay- patient unable to be discharged at this time due to patient needs insurance approval to go to SNF rehab after hip surgery--- requiring transfusion of packed red blood cells due to anemia  Code Status : DNR   Disposition Plan  : SNF  Consults  :  ortho   DVT Prophylaxis  : Heparin   Lab Results  Component Value Date   PLT 235 11/23/2018    Inpatient Medications  Scheduled Meds: . acetaminophen  650 mg Oral Q6H  . chlorhexidine  60 mL Topical Once  . feeding supplement  237 mL Oral Q1200  . gabapentin  100 mg Oral QHS  . metoprolol succinate  25 mg Oral Daily  . povidone-iodine  2 application Topical Once   Continuous Infusions: . lactated ringers Stopped (11/22/18 1730)  . lactated ringers Stopped (11/23/18 0700)  . methocarbamol (ROBAXIN) IV     PRN  Meds:.hydrALAZINE, HYDROcodone-acetaminophen, methocarbamol **OR** methocarbamol (ROBAXIN) IV, morphine injection, ondansetron **OR** ondansetron (ZOFRAN) IV, polyethylene glycol, traMADol, traMADol    Anti-infectives (From admission, onward)   Start     Dose/Rate Route Frequency Ordered Stop   11/20/18 1730  clindamycin (CLEOCIN) IVPB 900 mg     900 mg 100 mL/hr over 30 Minutes Intravenous To [facility] 11/20/18 1711 11/21/18 1129        Objective:   Vitals:   11/23/18 0859 11/23/18 1100 11/23/18 1122 11/23/18 1345  BP: (!) 147/64 (!) 121/54 (!) 132/56 (!) 134/49  Pulse: 84 75 74 74    Resp: 17 17 17 15   Temp: 98 F (36.7 C) 98.3 F (36.8 C) 98.3 F (36.8 C) 97.7 F (36.5 C)  TempSrc: Oral Oral Oral Oral  SpO2: 95% 95% 98% 99%  Weight:      Height:        Wt Readings from Last 3 Encounters:  11/21/18 66.9 kg     Intake/Output Summary (Last 24 hours) at 11/23/2018 1744 Last data filed at 11/23/2018 1345 Gross per 24 hour  Intake 892.67 ml  Output 500 ml  Net 392.67 ml     Physical Exam Patient is examined daily including today on 11/23/18 , exams remain the same as of yesterday except that has changed   Gen:- Awake Alert,  In no apparent distress  HEENT:- [place].AT, No sclera icterus Ears- HOH Neck-Supple Neck,No JVD,.  Lungs-  CTAB , fair symmetrical air movement CV- S1, S2 normal, regular  Abd-  +ve B.Sounds, Abd Soft, No tenderness,    Extremity/Skin:- No  edema, pedal pulses present  Psych-affect is appropriate, oriented x3 Neuro-no new focal deficits, no tremors MSK--right hip area with appropriate postop tenderness, postop wound is intact  Data Review:   Micro Results Recent Results (from the past 240 hour(s))  MRSA PCR Screening     Status: None   Collection Time: 11/21/18 12:06 AM  Result Value Ref Range Status   MRSA by PCR NEGATIVE NEGATIVE Final    Comment:        The GeneXpert MRSA Assay (FDA approved for NASAL specimens only), is one component of a comprehensive MRSA colonization surveillance program. It is not intended to diagnose MRSA infection nor to guide or monitor treatment for MRSA infections. Performed at [facility], [place] [address], [place], [place] [address]     Radiology Reports Dg Chest 1 View  Result Date: 11/20/2018 CLINICAL DATA:  Status post fall with right hip fracture. EXAM: CHEST  1 VIEW COMPARISON:  Scout radiograph from a right shoulder CT which included the chest dated November 16, 2016. FINDINGS: The lungs are well-expanded. The interstitial markings are mildly prominent. The cardiac silhouette is  enlarged. The pulmonary vascularity is mildly engorged with mild cephalization. There is no alveolar edema. There is no pleural effusion. There is calcification in the wall of the aortic arch. There are moderate degenerative changes of both shoulders. IMPRESSION: Low-grade CHF. Possible underlying chronic bronchitis. No acute pneumonia. Thoracic aortic atherosclerosis. Electronically Signed   By: David  Martinique M.D.   On: 11/20/2018 12:45   Dg Ankle Complete Right  Result Date: 11/20/2018 CLINICAL DATA:  Ankle pain.  Hip fracture. EXAM: RIGHT ANKLE - COMPLETE 3+ VIEW COMPARISON:  None. FINDINGS: Mild soft tissue swelling. Mild midfoot degenerative changes. No evidence of acute fracture or dislocation. IMPRESSION: Negative. Electronically Signed   By: Nelson Chimes M.D.   On: 11/20/2018 14:19   Ct Head  Wo Contrast  Result Date: 11/20/2018 CLINICAL DATA:  Status post fall with back pain. EXAM: CT HEAD WITHOUT CONTRAST CT CERVICAL SPINE WITHOUT CONTRAST TECHNIQUE: Multidetector CT imaging of the head and cervical spine was performed following the standard protocol without intravenous contrast. Multiplanar CT image reconstructions of the cervical spine were also generated. COMPARISON:  Head CT 07/14/2017 FINDINGS: CT HEAD FINDINGS Brain: No evidence of acute infarction, hemorrhage, hydrocephalus, extra-axial collection or mass lesion/mass effect. Moderate brain parenchymal volume loss and deep white matter microangiopathy. Vascular: Minimal calcific atherosclerotic disease of the intra cavernous carotid arteries. Skull: Normal. Negative for fracture or focal lesion. Sinuses/Orbits: No acute finding. Other: Post bilateral cataract extraction. CT CERVICAL SPINE FINDINGS Alignment: Straightening of the cervical lordosis. Skull base and vertebrae: No acute fracture. No primary bone lesion or focal pathologic process. Soft tissues and spinal canal: No prevertebral fluid or swelling. No visible canal hematoma. Disc  levels: Multilevel osteoarthritic changes with disc space narrowing, endplate sclerosis, remodeling of vertebral bodies and marked posterior facet arthropathy. Upper chest: Negative. Other: None. IMPRESSION: No acute intracranial abnormality. Atrophy, chronic microvascular disease. No evidence of acute traumatic injury to the cervical spine. Multilevel osteoarthritic changes of the cervical spine. Osteopenia. Electronically Signed   By: Fidela Salisbury M.D.   On: 11/20/2018 13:11   Ct Cervical Spine Wo Contrast  Result Date: 11/20/2018 CLINICAL DATA:  Status post fall with back pain. EXAM: CT HEAD WITHOUT CONTRAST CT CERVICAL SPINE WITHOUT CONTRAST TECHNIQUE: Multidetector CT imaging of the head and cervical spine was performed following the standard protocol without intravenous contrast. Multiplanar CT image reconstructions of the cervical spine were also generated. COMPARISON:  Head CT 07/14/2017 FINDINGS: CT HEAD FINDINGS Brain: No evidence of acute infarction, hemorrhage, hydrocephalus, extra-axial collection or mass lesion/mass effect. Moderate brain parenchymal volume loss and deep white matter microangiopathy. Vascular: Minimal calcific atherosclerotic disease of the intra cavernous carotid arteries. Skull: Normal. Negative for fracture or focal lesion. Sinuses/Orbits: No acute finding. Other: Post bilateral cataract extraction. CT CERVICAL SPINE FINDINGS Alignment: Straightening of the cervical lordosis. Skull base and vertebrae: No acute fracture. No primary bone lesion or focal pathologic process. Soft tissues and spinal canal: No prevertebral fluid or swelling. No visible canal hematoma. Disc levels: Multilevel osteoarthritic changes with disc space narrowing, endplate sclerosis, remodeling of vertebral bodies and marked posterior facet arthropathy. Upper chest: Negative. Other: None. IMPRESSION: No acute intracranial abnormality. Atrophy, chronic microvascular disease. No evidence of acute  traumatic injury to the cervical spine. Multilevel osteoarthritic changes of the cervical spine. Osteopenia. Electronically Signed   By: Fidela Salisbury M.D.   On: 11/20/2018 13:11   Dg C-arm 1-60 Min  Result Date: 11/21/2018 CLINICAL DATA:  Intramedullary nail EXAM: DG C-ARM 61-120 MIN; RIGHT FEMUR 2 VIEWS COMPARISON:  11/20/2018 FINDINGS: Multiple C-arm images show gamma nail placement for fixation of an intertrochanteric fracture. Components appear well positioned. Distal locking screw in place. Good position and alignment. IMPRESSION: Good appearance following ORIF for treatment of right intertrochanteric fracture. Electronically Signed   By: Nelson Chimes M.D.   On: 11/21/2018 14:08   Dg Hip Unilat W Or Wo Pelvis 2-3 Views Right  Result Date: 11/20/2018 CLINICAL DATA:  Patient fell earlier today. The patient reports severe pelvic and right hip discomfort. EXAM: DG HIP (WITH OR WITHOUT PELVIS) 2-3V RIGHT COMPARISON:  Limited views of the pelvis and right hip from a lumbar spine series of December 31, 2016. FINDINGS: The bones are subjectively osteopenic. There is deformity of the inferior  pubic ramus worrisome for an acute fracture. The left superior pubic ramus exhibits mild contour deformity in its midportion which could reflect an acute fracture is well. On the right the superior pubic ramus is intact while the inferior pubic ramus exhibits some cortical irregularity but no definite fracture. AP and lateral views of the right hip reveal a comminuted angulated intertrochanteric fracture which extends into the subtrochanteric region. The femoral head and neck appear intact and the femoral head remains in reasonable association with the acetabulum. IMPRESSION: Acute comminuted angulated intertrochanteric-sub trochanteric fracture of the right hip. Abnormal appearance of the superior and inferior pubic rami on the left could reflect nondisplaced fractures. There is mild cortical irregularity of the  right inferior pubic ramus without definite fracture. Electronically Signed   By: David  Martinique M.D.   On: 11/20/2018 12:42   Dg Femur, Min 2 Views Right  Result Date: 11/21/2018 CLINICAL DATA:  Intramedullary nail EXAM: DG C-ARM 61-120 MIN; RIGHT FEMUR 2 VIEWS COMPARISON:  11/20/2018 FINDINGS: Multiple C-arm images show gamma nail placement for fixation of an intertrochanteric fracture. Components appear well positioned. Distal locking screw in place. Good position and alignment. IMPRESSION: Good appearance following ORIF for treatment of right intertrochanteric fracture. Electronically Signed   By: Nelson Chimes M.D.   On: 11/21/2018 14:08     CBC Recent Labs  Lab 11/20/18 1113 11/21/18 0526 11/22/18 0210 11/23/18 0304  WBC 8.3 6.5 7.2 8.6  HGB 8.8* 8.6* 7.7* 6.9*  HCT 31.2* 29.8* 26.3* 23.7*  PLT 261 250 218 235  MCV 82.5 80.3 80.9 80.9  MCH 23.3* 23.2* 23.7* 23.5*  MCHC 28.2* 28.9* 29.3* 29.1*  RDW 19.7* 19.7* 19.6* 19.9*  LYMPHSABS 1.2  --   --   --   MONOABS 0.7  --   --   --   EOSABS 0.1  --   --   --   BASOSABS 0.0  --   --   --     Chemistries  Recent Labs  Lab 11/20/18 1113 11/21/18 0526 11/22/18 0210  NA 138 135 137  K 4.0 4.6 4.4  CL 101 101 101  CO2 25 23 27   GLUCOSE 138* 115* 129*  BUN 20 22 30*  CREATININE 0.98 1.26* 1.26*  CALCIUM 8.3* 8.0* 8.0*     Lab Results  Component Value Date   HGBA1C 6.0 (H) 07/15/2017   ---------------------------------------------------------------------------------- Coagulation profile Recent Labs  Lab 11/20/18 1330  INR 1.10    Roxan Hockey M.D on 11/23/2018 at 5:44 PM  Pager---[phone] Go to [email or web address] - [email or web address] for contact info  [facility] - Office  [phone]

## 2018-11-23 NOTE — Plan of Care (Signed)
  Problem: Activity: Goal: Risk for activity intolerance will decrease Outcome: Progressing   Problem: Coping: Goal: Level of anxiety will decrease Outcome: Progressing   Problem: Pain Managment: Goal: General experience of comfort will improve Outcome: Progressing   Problem: Safety: Goal: Ability to remain free from injury will improve Outcome: Progressing   

## 2018-11-23 NOTE — Care Management Important Message (Signed)
Important Message  Patient Details  Name: Vanessa Knox MRN: [id number] Date of Birth: Dec 28, 1915   Medicare Important Message Given:  Yes  Due to illness patient could not sign.  Ragna Kramlich 11/23/2018, 4:31 PM

## 2018-11-23 NOTE — Progress Notes (Signed)
Paged MD re: Lovenox. Pt has blood transfusion going. Awaiting response

## 2018-11-23 NOTE — Progress Notes (Signed)
    Subjective: Patient reports pain as mild at rest.  Worse when oob.  Generally feeling well.  Tolerating diet.   No CP, SOB.  No dizziness.  Energy a little low.  Objective:   VITALS:   Vitals:   11/22/18 2119 11/22/18 2355 11/23/18 0434 11/23/18 0515  BP: (!) 126/47 (!) 129/53 (!) 146/75 (!) 142/62  Pulse: 85  84 85  Resp:   16 16  Temp: (!) 97.5 F (36.4 C)  (!) 97.5 F (36.4 C) 98.6 F (37 C)  TempSrc: Oral  Oral Oral  SpO2: 97%  95% 95%  Weight:      Height:       CBC Latest Ref Rng & Units 11/23/2018 11/22/2018 11/21/2018  WBC 4.0 - 10.5 K/uL 8.6 7.2 6.5  Hemoglobin 12.0 - 15.0 g/dL 6.9(LL) 7.7(L) 8.6(L)  Hematocrit 36.0 - 46.0 % 23.7(L) 26.3(L) 29.8(L)  Platelets 150 - 400 K/uL 235 218 250   BMP Latest Ref Rng & Units 11/22/2018 11/21/2018 11/20/2018  Glucose 70 - 99 mg/dL 129(H) 115(H) 138(H)  BUN 8 - 23 mg/dL 30(H) 22 20  Creatinine 0.44 - 1.00 mg/dL 1.26(H) 1.26(H) 0.98  Sodium 135 - 145 mmol/L 137 135 138  Potassium 3.5 - 5.1 mmol/L 4.4 4.6 4.0  Chloride 98 - 111 mmol/L 101 101 101  CO2 22 - 32 mmol/L 27 23 25   Calcium 8.9 - 10.3 mg/dL 8.0(L) 8.0(L) 8.3(L)   Intake/Output      12/08 0701 - 12/09 0700 12/09 0701 - 12/10 0700   P.O. 720    I.V. (mL/kg) 241.5 (3.6)    Total Intake(mL/kg) 961.5 (14.4)    Urine (mL/kg/hr) 300 (0.2)    Blood     Total Output 300    Net +661.5            Physical Exam: General: NAD.  Upright in bed asleep on arrival.  Calm, conversant. MSK RLE: Neurovascularly intact Sensation intact distally Feet warm Dorsiflexion/Plantar flexion intact Incision: dressing C/D/I   Assessment: 2 Days Post-Op  S/P Procedure(s) (LRB): INTRAMEDULLARY (IM) NAIL FEMORAL (Right) by Dr. Ernesta Amble. Percell Miller on 11/21/2018  Principal Problem:   Comminuted fracture of hip, right, closed, initial encounter [facility]) Active Problems:   Compression fracture of L5 lumbar vertebra, sequela   Spinal stenosis of lumbar region with neurogenic  claudication   Chronic low back pain with left-sided sciatica   HTN (hypertension)   Multiple closed pelvic fractures without disruption of pelvic circle (HCC)   Closed right hip fracture, initial encounter ([place])   Closed right intertrochanteric femur fracture, status post IM nail Doing well postop day 2 Pain controlled Early mobilization in room Desires discharge back to [place] if possible  Plan: Up with therapy Incentive Spirometry Apply ice as needed  Weightbearing: WBAT RLE Insicional and dressing care: Dressings left intact until follow-up Showering: Keep dressing dry VTE prophylaxis: Lovenox, SCDs, ambulation Pain control: Tylenol, minimize narcotics. Follow - up plan: 2 weeks Contact information:  Edmonia Lynch MD, Roxan Hockey PA-C  Dispo:   Therapy evaluations ongoing.  Continue recommendations per medicine and d/c when ready medically.  Patient desires discharge back to her previous assisted living facility.     Prudencio Burly III, PA-C 11/23/2018, 7:37 AM

## 2018-11-24 LAB — TYPE AND SCREEN
ABO/RH(D): A POS
ANTIBODY SCREEN: NEGATIVE
UNIT DIVISION: 0
Unit division: 0

## 2018-11-24 LAB — BPAM RBC
BLOOD PRODUCT EXPIRATION DATE: 202001022359
Blood Product Expiration Date: 202001022359
ISSUE DATE / TIME: 201912090438
ISSUE DATE / TIME: 201912091049
Unit Type and Rh: 6200
Unit Type and Rh: 6200

## 2018-11-24 LAB — BASIC METABOLIC PANEL
Anion gap: 7 (ref 5–15)
BUN: 32 mg/dL — ABNORMAL HIGH (ref 8–23)
CO2: 31 mmol/L (ref 22–32)
CREATININE: 1.25 mg/dL — AB (ref 0.44–1.00)
Calcium: 8.2 mg/dL — ABNORMAL LOW (ref 8.9–10.3)
Chloride: 104 mmol/L (ref 98–111)
GFR calc Af Amer: 41 mL/min — ABNORMAL LOW (ref >60)
GFR calc non Af Amer: 35 mL/min — ABNORMAL LOW (ref >60)
Glucose, Bld: 115 mg/dL — ABNORMAL HIGH (ref 70–99)
Potassium: 4.6 mmol/L (ref 3.5–5.1)
Sodium: 142 mmol/L (ref 135–145)

## 2018-11-24 LAB — CBC
HCT: 32.3 % — ABNORMAL LOW (ref 36.0–46.0)
Hemoglobin: 10.1 g/dL — ABNORMAL LOW (ref 12.0–15.0)
MCH: 25.8 pg — AB (ref 26.0–34.0)
MCHC: 31.3 g/dL (ref 30.0–36.0)
MCV: 82.4 fL (ref 80.0–100.0)
Platelets: 229 10*3/uL (ref 150–400)
RBC: 3.92 MIL/uL (ref 3.87–5.11)
RDW: 18.6 % — ABNORMAL HIGH (ref 11.5–15.5)
WBC: 7.8 10*3/uL (ref 4.0–10.5)
nRBC: 0 % (ref 0.0–0.2)

## 2018-11-24 MED ORDER — ENOXAPARIN SODIUM 30 MG/0.3ML ~~LOC~~ SOLN
30.0000 mg | SUBCUTANEOUS | Status: DC
  Administered 2018-11-24: 30 mg via SUBCUTANEOUS
  Filled 2018-11-24: qty 0.3

## 2018-11-24 MED ORDER — BISACODYL 10 MG RE SUPP
10.0000 mg | Freq: Once | RECTAL | Status: AC
  Administered 2018-11-24: 10 mg via RECTAL
  Filled 2018-11-24: qty 1

## 2018-11-24 MED ORDER — BISACODYL 10 MG RE SUPP: 10.0000 mg | Freq: Every day | RECTAL | Status: DC | PRN

## 2018-11-24 MED ORDER — DOCUSATE SODIUM 100 MG PO CAPS
100.0000 mg | ORAL_CAPSULE | Freq: Two times a day (BID) | ORAL | Status: DC
  Administered 2018-11-24 (×2): 100 mg via ORAL
  Filled 2018-11-24 (×3): qty 1

## 2018-11-24 NOTE — Progress Notes (Signed)
Physical Therapy Treatment Patient Details Name: Vanessa Knox MRN: [id number] DOB: 1916-07-05 Today's Date: 11/24/2018    History of Present Illness Patient is a 84 y/o female presenting to the ED on 11/20/18 due to R hip pain following a fall at home. Xray revealing right intertrochanteric hip fracture. R IM nail on 11/21/18. Past medical history significant for hypertension and spinal stenosis.     PT Comments    Patient seen for mobility progression. Pt requires max A +2 for functional transfers due to pain and anxiety in standing. Continue to progress as tolerated with anticipated d/c to SNF for further skilled PT services.     Follow Up Recommendations  SNF;Supervision/Assistance - 24 hour     Equipment Recommendations  Other (comment)(defer)    Recommendations for Other Services       Precautions / Restrictions Precautions Precautions: Fall Restrictions Weight Bearing Restrictions: Yes RLE Weight Bearing: Weight bearing as tolerated    Mobility  Bed Mobility Overal bed mobility: Needs Assistance Bed Mobility: Supine to Sit;Rolling Rolling: Mod assist;+2 for physical assistance   Supine to sit: Max assist;HOB elevated     General bed mobility comments: assist to roll toward L side for removal of bed pan and pericare with use of rail; assist to bring R LE and hips to EOB wiht bed pad and to elevate trunk into sitting with use of rail; cues for sequencing   Transfers Overall transfer level: Needs assistance Equipment used: Rolling walker (2 wheeled) Transfers: Sit to/from [facility] Sit to Stand: Max assist;+2 physical assistance;From elevated surface Stand pivot transfers: Max assist;+2 physical assistance       General transfer comment: pt unable to complete entire stand pivot and chair brought up behind her; assist to power up into standing and for balance, weight shifting, hip extension and guiding RW to pivot; pt is able to lift R foot from  floor but unable to lift L foot from floor  Ambulation/Gait             General Gait Details: unable to progress to gait training due to pain   Stairs             Wheelchair Mobility    Modified Rankin (Stroke Patients Only)       Balance Overall balance assessment: Needs assistance Sitting-balance support: Bilateral upper extremity supported;Feet supported Sitting balance-Leahy Scale: Fair     Standing balance support: Bilateral upper extremity supported;During functional activity Standing balance-Leahy Scale: Zero Standing balance comment: reliant on RW and external support                            Cognition Arousal/Alertness: Awake/alert Behavior During Therapy: WFL for tasks assessed/performed;Anxious Overall Cognitive Status: Within Functional Limits for tasks assessed                                 General Comments: nees constant cues while up in standing for safe use of AD       Exercises      General Comments        Pertinent Vitals/Pain Pain Assessment: Faces Faces Pain Scale: Hurts whole lot Pain Location: R hip with mobility Pain Descriptors / Indicators: Grimacing;Guarding;Moaning;Sore Pain Intervention(s): Limited activity within patient's tolerance;Monitored during session;Repositioned    Home Living  Prior Function            PT Goals (current goals can now be found in the care plan section) Acute Rehab PT Goals Patient Stated Goal: regain mobility Progress towards PT goals: Progressing toward goals    Frequency    Min 3X/week      PT Plan Current plan remains appropriate    Co-evaluation              AM-PAC PT "6 Clicks" Mobility   Outcome Measure  Help needed turning from your back to your side while in a flat bed without using bedrails?: Total Help needed moving from lying on your back to sitting on the side of a flat bed without using bedrails?:  Total Help needed moving to and from a bed to a chair (including a wheelchair)?: A Lot Help needed standing up from a chair using your arms (e.g., wheelchair or bedside chair)?: A Lot Help needed to walk in hospital room?: Total Help needed climbing 3-5 steps with a railing? : Total 6 Click Score: 8    End of Session Equipment Utilized During Treatment: Gait belt Activity Tolerance: Patient limited by fatigue;Patient limited by pain Patient left: in chair;with call bell/phone within reach;with chair alarm set Nurse Communication: Mobility status PT Visit Diagnosis: Unsteadiness on feet (R26.81);Other abnormalities of gait and mobility (R26.89);Muscle weakness (generalized) (M62.81);History of falling (Z91.81)     Time: 7416-3845 PT Time Calculation (min) (ACUTE ONLY): 23 min  Charges:  $Therapeutic Activity: 23-37 mins                     Earney Navy, PTA Acute Rehabilitation Services Pager: [phone] Office: [phone]     Darliss Cheney 11/24/2018, 4:25 PM

## 2018-11-24 NOTE — Progress Notes (Signed)
    Subjective: Patient reports pain as mild at rest.  Worse when oob but improving.  Generally feeling well.  Tolerating diet.   No CP, SOB.  No BM yet.  Objective:   VITALS:   Vitals:   11/23/18 1122 11/23/18 1345 11/23/18 1957 11/24/18 0419  BP: (!) 132/56 (!) 134/49 (!) 124/53 (!) 156/62  Pulse: 74 74 75 70  Resp: 17 15 18 14   Temp: 98.3 F (36.8 C) 97.7 F (36.5 C) 98 F (36.7 C) 97.6 F (36.4 C)  TempSrc: Oral Oral Oral Oral  SpO2: 98% 99% 95% 96%  Weight:      Height:       CBC Latest Ref Rng & Units 11/24/2018 11/23/2018 11/22/2018  WBC 4.0 - 10.5 K/uL 7.8 8.6 7.2  Hemoglobin 12.0 - 15.0 g/dL 10.1(L) 6.9(LL) 7.7(L)  Hematocrit 36.0 - 46.0 % 32.3(L) 23.7(L) 26.3(L)  Platelets 150 - 400 K/uL 229 235 218   BMP Latest Ref Rng & Units 11/24/2018 11/22/2018 11/21/2018  Glucose 70 - 99 mg/dL 115(H) 129(H) 115(H)  BUN 8 - 23 mg/dL 32(H) 30(H) 22  Creatinine 0.44 - 1.00 mg/dL 1.25(H) 1.26(H) 1.26(H)  Sodium 135 - 145 mmol/L 142 137 135  Potassium 3.5 - 5.1 mmol/L 4.6 4.4 4.6  Chloride 98 - 111 mmol/L 104 101 101  CO2 22 - 32 mmol/L 31 27 23   Calcium 8.9 - 10.3 mg/dL 8.2(L) 8.0(L) 8.0(L)   Intake/Output      12/09 0701 - 12/10 0700   P.O. 480   Blood 652.7   Total Intake(mL/kg) 1132.7 (16.9)   Urine (mL/kg/hr) 1850 (1.2)   Total Output 1850   Net -717.3          Physical Exam: General: NAD.  Upright in bed asleep on arrival.  Calm, conversant. MSK RLE: Neurovascularly intact Sensation intact distally Feet warm Dorsiflexion/Plantar flexion intact Incision: dressing C/D/I   Assessment: 3 Days Post-Op  S/P Procedure(s) (LRB): INTRAMEDULLARY (IM) NAIL FEMORAL (Right) by Dr. Ernesta Amble. Percell Miller on 11/21/2018  Principal Problem:   Comminuted fracture of hip, right, closed, initial encounter [facility]) Active Problems:   Compression fracture of L5 lumbar vertebra, sequela   Spinal stenosis of lumbar region with neurogenic claudication   Chronic low back pain with  left-sided sciatica   HTN (hypertension)   Multiple closed pelvic fractures without disruption of pelvic circle (HCC)   Closed right hip fracture, initial encounter ([place])   Closed right intertrochanteric femur fracture, status post IM nail Doing well postop day 3 Stable from an orthopedic perspective Pain controlled Early mobilization in room Desires discharge back to [place] if possible  Plan: Up with therapy Incentive Spirometry Apply ice as needed Bowel regimen  Weightbearing: WBAT RLE Insicional and dressing care: Dressings left intact until follow-up Showering: Keep dressing dry VTE prophylaxis: Lovenox, SCDs, ambulation Pain control: Tylenol, minimize narcotics. Contact information:  Edmonia Lynch MD, Roxan Hockey Vanessa Knox  Dispo:   Therapy ongoing.  Continue recommendations per medicine and d/c when ready medically.  Patient desires discharge back to her previous assisted living facility.  Follow-up in the office with Dr. Alain Marion at 2 weeks postop.  Please call with questions.    Vanessa Elizabeth Martensen Knox, Vanessa Knox 11/24/2018, 6:24 AM

## 2018-11-24 NOTE — Clinical Social Work Note (Signed)
Clinical Social Work Assessment  Patient Details  Name: Sumedha Navratil MRN: [id number] Date of Birth: 1916-11-10  Date of referral:  11/24/18               Reason for consult:  Discharge Planning                Permission sought to share information with:  Case Manager, Facility Sport and exercise psychologist, Family Supports Permission granted to share information::  Yes, Verbal Permission Granted  Name::     Software engineer::  SNFs  Relationship::  son  Contact Information:  [phone]  Housing/Transportation Living arrangements for the past 2 months:  Independent Living Facility([place] [facility]) Source of Information:  Patient Patient Interpreter Needed:  None, Other (Comment Required)(hard of hearing and both hearing aids are not working right now) Architect Involvement Pertinent to Current Situation/Hospitalization:  No - Comment as needed Significant Relationships:  Adult Children Lives with:  Self Do you feel safe going back to the place where you live?  No Need for family participation in patient care:  Yes (Comment)  Care giving concerns:  CSW received referral for possible SNF placement at time of discharge. Spoke with patient regarding possibility of SNF placement . Patient's  Son and family  are currently unable to care for her at their home given patient's current needs and fall risk. Patient has been living at [facility] but needs PT to gain strength/mobility before returning.  Patient and son [name] expressed understanding of PT recommendation and are agreeable to SNF placement at time of discharge. CSW to continue to follow and assist with discharge planning needs.     Social Worker assessment / plan:  Spoke with patient face to face and her son [name] via phone concerning possibility of rehab at [facility] before returning home.    Employment status:  Retired Nurse, adult PT Recommendations:  [place] /  Referral to community resources:  [place]  Patient/Family's Response to care:  Patient and  Son [name]   recognize need for rehab before returning home and are agreeable to a SNF in [place]. They report no preferences at this point, family will be researching SNFs today and will call social worker with their top choices. CSW will then provide medicare.gov list with their top three choices, including quality  Measure ratings and put hard copy in chart   . CSW explained insurance authorization process. Patient's family reported that they want patient to get stronger to be able to come back home.    Patient/Family's Understanding of and Emotional Response to Diagnosis, Current Treatment, and Prognosis:  Patient/family is realistic regarding therapy needs and expressed being hopeful for SNF placement. Patient expressed understanding of CSW role and discharge process as well as medical condition. No questions/concerns about plan or treatment.    Emotional Assessment Appearance:  Appears stated age Attitude/Demeanor/Rapport:  Gracious Affect (typically observed):  Accepting, Adaptable Orientation:  Oriented to Self, Oriented to Place, Oriented to  Time, Oriented to Situation Alcohol / Substance use:    Psych involvement (Current and /or in the community):  No (Comment)  Discharge Needs  Concerns to be addressed:  Discharge Planning Concerns Readmission within the last 30 days:  No Current discharge risk:  Dependent with Mobility Barriers to Discharge:  Continued Medical Work up   [employer], LCSW 11/24/2018, 10:47 AM

## 2018-11-24 NOTE — NC FL2 (Signed)
[place] LEVEL OF CARE SCREENING TOOL     IDENTIFICATION  Patient Name: Andersen Darden Birthdate: Ellise 30, 1917 Sex: female Admission Date (Current Location): 11/20/2018  [facility] and [place] Number:  Herbalist and Address:  [facility] [place] [address], [place], [place] [address]      Provider Number: [id number]  Attending Physician Name and Address:  Roxan Hockey, MD  Relative Name and Phone Number:       Current Level of Care: SNF Recommended Level of Care: [place] Prior Approval Number:    Date Approved/Denied:   PASRR Number:   [id number]   Discharge Plan: SNF    Current Diagnoses: Patient Active Problem List   Diagnosis Date Noted  . Multiple closed pelvic fractures without disruption of pelvic circle ([place]) 11/20/2018  . Comminuted fracture of hip, right, closed, initial encounter ([place]) 11/20/2018  . Closed right hip fracture, initial encounter ([place]) 11/20/2018  . TIA (transient ischemic attack) 07/14/2017  . HTN (hypertension) 07/14/2017  . Compression fracture of L5 lumbar vertebra, sequela 01/02/2017  . Spinal stenosis of lumbar region with neurogenic claudication 01/02/2017  . Chronic low back pain with left-sided sciatica 01/02/2017    Orientation RESPIRATION BLADDER Height & Weight     Self, Time, Situation, Place  Normal Continent Weight: 147 lb 7.8 oz (66.9 kg) Height:  5\' 5"  (165.1 cm)  BEHAVIORAL SYMPTOMS/MOOD NEUROLOGICAL BOWEL NUTRITION STATUS      Continent Diet(regular )  AMBULATORY STATUS COMMUNICATION OF NEEDS Skin   Limited Assist Verbally Other (Comment)(3 closed incisions on right thigh )                       Personal Care Assistance Level of Assistance  Bathing, Feeding, Dressing Bathing Assistance: Limited assistance Feeding assistance: Independent Dressing Assistance: Limited assistance     Functional Limitations Info             [place]  PT (By licensed PT), OT (By licensed OT)     PT Frequency: 5 OT Frequency: 5            Contractures      Additional Factors Info  Code Status, Allergies Code Status Info: DNR  Allergies Info: KEFLEX CEPHALEXIN            Current Medications (11/24/2018):  This is the current hospital active medication list Current Facility-Administered Medications  Medication Dose Route Frequency Provider Last Rate Last Dose  . acetaminophen (TYLENOL) tablet 650 mg  650 mg Oral Q6H Prudencio Burly III, PA-C   650 mg at 11/24/18 0514  . bisacodyl (DULCOLAX) suppository 10 mg  10 mg Rectal Daily PRN Prudencio Burly III, PA-C      . chlorhexidine (HIBICLENS) 4 % liquid 4 application  60 mL Topical Once Martensen, Charna Elizabeth III, PA-C      . docusate sodium (COLACE) capsule 100 mg  100 mg Oral BID Prudencio Burly III, PA-C      . feeding supplement (ENSURE SURGERY) liquid 237 mL  237 mL Oral Q1200 Emokpae, Courage, MD   237 mL at 11/22/18 1712  . gabapentin (NEURONTIN) capsule 100 mg  100 mg Oral QHS Prudencio Burly III, PA-C   100 mg at 11/23/18 2258  . hydrALAZINE (APRESOLINE) injection 10 mg  10 mg Intravenous Q6H PRN Emokpae, Courage, MD      . HYDROcodone-acetaminophen (NORCO/VICODIN) 5-325 MG per tablet 1-2 tablet  1-2 tablet Oral Q6H PRN Prudencio Burly III, PA-C   2 tablet at 11/21/18 1639  . lactated ringers infusion   Intravenous Continuous Prudencio Burly III, PA-C   Stopped at 11/22/18 1730  . lactated ringers infusion   Intravenous Continuous Prudencio Burly III, PA-C   Stopped at 11/23/18 0700  . methocarbamol (ROBAXIN) tablet 500 mg  500 mg Oral Q6H PRN Prudencio Burly III, PA-C   500 mg at 11/22/18 1727   Or  . methocarbamol (ROBAXIN) 500 mg in dextrose 5 % 50 mL IVPB  500 mg Intravenous Q6H PRN Prudencio Burly III, PA-C      . metoprolol succinate (TOPROL-XL) 24 hr tablet 25 mg  25 mg Oral Daily  Prudencio Burly III, PA-C   25 mg at 11/23/18 0900  . morphine 2 MG/ML injection 0.5 mg  0.5 mg Intravenous Q2H PRN Prudencio Burly III, PA-C   0.5 mg at 11/21/18 1437  . ondansetron (ZOFRAN) tablet 4 mg  4 mg Oral Q6H PRN Prudencio Burly III, PA-C       Or  . ondansetron [facility]) injection 4 mg  4 mg Intravenous Q6H PRN Prudencio Burly III, PA-C      . polyethylene glycol (MIRALAX / GLYCOLAX) packet 17 g  17 g Oral Daily PRN Prudencio Burly III, PA-C   17 g at 11/23/18 0900  . povidone-iodine 10 % swab 2 application  2 application Topical Once Martensen, Charna Elizabeth III, PA-C      . traMADol Veatrice Bourbon) tablet 100 mg  100 mg Oral Q6H PRN Prudencio Burly III, PA-C      . traMADol Veatrice Bourbon) tablet 50 mg  50 mg Oral Q6H PRN Prudencio Burly III, PA-C         Discharge Medications: Please see discharge summary for a list of discharge medications.  Relevant Imaging Results:  Relevant Lab Results:   Additional Information SS#: [id number]  Weston Anna, LCSW

## 2018-11-24 NOTE — Progress Notes (Addendum)
Patient Demographics:    Vanessa Knox, is a 84 y.o. female, DOB - 04-27-1916, WER:[id number]  Admit date - 11/20/2018   Admitting Physician Lady Deutscher, MD  Outpatient Primary MD for the patient is Leanna Battles, MD  LOS - 4   Chief Complaint  Patient presents with  . Hip Pain        Subjective:    Vanessa Knox today has no fevers, no emesis,  No chest pain, c/o right hip pain, no shortness of breath, no new complaints  Assessment  & Plan :    Principal Problem:   Comminuted fracture of hip, right, closed, initial encounter [facility]) Active Problems:   Compression fracture of L5 lumbar vertebra, sequela   Spinal stenosis of lumbar region with neurogenic claudication   Chronic low back pain with left-sided sciatica   HTN (hypertension)   Multiple closed pelvic fractures without disruption of pelvic circle (HCC)   Closed right hip fracture, initial encounter [facility])  Brief Summary 84 y.o. female with medical history significant of hypertension and spinal stenosis admitted on 11/20/2018 with right hip pain and found to have a right hip fracture after mechanical fall at home.  Status post IM nail procedure, required transfusion due to symptomatic anemia  Plan:- 1)Rt Hip Fx - s/p IM Nail procedure on 11/21/2018, as per orthopedic team weightbearing as tolerated  2)Closed pelvic fractures--- conservative management advised  3)HTN-stable, continue Toprol-XL 25 mg daily,   may use IV Hydralazine 10 mg  Every 4 hours Prn for systolic blood pressure over 160 mmhg  4) history of chronic compression fracture of L5 as well as chronic back pain with left-sided sciatica and spinal stenosis of the lumbar lesion with neurogenic claudication--- continue physical therapy and pain management  5)Acute blood Loss Anemia--- due to hip fracture and subsequent surgery, hemoglobin up to 10.1 from  6.9 after  transfusion of 2 units of packed cells on 11/23/2018  6)Social/Ethics--patient lived in independent living apartments prior to admission, she is a DNR/DNI  Disposition/Need for in-Hospital Stay- patient unable to be discharged at this time due to patient needs insurance approval to go to SNF rehab after hip surgery---  Code Status : DNR  Disposition Plan  : SNF  Consults  :  ortho  DVT Prophylaxis  :  Lovenox  Lab Results  Component Value Date   PLT 229 11/24/2018    Inpatient Medications  Scheduled Meds: . acetaminophen  650 mg Oral Q6H  . chlorhexidine  60 mL Topical Once  . docusate sodium  100 mg Oral BID  . feeding supplement  237 mL Oral Q1200  . gabapentin  100 mg Oral QHS  . metoprolol succinate  25 mg Oral Daily  . povidone-iodine  2 application Topical Once   Continuous Infusions: . lactated ringers Stopped (11/22/18 1730)  . lactated ringers Stopped (11/23/18 0700)  . methocarbamol (ROBAXIN) IV     PRN Meds:.bisacodyl, hydrALAZINE, HYDROcodone-acetaminophen, methocarbamol **OR** methocarbamol (ROBAXIN) IV, morphine injection, ondansetron **OR** ondansetron (ZOFRAN) IV, polyethylene glycol, traMADol, traMADol    Anti-infectives (From admission, onward)   Start     Dose/Rate Route Frequency Ordered Stop   11/20/18 1730  clindamycin (CLEOCIN) IVPB 900 mg     900 mg 100 mL/hr  over 30 Minutes Intravenous To [facility] 11/20/18 1711 11/21/18 1129        Objective:   Vitals:   11/23/18 1345 11/23/18 1957 11/24/18 0419 11/24/18 1034  BP: (!) 134/49 (!) 124/53 (!) 156/62   Pulse: 74 75 70 68  Resp: 15 18 14    Temp: 97.7 F (36.5 C) 98 F (36.7 C) 97.6 F (36.4 C)   TempSrc: Oral Oral Oral   SpO2: 99% 95% 96%   Weight:      Height:        Wt Readings from Last 3 Encounters:  11/21/18 66.9 kg     Intake/Output Summary (Last 24 hours) at 11/24/2018 1401 Last data filed at 11/24/2018 1025 Gross per 24 hour  Intake 480 ml  Output 2350 ml    Net -1870 ml     Physical Exam Patient is examined daily including today on 11/24/18 , exams remain the same as of yesterday except that has changed   Gen:- Awake Alert,  In no apparent distress  HEENT:- [place].AT, No sclera icterus Ears- HOH Neck-Supple Neck,No JVD,.  Lungs-  CTAB , fair symmetrical air movement CV- S1, S2 normal, regular  Abd-  +ve B.Sounds, Abd Soft, No tenderness,    Extremity/Skin:- No  edema, pedal pulses present  Psych-affect is appropriate, oriented x3 Neuro-no new focal deficits, no tremors MSK--right hip area with appropriate postop tenderness, postop wound is intact  Data Review:   Micro Results Recent Results (from the past 240 hour(s))  MRSA PCR Screening     Status: None   Collection Time: 11/21/18 12:06 AM  Result Value Ref Range Status   MRSA by PCR NEGATIVE NEGATIVE Final    Comment:        The GeneXpert MRSA Assay (FDA approved for NASAL specimens only), is one component of a comprehensive MRSA colonization surveillance program. It is not intended to diagnose MRSA infection nor to guide or monitor treatment for MRSA infections. Performed at [facility], [place] [address], [place], [place] [address]     Radiology Reports Dg Chest 1 View  Result Date: 11/20/2018 CLINICAL DATA:  Status post fall with right hip fracture. EXAM: CHEST  1 VIEW COMPARISON:  Scout radiograph from a right shoulder CT which included the chest dated November 16, 2016. FINDINGS: The lungs are well-expanded. The interstitial markings are mildly prominent. The cardiac silhouette is enlarged. The pulmonary vascularity is mildly engorged with mild cephalization. There is no alveolar edema. There is no pleural effusion. There is calcification in the wall of the aortic arch. There are moderate degenerative changes of both shoulders. IMPRESSION: Low-grade CHF. Possible underlying chronic bronchitis. No acute pneumonia. Thoracic aortic atherosclerosis. Electronically Signed    By: David  Martinique M.D.   On: 11/20/2018 12:45   Dg Ankle Complete Right  Result Date: 11/20/2018 CLINICAL DATA:  Ankle pain.  Hip fracture. EXAM: RIGHT ANKLE - COMPLETE 3+ VIEW COMPARISON:  None. FINDINGS: Mild soft tissue swelling. Mild midfoot degenerative changes. No evidence of acute fracture or dislocation. IMPRESSION: Negative. Electronically Signed   By: Nelson Chimes M.D.   On: 11/20/2018 14:19   Ct Head Wo Contrast  Result Date: 11/20/2018 CLINICAL DATA:  Status post fall with back pain. EXAM: CT HEAD WITHOUT CONTRAST CT CERVICAL SPINE WITHOUT CONTRAST TECHNIQUE: Multidetector CT imaging of the head and cervical spine was performed following the standard protocol without intravenous contrast. Multiplanar CT image reconstructions of the cervical spine were also generated. COMPARISON:  Head CT  07/14/2017 FINDINGS: CT HEAD FINDINGS Brain: No evidence of acute infarction, hemorrhage, hydrocephalus, extra-axial collection or mass lesion/mass effect. Moderate brain parenchymal volume loss and deep white matter microangiopathy. Vascular: Minimal calcific atherosclerotic disease of the intra cavernous carotid arteries. Skull: Normal. Negative for fracture or focal lesion. Sinuses/Orbits: No acute finding. Other: Post bilateral cataract extraction. CT CERVICAL SPINE FINDINGS Alignment: Straightening of the cervical lordosis. Skull base and vertebrae: No acute fracture. No primary bone lesion or focal pathologic process. Soft tissues and spinal canal: No prevertebral fluid or swelling. No visible canal hematoma. Disc levels: Multilevel osteoarthritic changes with disc space narrowing, endplate sclerosis, remodeling of vertebral bodies and marked posterior facet arthropathy. Upper chest: Negative. Other: None. IMPRESSION: No acute intracranial abnormality. Atrophy, chronic microvascular disease. No evidence of acute traumatic injury to the cervical spine. Multilevel osteoarthritic changes of the cervical spine.  Osteopenia. Electronically Signed   By: Fidela Salisbury M.D.   On: 11/20/2018 13:11   Ct Cervical Spine Wo Contrast  Result Date: 11/20/2018 CLINICAL DATA:  Status post fall with back pain. EXAM: CT HEAD WITHOUT CONTRAST CT CERVICAL SPINE WITHOUT CONTRAST TECHNIQUE: Multidetector CT imaging of the head and cervical spine was performed following the standard protocol without intravenous contrast. Multiplanar CT image reconstructions of the cervical spine were also generated. COMPARISON:  Head CT 07/14/2017 FINDINGS: CT HEAD FINDINGS Brain: No evidence of acute infarction, hemorrhage, hydrocephalus, extra-axial collection or mass lesion/mass effect. Moderate brain parenchymal volume loss and deep white matter microangiopathy. Vascular: Minimal calcific atherosclerotic disease of the intra cavernous carotid arteries. Skull: Normal. Negative for fracture or focal lesion. Sinuses/Orbits: No acute finding. Other: Post bilateral cataract extraction. CT CERVICAL SPINE FINDINGS Alignment: Straightening of the cervical lordosis. Skull base and vertebrae: No acute fracture. No primary bone lesion or focal pathologic process. Soft tissues and spinal canal: No prevertebral fluid or swelling. No visible canal hematoma. Disc levels: Multilevel osteoarthritic changes with disc space narrowing, endplate sclerosis, remodeling of vertebral bodies and marked posterior facet arthropathy. Upper chest: Negative. Other: None. IMPRESSION: No acute intracranial abnormality. Atrophy, chronic microvascular disease. No evidence of acute traumatic injury to the cervical spine. Multilevel osteoarthritic changes of the cervical spine. Osteopenia. Electronically Signed   By: Fidela Salisbury M.D.   On: 11/20/2018 13:11   Dg C-arm 1-60 Min  Result Date: 11/21/2018 CLINICAL DATA:  Intramedullary nail EXAM: DG C-ARM 61-120 MIN; RIGHT FEMUR 2 VIEWS COMPARISON:  11/20/2018 FINDINGS: Multiple C-arm images show gamma nail placement for  fixation of an intertrochanteric fracture. Components appear well positioned. Distal locking screw in place. Good position and alignment. IMPRESSION: Good appearance following ORIF for treatment of right intertrochanteric fracture. Electronically Signed   By: Nelson Chimes M.D.   On: 11/21/2018 14:08   Dg Hip Unilat W Or Wo Pelvis 2-3 Views Right  Result Date: 11/20/2018 CLINICAL DATA:  Patient fell earlier today. The patient reports severe pelvic and right hip discomfort. EXAM: DG HIP (WITH OR WITHOUT PELVIS) 2-3V RIGHT COMPARISON:  Limited views of the pelvis and right hip from a lumbar spine series of December 31, 2016. FINDINGS: The bones are subjectively osteopenic. There is deformity of the inferior pubic ramus worrisome for an acute fracture. The left superior pubic ramus exhibits mild contour deformity in its midportion which could reflect an acute fracture is well. On the right the superior pubic ramus is intact while the inferior pubic ramus exhibits some cortical irregularity but no definite fracture. AP and lateral views of the right hip reveal a  comminuted angulated intertrochanteric fracture which extends into the subtrochanteric region. The femoral head and neck appear intact and the femoral head remains in reasonable association with the acetabulum. IMPRESSION: Acute comminuted angulated intertrochanteric-sub trochanteric fracture of the right hip. Abnormal appearance of the superior and inferior pubic rami on the left could reflect nondisplaced fractures. There is mild cortical irregularity of the right inferior pubic ramus without definite fracture. Electronically Signed   By: David  Martinique M.D.   On: 11/20/2018 12:42   Dg Femur, Min 2 Views Right  Result Date: 11/21/2018 CLINICAL DATA:  Intramedullary nail EXAM: DG C-ARM 61-120 MIN; RIGHT FEMUR 2 VIEWS COMPARISON:  11/20/2018 FINDINGS: Multiple C-arm images show gamma nail placement for fixation of an intertrochanteric fracture. Components  appear well positioned. Distal locking screw in place. Good position and alignment. IMPRESSION: Good appearance following ORIF for treatment of right intertrochanteric fracture. Electronically Signed   By: Nelson Chimes M.D.   On: 11/21/2018 14:08     CBC Recent Labs  Lab 11/20/18 1113 11/21/18 0526 11/22/18 0210 11/23/18 0304 11/24/18 0314  WBC 8.3 6.5 7.2 8.6 7.8  HGB 8.8* 8.6* 7.7* 6.9* 10.1*  HCT 31.2* 29.8* 26.3* 23.7* 32.3*  PLT 261 250 218 235 229  MCV 82.5 80.3 80.9 80.9 82.4  MCH 23.3* 23.2* 23.7* 23.5* 25.8*  MCHC 28.2* 28.9* 29.3* 29.1* 31.3  RDW 19.7* 19.7* 19.6* 19.9* 18.6*  LYMPHSABS 1.2  --   --   --   --   MONOABS 0.7  --   --   --   --   EOSABS 0.1  --   --   --   --   BASOSABS 0.0  --   --   --   --     Chemistries  Recent Labs  Lab 11/20/18 1113 11/21/18 0526 11/22/18 0210 11/24/18 0314  NA 138 135 137 142  K 4.0 4.6 4.4 4.6  CL 101 101 101 104  CO2 25 23 27 31   GLUCOSE 138* 115* 129* 115*  BUN 20 22 30* 32*  CREATININE 0.98 1.26* 1.26* 1.25*  CALCIUM 8.3* 8.0* 8.0* 8.2*     Lab Results  Component Value Date   HGBA1C 6.0 (H) 07/15/2017   ---------------------------------------------------------------------------------- Coagulation profile Recent Labs  Lab 11/20/18 1330  INR 1.10    Roxan Hockey M.D on 11/24/2018 at 2:01 PM  Pager---[phone] Go to [email or web address] - password [email or web address] for contact info  [facility] - Office  [phone]

## 2018-11-25 DIAGNOSIS — R52 Pain, unspecified: Secondary | ICD-10-CM

## 2018-11-25 DIAGNOSIS — I10 Essential (primary) hypertension: Secondary | ICD-10-CM

## 2018-11-25 DIAGNOSIS — D649 Anemia, unspecified: Secondary | ICD-10-CM

## 2018-11-25 DIAGNOSIS — S72091A Other fracture of head and neck of right femur, initial encounter for closed fracture: Secondary | ICD-10-CM

## 2018-11-25 DIAGNOSIS — S72001A Fracture of unspecified part of neck of right femur, initial encounter for closed fracture: Secondary | ICD-10-CM

## 2018-11-25 DIAGNOSIS — S3282XA Multiple fractures of pelvis without disruption of pelvic ring, initial encounter for closed fracture: Secondary | ICD-10-CM

## 2018-11-25 DIAGNOSIS — W19XXXA Unspecified fall, initial encounter: Secondary | ICD-10-CM

## 2018-11-25 MED ORDER — GABAPENTIN 100 MG PO CAPS: 100.0000 mg | ORAL_CAPSULE | Freq: Every evening | ORAL | 2 refills | Status: DC | PRN

## 2018-11-25 MED ORDER — METOPROLOL SUCCINATE ER 25 MG PO TB24: 25.0000 mg | ORAL_TABLET | Freq: Every day | ORAL | Status: AC

## 2018-11-25 MED ORDER — POLYETHYLENE GLYCOL 3350 17 G PO PACK: 17.0000 g | PACK | Freq: Every day | ORAL | 0 refills | Status: DC | PRN

## 2018-11-25 MED ORDER — DOCUSATE SODIUM 100 MG PO CAPS: 100.0000 mg | ORAL_CAPSULE | Freq: Two times a day (BID) | ORAL | 0 refills | Status: DC | PRN

## 2018-11-25 MED ORDER — DESMOPRESSIN ACETATE 0.1 MG PO TABS: 0.1000 mg | ORAL_TABLET | Freq: Every day | ORAL | 1 refills | Status: DC

## 2018-11-25 NOTE — Progress Notes (Signed)
CSW received notification from [facility] that patient has insurance authorization. CSW paged Dr. Lonny Prude attending to inquire of patient medically stable to discharge to [facility] today.   CSW awaiting decision.   [place], [place]

## 2018-11-25 NOTE — Progress Notes (Signed)
Patient will DC to: [place] Anticipated DC date: 11/25/18 Family notified: [name] Transport by: Corey Harold  Per MD patient ready for DC to [facility]. RN, patient, patient's family, and facility notified of DC. Discharge Summary sent to facility. RN given number for report [phone] Room 808P. DC packet on chart. Ambulance transport requested for patient.  CSW signing off.  [place], [place]

## 2018-11-25 NOTE — Plan of Care (Signed)
  Problem: Pain Managment: Goal: General experience of comfort will improve Outcome: Progressing   Problem: Safety: Goal: Ability to remain free from injury will improve Outcome: Progressing   

## 2018-11-25 NOTE — Plan of Care (Signed)
  Problem: Education: Goal: Knowledge of General Education information will improve Description Including pain rating scale, medication(s)/side effects and non-pharmacologic comfort measures Outcome: Progressing   

## 2018-11-25 NOTE — Clinical Social Work Placement (Signed)
   CLINICAL SOCIAL WORK PLACEMENT  NOTE  Date:  11/25/2018  Patient Details  Name: Vanessa Knox MRN: [id number] Date of Birth: 1916/12/15  Clinical Social Work is seeking post-discharge placement for this patient at the [place] level of care (*CSW will initial, date and re-position this form in  chart as items are completed):      Patient/family provided with [facility] Department's list of facilities offering this level of care within the geographic area requested by the patient (or if unable, by the patient's family).  Yes   Patient/family informed of their freedom to choose among providers that offer the needed level of care, that participate in Medicare, Medicaid or managed care program needed by the patient, have an available bed and are willing to accept the patient.      Patient/family informed of 's ownership interest in [facility] and [facility], as well as of the fact that they are under no obligation to receive care at these facilities.  PASRR submitted to EDS on       PASRR number received on 11/24/18     Existing PASRR number confirmed on       FL2 transmitted to all facilities in geographic area requested by pt/family on 11/24/18     FL2 transmitted to all facilities within larger geographic area on       Patient informed that his/her managed care company has contracts with or will negotiate with certain facilities, including the following:        Yes   Patient/family informed of bed offers received.  Patient chooses bed at [facility]     Physician recommends and patient chooses bed at      Patient to be transferred to [facility] on 11/25/18.  Patient to be transferred to facility by [facility]     Patient family notified on 11/25/18 of transfer.  Name of family member notified:  [name]     PHYSICIAN Please sign DNR     Additional Comment:    _______________________________________________ Alberteen Sam, LCSW 11/25/2018, 1:49 PM

## 2018-11-25 NOTE — Discharge Summary (Signed)
Discharge Summary  Vanessa Knox IRW:[id number] DOB: [birth year]  PCP: Leanna Battles, MD  Admit date: 11/20/2018 Discharge date: 11/25/2018   Time spent: < 25 minutes  Admitted From: Home Disposition:  SNF  Recommendations for Outpatient Follow-up:  1. Follow up with PCP in 1-2weeks, repeat BMP 2. Follow-up in the office with Dr. Alain Marion at 2 weeks postop. 3. Continue Lovenox per Dr. Percell Miller for a period of 4 weeks after discharge    Discharge Diagnoses:  Active Hospital Problems   Diagnosis Date Noted  . Comminuted fracture of hip, right, closed, initial encounter ([place]) 11/20/2018  . Multiple closed pelvic fractures without disruption of pelvic circle ([place]) 11/20/2018  . Closed right hip fracture, initial encounter ([place]) 11/20/2018  . HTN (hypertension) 07/14/2017  . Compression fracture of L5 lumbar vertebra, sequela 01/02/2017  . Spinal stenosis of lumbar region with neurogenic claudication 01/02/2017  . Chronic low back pain with left-sided sciatica 01/02/2017    Resolved Hospital Problems  No resolved problems to display.    Discharge Condition: Stable   CODE STATUS:DNR    Vitals:   11/25/18 0405 11/25/18 1020  BP: 130/64 (!) 143/82  Pulse: 88 82  Resp: 16 18  Temp: 98.2 F (36.8 C) 98 F (36.7 C)  SpO2: 98% 95%    History of present illness:  Vanessa Knox is a 84 y.o. year old female with medical history significant for HTN and spinal stenosis who presented on 11/20/2018 with with right hip pain after mechanical fall and was found to have comminuted fracture of right hip. Remaining hospital course addressed in problem based format below:   Hospital Course:   Acute right hip intertrochanteric fracture.  Status post intramedullary nailing on 76/1 without complications.  We will continue chemo prophylaxis with Lovenox for 4 weeks post discharge.  She will need repeat BMP 1 week after discharge( as current GFR ranging 35-40 while on Lovenox) Dr. Percell Miller will  arrange follow-up in 2 weeks and will address postoperative dressing.  Patient discharged to SNF to continue physical therapy with weightbearing as tolerated to right lower extremity. Short prescription for Hydrocodone acetaminophen provided by orthopedics, continue bowel regimen, and incentive spirometry.  Acute blood loss anemia.  Secondary to surgery for hip fracture.  Hemoglobin down to 6.9 from previous baseline of 8.  Received 2 units of packed red blood cells on 12/9 with improvement of hemoglobin 10.1 on discharge.  Nondisplaced pubic fractures. No surgical intervention needed. Surgical team recommended weight bearing as tolerated  Hypertension, remained stable on home regimen of Toprol which she will continue on discharge.   Consultations:  Orthopedics  Procedures/Studies: 12/7, intramedullary nailing of right hip (orthopedic)  Discharge Exam: BP (!) 143/82 (BP Location: Left Arm)   Pulse 82   Temp 98 F (36.7 C) (Oral)   Resp 18   Ht 5\' 5"  (1.651 m)   Wt 66.9 kg   SpO2 95%   BMI 24.54 kg/m   General: Lying in bed, no apparent distress Eyes: EOMI, anicteric ENT: Oral Mucosa clear and moist Cardiovascular: regular rate and rhythm, no murmurs, rubs or gallops, no edema, Respiratory: Normal respiratory effort Abdomen: soft, non-distended, non-tender, normal bowel sounds Skin: No Rash MSK: Postoperative dressing in place on right leg (lateral side, clean dry and intact). Neurologic: Grossly no focal neuro deficit.Mental status AAOx3, speech normal, Psychiatric:Appropriate affect, and mood   Discharge Instructions You were cared for by a hospitalist during your hospital stay. If you have any questions about your discharge medications  or the care you received while you were in the hospital after you are discharged, you can call the unit and asked to speak with the hospitalist on call if the hospitalist that took care of you is not available. Once you are discharged, your  primary care physician will handle any further medical issues. Please note that NO REFILLS for any discharge medications will be authorized once you are discharged, as it is imperative that you return to your primary care physician (or establish a relationship with a primary care physician if you do not have one) for your aftercare needs so that they can reassess your need for medications and monitor your lab values.  Discharge Instructions    Diet - low sodium heart healthy   Complete by:  As directed    Increase activity slowly   Complete by:  As directed      Allergies as of 11/25/2018      Reactions   Keflex [cephalexin] Diarrhea, Other (See Comments)   Headaches and dizziness also      Medication List    STOP taking these medications   aspirin 325 MG tablet     TAKE these medications   desmopressin 0.1 MG tablet Commonly known as:  DDAVP Take 1 tablet (0.1 mg total) by mouth at bedtime. To reduce urination   docusate sodium 100 MG capsule Commonly known as:  COLACE Take 1 capsule (100 mg total) by mouth 2 (two) times daily as needed for mild constipation.   enoxaparin 30 MG/0.3ML injection Commonly known as:  LOVENOX Inject 0.3 mLs (30 mg total) into the skin daily.   gabapentin 100 MG capsule Commonly known as:  NEURONTIN Take 1 capsule (100 mg total) by mouth at bedtime as needed. What changed:  reasons to take this   HYDROcodone-acetaminophen 5-325 MG tablet Commonly known as:  NORCO/VICODIN Take 1 tablet by mouth every 4 (four) hours as needed for moderate pain.   metoprolol succinate 25 MG 24 hr tablet Commonly known as:  TOPROL-XL Take 1 tablet (25 mg total) by mouth daily.   polyethylene glycol packet Commonly known as:  MIRALAX / GLYCOLAX Take 17 g by mouth daily as needed for mild constipation.      Allergies  Allergen Reactions  . Keflex [Cephalexin] Diarrhea and Other (See Comments)    Headaches and dizziness also     Contact information for  follow-up providers    Renette Butters, MD Follow up in 2 week(s).   Specialty:  Orthopedic Surgery Contact information: [address] [place] [address] [phone]            Contact information for after-discharge care    Destination    [facility] .   Service:  Skilled Nursing Contact information: [place] [phone]                   The results of significant diagnostics from this hospitalization (including imaging, microbiology, ancillary and laboratory) are listed below for reference.    Significant Diagnostic Studies: Dg Chest 1 View  Result Date: 11/20/2018 CLINICAL DATA:  Status post fall with right hip fracture. EXAM: CHEST  1 VIEW COMPARISON:  Scout radiograph from a right shoulder CT which included the chest dated November 16, 2016. FINDINGS: The lungs are well-expanded. The interstitial markings are mildly prominent. The cardiac silhouette is enlarged. The pulmonary vascularity is mildly engorged with mild cephalization. There is no alveolar  edema. There is no pleural effusion. There is calcification in the wall of the aortic arch. There are moderate degenerative changes of both shoulders. IMPRESSION: Low-grade CHF. Possible underlying chronic bronchitis. No acute pneumonia. Thoracic aortic atherosclerosis. Electronically Signed   By: David  Martinique M.D.   On: 11/20/2018 12:45   Dg Ankle Complete Right  Result Date: 11/20/2018 CLINICAL DATA:  Ankle pain.  Hip fracture. EXAM: RIGHT ANKLE - COMPLETE 3+ VIEW COMPARISON:  None. FINDINGS: Mild soft tissue swelling. Mild midfoot degenerative changes. No evidence of acute fracture or dislocation. IMPRESSION: Negative. Electronically Signed   By: Nelson Chimes M.D.   On: 11/20/2018 14:19   Ct Head Wo Contrast  Result Date: 11/20/2018 CLINICAL DATA:  Status post fall with back pain. EXAM: CT HEAD WITHOUT CONTRAST CT CERVICAL SPINE  WITHOUT CONTRAST TECHNIQUE: Multidetector CT imaging of the head and cervical spine was performed following the standard protocol without intravenous contrast. Multiplanar CT image reconstructions of the cervical spine were also generated. COMPARISON:  Head CT 07/14/2017 FINDINGS: CT HEAD FINDINGS Brain: No evidence of acute infarction, hemorrhage, hydrocephalus, extra-axial collection or mass lesion/mass effect. Moderate brain parenchymal volume loss and deep white matter microangiopathy. Vascular: Minimal calcific atherosclerotic disease of the intra cavernous carotid arteries. Skull: Normal. Negative for fracture or focal lesion. Sinuses/Orbits: No acute finding. Other: Post bilateral cataract extraction. CT CERVICAL SPINE FINDINGS Alignment: Straightening of the cervical lordosis. Skull base and vertebrae: No acute fracture. No primary bone lesion or focal pathologic process. Soft tissues and spinal canal: No prevertebral fluid or swelling. No visible canal hematoma. Disc levels: Multilevel osteoarthritic changes with disc space narrowing, endplate sclerosis, remodeling of vertebral bodies and marked posterior facet arthropathy. Upper chest: Negative. Other: None. IMPRESSION: No acute intracranial abnormality. Atrophy, chronic microvascular disease. No evidence of acute traumatic injury to the cervical spine. Multilevel osteoarthritic changes of the cervical spine. Osteopenia. Electronically Signed   By: Fidela Salisbury M.D.   On: 11/20/2018 13:11   Ct Cervical Spine Wo Contrast  Result Date: 11/20/2018 CLINICAL DATA:  Status post fall with back pain. EXAM: CT HEAD WITHOUT CONTRAST CT CERVICAL SPINE WITHOUT CONTRAST TECHNIQUE: Multidetector CT imaging of the head and cervical spine was performed following the standard protocol without intravenous contrast. Multiplanar CT image reconstructions of the cervical spine were also generated. COMPARISON:  Head CT 07/14/2017 FINDINGS: CT HEAD FINDINGS Brain: No  evidence of acute infarction, hemorrhage, hydrocephalus, extra-axial collection or mass lesion/mass effect. Moderate brain parenchymal volume loss and deep white matter microangiopathy. Vascular: Minimal calcific atherosclerotic disease of the intra cavernous carotid arteries. Skull: Normal. Negative for fracture or focal lesion. Sinuses/Orbits: No acute finding. Other: Post bilateral cataract extraction. CT CERVICAL SPINE FINDINGS Alignment: Straightening of the cervical lordosis. Skull base and vertebrae: No acute fracture. No primary bone lesion or focal pathologic process. Soft tissues and spinal canal: No prevertebral fluid or swelling. No visible canal hematoma. Disc levels: Multilevel osteoarthritic changes with disc space narrowing, endplate sclerosis, remodeling of vertebral bodies and marked posterior facet arthropathy. Upper chest: Negative. Other: None. IMPRESSION: No acute intracranial abnormality. Atrophy, chronic microvascular disease. No evidence of acute traumatic injury to the cervical spine. Multilevel osteoarthritic changes of the cervical spine. Osteopenia. Electronically Signed   By: Fidela Salisbury M.D.   On: 11/20/2018 13:11   Dg C-arm 1-60 Min  Result Date: 11/21/2018 CLINICAL DATA:  Intramedullary nail EXAM: DG C-ARM 61-120 MIN; RIGHT FEMUR 2 VIEWS COMPARISON:  11/20/2018 FINDINGS: Multiple C-arm images show gamma nail placement for  fixation of an intertrochanteric fracture. Components appear well positioned. Distal locking screw in place. Good position and alignment. IMPRESSION: Good appearance following ORIF for treatment of right intertrochanteric fracture. Electronically Signed   By: Nelson Chimes M.D.   On: 11/21/2018 14:08   Dg Hip Unilat W Or Wo Pelvis 2-3 Views Right  Result Date: 11/20/2018 CLINICAL DATA:  Patient fell earlier today. The patient reports severe pelvic and right hip discomfort. EXAM: DG HIP (WITH OR WITHOUT PELVIS) 2-3V RIGHT COMPARISON:  Limited views of  the pelvis and right hip from a lumbar spine series of December 31, 2016. FINDINGS: The bones are subjectively osteopenic. There is deformity of the inferior pubic ramus worrisome for an acute fracture. The left superior pubic ramus exhibits mild contour deformity in its midportion which could reflect an acute fracture is well. On the right the superior pubic ramus is intact while the inferior pubic ramus exhibits some cortical irregularity but no definite fracture. AP and lateral views of the right hip reveal a comminuted angulated intertrochanteric fracture which extends into the subtrochanteric region. The femoral head and neck appear intact and the femoral head remains in reasonable association with the acetabulum. IMPRESSION: Acute comminuted angulated intertrochanteric-sub trochanteric fracture of the right hip. Abnormal appearance of the superior and inferior pubic rami on the left could reflect nondisplaced fractures. There is mild cortical irregularity of the right inferior pubic ramus without definite fracture. Electronically Signed   By: David  Martinique M.D.   On: 11/20/2018 12:42   Dg Femur, Min 2 Views Right  Result Date: 11/21/2018 CLINICAL DATA:  Intramedullary nail EXAM: DG C-ARM 61-120 MIN; RIGHT FEMUR 2 VIEWS COMPARISON:  11/20/2018 FINDINGS: Multiple C-arm images show gamma nail placement for fixation of an intertrochanteric fracture. Components appear well positioned. Distal locking screw in place. Good position and alignment. IMPRESSION: Good appearance following ORIF for treatment of right intertrochanteric fracture. Electronically Signed   By: Nelson Chimes M.D.   On: 11/21/2018 14:08    Microbiology: Recent Results (from the past 240 hour(s))  MRSA PCR Screening     Status: None   Collection Time: 11/21/18 12:06 AM  Result Value Ref Range Status   MRSA by PCR NEGATIVE NEGATIVE Final    Comment:        The GeneXpert MRSA Assay (FDA approved for NASAL specimens only), is one  component of a comprehensive MRSA colonization surveillance program. It is not intended to diagnose MRSA infection nor to guide or monitor treatment for MRSA infections. Performed at [facility], [place] [address], [place], [place] [address]      Labs: Basic Metabolic Panel: Recent Labs  Lab 11/20/18 1113 11/21/18 0526 11/22/18 0210 11/24/18 0314  NA 138 135 137 142  K 4.0 4.6 4.4 4.6  CL 101 101 101 104  CO2 25 23 27 31   GLUCOSE 138* 115* 129* 115*  BUN 20 22 30* 32*  CREATININE 0.98 1.26* 1.26* 1.25*  CALCIUM 8.3* 8.0* 8.0* 8.2*   Liver Function Tests: No results for input(s): AST, ALT, ALKPHOS, BILITOT, PROT, ALBUMIN in the last 168 hours. No results for input(s): LIPASE, AMYLASE in the last 168 hours. No results for input(s): AMMONIA in the last 168 hours. CBC: Recent Labs  Lab 11/20/18 1113 11/21/18 0526 11/22/18 0210 11/23/18 0304 11/24/18 0314  WBC 8.3 6.5 7.2 8.6 7.8  NEUTROABS 6.3  --   --   --   --   HGB 8.8* 8.6* 7.7* 6.9* 10.1*  HCT 31.2*  29.8* 26.3* 23.7* 32.3*  MCV 82.5 80.3 80.9 80.9 82.4  PLT 261 250 218 235 229   Cardiac Enzymes: No results for input(s): CKTOTAL, CKMB, CKMBINDEX, TROPONINI in the last 168 hours. BNP: BNP (last 3 results) No results for input(s): BNP in the last 8760 hours.  ProBNP (last 3 results) No results for input(s): PROBNP in the last 8760 hours.  CBG: No results for input(s): GLUCAP in the last 168 hours.     Signed:  Desiree Hane, MD [facility] 11/25/2018, 1:38 PM

## 2018-11-25 NOTE — Progress Notes (Signed)
Called report to nurse Natasha Bence at [facility]. Also notified pt's son [name] that Vanessa Knox has been called and pt awaiting transfer. Removed x 2 peripheral IV's from L arm and pt  Tolerated well. Incontinence care performed and pt ready for discharge.

## 2019-08-10 ENCOUNTER — Emergency Department (HOSPITAL_COMMUNITY): Payer: Medicare Other

## 2019-08-10 ENCOUNTER — Encounter (HOSPITAL_COMMUNITY): Payer: Self-pay | Admitting: Emergency Medicine

## 2019-08-10 ENCOUNTER — Other Ambulatory Visit: Payer: Self-pay

## 2019-08-10 ENCOUNTER — Inpatient Hospital Stay (HOSPITAL_COMMUNITY)
Admission: EM | Admit: 2019-08-10 | Discharge: 2019-08-17 | DRG: 481 | Disposition: A | Discharge status: Skilled Nursing Facility | Payer: Medicare Other | Attending: Internal Medicine | Admitting: Internal Medicine

## 2019-08-10 DIAGNOSIS — S72454B Nondisplaced supracondylar fracture without intracondylar extension of lower end of right femur, initial encounter for open fracture type I or II: Secondary | ICD-10-CM

## 2019-08-10 DIAGNOSIS — I10 Essential (primary) hypertension: Secondary | ICD-10-CM | POA: Diagnosis present

## 2019-08-10 DIAGNOSIS — M4800 Spinal stenosis, site unspecified: Secondary | ICD-10-CM | POA: Diagnosis present

## 2019-08-10 DIAGNOSIS — Z66 Do not resuscitate: Secondary | ICD-10-CM | POA: Diagnosis present

## 2019-08-10 DIAGNOSIS — M25551 Pain in right hip: Secondary | ICD-10-CM | POA: Diagnosis present

## 2019-08-10 DIAGNOSIS — W010XXA Fall on same level from slipping, tripping and stumbling without subsequent striking against object, initial encounter: Secondary | ICD-10-CM | POA: Diagnosis present

## 2019-08-10 DIAGNOSIS — S0101XA Laceration without foreign body of scalp, initial encounter: Secondary | ICD-10-CM | POA: Diagnosis present

## 2019-08-10 DIAGNOSIS — S7290XA Unspecified fracture of unspecified femur, initial encounter for closed fracture: Secondary | ICD-10-CM

## 2019-08-10 DIAGNOSIS — S81011A Laceration without foreign body, right knee, initial encounter: Secondary | ICD-10-CM

## 2019-08-10 DIAGNOSIS — S72451A Displaced supracondylar fracture without intracondylar extension of lower end of right femur, initial encounter for closed fracture: Secondary | ICD-10-CM | POA: Diagnosis present

## 2019-08-10 DIAGNOSIS — D62 Acute posthemorrhagic anemia: Secondary | ICD-10-CM | POA: Diagnosis not present

## 2019-08-10 DIAGNOSIS — S72451B Displaced supracondylar fracture without intracondylar extension of lower end of right femur, initial encounter for open fracture type I or II: Principal | ICD-10-CM

## 2019-08-10 DIAGNOSIS — M9711XA Periprosthetic fracture around internal prosthetic right knee joint, initial encounter: Secondary | ICD-10-CM | POA: Diagnosis present

## 2019-08-10 DIAGNOSIS — Z20828 Contact with and (suspected) exposure to other viral communicable diseases: Secondary | ICD-10-CM | POA: Diagnosis present

## 2019-08-10 DIAGNOSIS — Z23 Encounter for immunization: Secondary | ICD-10-CM

## 2019-08-10 DIAGNOSIS — M5430 Sciatica, unspecified side: Secondary | ICD-10-CM | POA: Diagnosis present

## 2019-08-10 DIAGNOSIS — D638 Anemia in other chronic diseases classified elsewhere: Secondary | ICD-10-CM | POA: Diagnosis present

## 2019-08-10 DIAGNOSIS — W19XXXA Unspecified fall, initial encounter: Secondary | ICD-10-CM

## 2019-08-10 DIAGNOSIS — W228XXA Striking against or struck by other objects, initial encounter: Secondary | ICD-10-CM | POA: Diagnosis present

## 2019-08-10 DIAGNOSIS — Z8249 Family history of ischemic heart disease and other diseases of the circulatory system: Secondary | ICD-10-CM | POA: Diagnosis not present

## 2019-08-10 DIAGNOSIS — Z419 Encounter for procedure for purposes other than remedying health state, unspecified: Secondary | ICD-10-CM

## 2019-08-10 DIAGNOSIS — Z881 Allergy status to other antibiotic agents status: Secondary | ICD-10-CM | POA: Diagnosis not present

## 2019-08-10 DIAGNOSIS — Z96649 Presence of unspecified artificial hip joint: Secondary | ICD-10-CM

## 2019-08-10 DIAGNOSIS — M978XXA Periprosthetic fracture around other internal prosthetic joint, initial encounter: Secondary | ICD-10-CM

## 2019-08-10 LAB — CBC WITH DIFFERENTIAL/PLATELET
Abs Immature Granulocytes: 0.05 10*3/uL (ref 0.00–0.07)
Basophils Absolute: 0.1 10*3/uL (ref 0.0–0.1)
Basophils Relative: 0 %
Eosinophils Absolute: 0 10*3/uL (ref 0.0–0.5)
Eosinophils Relative: 0 %
HCT: 33.4 % — ABNORMAL LOW (ref 36.0–46.0)
Hemoglobin: 10.3 g/dL — ABNORMAL LOW (ref 12.0–15.0)
Immature Granulocytes: 0 %
Lymphocytes Relative: 8 %
Lymphs Abs: 1.2 10*3/uL (ref 0.7–4.0)
MCH: 29.4 pg (ref 26.0–34.0)
MCHC: 30.8 g/dL (ref 30.0–36.0)
MCV: 95.4 fL (ref 80.0–100.0)
Monocytes Absolute: 0.8 10*3/uL (ref 0.1–1.0)
Monocytes Relative: 5 %
Neutro Abs: 12.9 10*3/uL — ABNORMAL HIGH (ref 1.7–7.7)
Neutrophils Relative %: 87 %
Platelets: 248 10*3/uL (ref 150–400)
RBC: 3.5 MIL/uL — ABNORMAL LOW (ref 3.87–5.11)
RDW: 13.5 % (ref 11.5–15.5)
WBC: 14.9 10*3/uL — ABNORMAL HIGH (ref 4.0–10.5)
nRBC: 0 % (ref 0.0–0.2)

## 2019-08-10 LAB — COMPREHENSIVE METABOLIC PANEL
ALT: 13 U/L (ref 0–44)
AST: 17 U/L (ref 15–41)
Albumin: 3.3 g/dL — ABNORMAL LOW (ref 3.5–5.0)
Alkaline Phosphatase: 70 U/L (ref 38–126)
Anion gap: 9 (ref 5–15)
BUN: 23 mg/dL (ref 8–23)
CO2: 27 mmol/L (ref 22–32)
Calcium: 8.4 mg/dL — ABNORMAL LOW (ref 8.9–10.3)
Chloride: 105 mmol/L (ref 98–111)
Creatinine, Ser: 1.04 mg/dL — ABNORMAL HIGH (ref 0.44–1.00)
GFR calc Af Amer: 51 mL/min — ABNORMAL LOW (ref >60)
GFR calc non Af Amer: 44 mL/min — ABNORMAL LOW (ref >60)
Glucose, Bld: 165 mg/dL — ABNORMAL HIGH (ref 70–99)
Potassium: 3.9 mmol/L (ref 3.5–5.1)
Sodium: 141 mmol/L (ref 135–145)
Total Bilirubin: 0.7 mg/dL (ref 0.3–1.2)
Total Protein: 5.6 g/dL — ABNORMAL LOW (ref 6.5–8.1)

## 2019-08-10 LAB — BASIC METABOLIC PANEL

## 2019-08-10 LAB — TYPE AND SCREEN
ABO/RH(D): A POS
Antibody Screen: NEGATIVE

## 2019-08-10 LAB — CBG MONITORING, ED: Glucose-Capillary: 134 mg/dL — ABNORMAL HIGH (ref 70–99)

## 2019-08-10 LAB — PROTIME-INR
INR: 1.2 (ref 0.8–1.2)
Prothrombin Time: 15 seconds (ref 11.4–15.2)

## 2019-08-10 LAB — SARS CORONAVIRUS 2 (TAT 6-24 HRS): SARS Coronavirus 2: NEGATIVE

## 2019-08-10 MED ORDER — INSULIN ASPART 100 UNIT/ML ~~LOC~~ SOLN: 10.0000 [IU] | Freq: Once | SUBCUTANEOUS | Status: DC

## 2019-08-10 MED ORDER — DOCUSATE SODIUM 100 MG PO CAPS
100.0000 mg | ORAL_CAPSULE | Freq: Two times a day (BID) | ORAL | Status: DC
  Administered 2019-08-10 – 2019-08-17 (×14): 100 mg via ORAL
  Filled 2019-08-10 (×13): qty 1

## 2019-08-10 MED ORDER — SODIUM CHLORIDE 0.9 % IV BOLUS: 1000.0000 mL | Freq: Once | INTRAVENOUS | Status: DC

## 2019-08-10 MED ORDER — TETANUS-DIPHTH-ACELL PERTUSSIS 5-2.5-18.5 LF-MCG/0.5 IM SUSP
0.5000 mL | Freq: Once | INTRAMUSCULAR | Status: AC
  Administered 2019-08-10: 0.5 mL via INTRAMUSCULAR
  Filled 2019-08-10: qty 0.5

## 2019-08-10 MED ORDER — CLINDAMYCIN PHOSPHATE 900 MG/50ML IV SOLN
900.0000 mg | INTRAVENOUS | Status: AC
  Administered 2019-08-11: 900 mg via INTRAVENOUS
  Filled 2019-08-10 (×2): qty 50

## 2019-08-10 MED ORDER — POLYETHYLENE GLYCOL 3350 17 G PO PACK: 17.0000 g | PACK | Freq: Every day | ORAL | Status: DC | PRN

## 2019-08-10 MED ORDER — ACETAMINOPHEN 325 MG PO TABS
650.0000 mg | ORAL_TABLET | Freq: Four times a day (QID) | ORAL | Status: DC | PRN
  Administered 2019-08-10 – 2019-08-17 (×6): 650 mg via ORAL
  Filled 2019-08-10 (×5): qty 2

## 2019-08-10 MED ORDER — CEFAZOLIN SODIUM-DEXTROSE 1-4 GM/50ML-% IV SOLN
1.0000 g | Freq: Three times a day (TID) | INTRAVENOUS | Status: DC
  Administered 2019-08-10 – 2019-08-11 (×3): 1 g via INTRAVENOUS
  Filled 2019-08-10 (×3): qty 50

## 2019-08-10 MED ORDER — BISACODYL 5 MG PO TBEC: 5.0000 mg | DELAYED_RELEASE_TABLET | Freq: Every day | ORAL | Status: DC | PRN

## 2019-08-10 MED ORDER — METOPROLOL SUCCINATE ER 25 MG PO TB24
25.0000 mg | ORAL_TABLET | Freq: Every day | ORAL | Status: DC
  Administered 2019-08-10 – 2019-08-17 (×8): 25 mg via ORAL
  Filled 2019-08-10 (×8): qty 1

## 2019-08-10 MED ORDER — METHOCARBAMOL 500 MG PO TABS
500.0000 mg | ORAL_TABLET | Freq: Four times a day (QID) | ORAL | Status: DC | PRN
  Administered 2019-08-10 – 2019-08-16 (×6): 500 mg via ORAL
  Filled 2019-08-10 (×6): qty 1

## 2019-08-10 MED ORDER — FENTANYL CITRATE (PF) 100 MCG/2ML IJ SOLN
25.0000 ug | Freq: Once | INTRAMUSCULAR | Status: AC
  Administered 2019-08-10: 25 ug via INTRAVENOUS
  Filled 2019-08-10: qty 2

## 2019-08-10 MED ORDER — POVIDONE-IODINE 10 % EX SWAB: 2.0000 "application " | Freq: Once | CUTANEOUS | Status: DC

## 2019-08-10 MED ORDER — CLINDAMYCIN PHOSPHATE 600 MG/50ML IV SOLN
600.0000 mg | Freq: Once | INTRAVENOUS | Status: AC
  Administered 2019-08-10: 600 mg via INTRAVENOUS
  Filled 2019-08-10: qty 50

## 2019-08-10 MED ORDER — LIDOCAINE-EPINEPHRINE (PF) 2 %-1:200000 IJ SOLN
20.0000 mL | Freq: Once | INTRAMUSCULAR | Status: AC
  Administered 2019-08-10: 20 mL
  Filled 2019-08-10: qty 20

## 2019-08-10 MED ORDER — METHOCARBAMOL 1000 MG/10ML IJ SOLN: 500.0000 mg | Freq: Four times a day (QID) | INTRAVENOUS | Status: DC | PRN

## 2019-08-10 MED ORDER — ONDANSETRON HCL 4 MG/2ML IJ SOLN: 4.0000 mg | Freq: Four times a day (QID) | INTRAMUSCULAR | Status: DC | PRN

## 2019-08-10 MED ORDER — CHLORHEXIDINE GLUCONATE 4 % EX LIQD
60.0000 mL | Freq: Once | CUTANEOUS | Status: AC
  Administered 2019-08-11: 4 via TOPICAL

## 2019-08-10 MED ORDER — MORPHINE SULFATE (PF) 2 MG/ML IV SOLN
0.5000 mg | INTRAVENOUS | Status: DC | PRN
  Administered 2019-08-10: 0.5 mg via INTRAVENOUS
  Filled 2019-08-10: qty 1

## 2019-08-10 MED ORDER — HYDROCODONE-ACETAMINOPHEN 5-325 MG PO TABS
1.0000 | ORAL_TABLET | Freq: Four times a day (QID) | ORAL | Status: DC | PRN
  Administered 2019-08-10: 1 via ORAL
  Administered 2019-08-11 – 2019-08-13 (×5): 2 via ORAL
  Filled 2019-08-10 (×3): qty 2
  Filled 2019-08-10: qty 1
  Filled 2019-08-10 (×2): qty 2

## 2019-08-10 NOTE — ED Provider Notes (Signed)
[facility] EMERGENCY DEPARTMENT Provider Note   CSN: [id number] Arrival date & time: 08/10/19  0425     History   Chief Complaint Chief Complaint  Patient presents with   Fall    HPI Vanessa Knox is a 85 y.o. female.     Presents to the ED after a fall.  States she was trying to go to the bathroom with her walker when she lost her balance and the walker tipped over.  She struck her head on a metal door frame and fell to the ground.  She denies losing consciousness.  She also injured her right knee.  She lives at an assisted living facility and they contacted EMS.  She denies any preceding dizziness or lightheadedness.  She does not take any blood thinners.  She complains of pain to her head and right knee only.  She denies any weakness or numbness or tingling.  No chest pain or shortness of breath.  No abdominal pain.  She has had a both right hip and right knee replacement in the past.  The history is provided by the patient and the EMS personnel.  Fall Pertinent negatives include no chest pain, no abdominal pain, no headaches and no shortness of breath.    Past Medical History:  Diagnosis Date   HTN (hypertension)    Sciatica     Patient Active Problem List   Diagnosis Date Noted   Multiple closed pelvic fractures without disruption of pelvic circle ([place]) 11/20/2018   Comminuted fracture of hip, right, closed, initial encounter ([place]) 11/20/2018   Closed right hip fracture, initial encounter ([place]) 11/20/2018   TIA (transient ischemic attack) 07/14/2017   HTN (hypertension) 07/14/2017   Compression fracture of L5 lumbar vertebra, sequela 01/02/2017   Spinal stenosis of lumbar region with neurogenic claudication 01/02/2017   Chronic low back pain with left-sided sciatica 01/02/2017    Past Surgical History:  Procedure Laterality Date   FEMUR IM NAIL Right 11/21/2018   Procedure: INTRAMEDULLARY (IM) NAIL FEMORAL;  Surgeon: Renette Butters,  MD;  Location: [place];  Service: Orthopedics;  Laterality: Right;   ROTATOR CUFF REPAIR Right      OB History   No obstetric history on file.      Home Medications    Prior to Admission medications   Medication Sig Start Date End Date Taking? Authorizing Provider  desmopressin (DDAVP) 0.1 MG tablet Take 1 tablet (0.1 mg total) by mouth at bedtime. To reduce urination 11/25/18   Oretha Milch D, MD  docusate sodium (COLACE) 100 MG capsule Take 1 capsule (100 mg total) by mouth 2 (two) times daily as needed for mild constipation. 11/25/18   Oretha Milch D, MD  enoxaparin (LOVENOX) 30 MG/0.3ML injection Inject 0.3 mLs (30 mg total) into the skin daily. 11/21/18 12/21/18  Prudencio Burly III, PA-C  gabapentin (NEURONTIN) 100 MG capsule Take 1 capsule (100 mg total) by mouth at bedtime as needed. 11/25/18   Desiree Hane, MD  HYDROcodone-acetaminophen (NORCO) 5-325 MG tablet Take 1 tablet by mouth every 4 (four) hours as needed for moderate pain. 11/21/18   Prudencio Burly III, PA-C  metoprolol succinate (TOPROL-XL) 25 MG 24 hr tablet Take 1 tablet (25 mg total) by mouth daily. 11/25/18   Oretha Milch D, MD  polyethylene glycol (MIRALAX / GLYCOLAX) packet Take 17 g by mouth daily as needed for mild constipation. 11/25/18   Desiree Hane, MD    Family History Family History  Problem Relation Age of Onset   Heart failure Father     Social History Social History   Tobacco Use   Smoking status: Never Smoker   Smokeless tobacco: Never Used  Substance Use Topics   Alcohol use: No   Drug use: No     Allergies   Keflex [cephalexin]   Review of Systems Review of Systems  Constitutional: Negative for activity change, appetite change and fever.  HENT: Negative for congestion.   Respiratory: Negative for cough, chest tightness and shortness of breath.   Cardiovascular: Negative for chest pain.  Gastrointestinal: Negative for abdominal pain, nausea and  vomiting.  Genitourinary: Negative for dysuria and hematuria.  Musculoskeletal: Positive for arthralgias, back pain, myalgias and neck pain.  Skin: Positive for wound.  Neurological: Negative for dizziness, weakness and headaches.    all other systems are negative except as noted in the HPI and PMH.    Physical Exam Updated Vital Signs BP (!) 163/107    Pulse 86    Temp 98.3 F (36.8 C) (Oral)    Resp 17    SpO2 100%   Physical Exam Vitals signs and nursing note reviewed.  Constitutional:      General: She is not in acute distress.    Appearance: She is well-developed.  HENT:     Head: Normocephalic.     Comments: 5 cm vertical laceration to right frontal scalp Small laceration to L temple    Mouth/Throat:     Pharynx: No oropharyngeal exudate.  Eyes:     Conjunctiva/sclera: Conjunctivae normal.     Pupils: Pupils are equal, round, and reactive to light.  Neck:     Musculoskeletal: Normal range of motion and neck supple.     Comments: No C-spine tenderness Cardiovascular:     Rate and Rhythm: Normal rate and regular rhythm.     Heart sounds: Normal heart sounds. No murmur.  Pulmonary:     Effort: Pulmonary effort is normal. No respiratory distress.     Breath sounds: Normal breath sounds.  Abdominal:     Palpations: Abdomen is soft.     Tenderness: There is no abdominal tenderness. There is no guarding or rebound.  Musculoskeletal: Normal range of motion.        General: Tenderness, deformity and signs of injury present.     Comments: No T or L-spine tenderness Pelvis stable  Gaping laceration to right anterior knee with skin flap.  Patient unable to flex or extend the knee.  Intact DP pulses bilaterally.  Skin:    General: Skin is warm.     Capillary Refill: Capillary refill takes less than 2 seconds.  Neurological:     General: No focal deficit present.     Mental Status: She is alert and oriented to person, place, and time. Mental status is at baseline.      Cranial Nerves: No cranial nerve deficit.     Motor: No abnormal muscle tone.     Coordination: Coordination normal.     Comments: No ataxia on finger to nose bilaterally. No pronator drift. 5/5 strength throughout. CN 2-12 intact.Equal grip strength. Sensation intact.   Psychiatric:        Behavior: Behavior normal.          ED Treatments / Results  Labs (all labs ordered are listed, but only abnormal results are displayed) Labs Reviewed  SARS CORONAVIRUS 2 (TAT 6-12 HRS)  CBC WITH DIFFERENTIAL/PLATELET  BASIC METABOLIC PANEL  TYPE AND SCREEN  EKG EKG Interpretation  Date/Time:  Tuesday August 10 2019 04:38:01 EDT Ventricular Rate:  85 PR Interval:    QRS Duration: 92 QT Interval:  372 QTC Calculation: 443 R Axis:   63 Text Interpretation:  Sinus rhythm Multiple ventricular premature complexes No significant change was found Confirmed by Ezequiel Essex [phone]) on 08/10/2019 4:58:12 AM   Radiology Ct Head Wo Contrast  Result Date: 08/10/2019 CLINICAL DATA:  Fall with head trauma EXAM: CT HEAD WITHOUT CONTRAST CT CERVICAL SPINE WITHOUT CONTRAST TECHNIQUE: Multidetector CT imaging of the head and cervical spine was performed following the standard protocol without intravenous contrast. Multiplanar CT image reconstructions of the cervical spine were also generated. COMPARISON:  Brain MRI 07/15/2017 FINDINGS: CT HEAD FINDINGS Brain: No evidence of acute infarction, hemorrhage, hydrocephalus, extra-axial collection or mass lesion/mass effect. Age congruent volume loss and white matter appearance Vascular: No hyperdense vessel or unexpected calcification. Skull: High right scalp laceration without calvarial fracture. Sinuses/Orbits: No evidence of injury. Bilateral cataract resection. CT CERVICAL SPINE FINDINGS Alignment: No traumatic malalignment. Degenerative appearing C3-4, C4-5, and C7-T1 anterolisthesis. Skull base and vertebrae: Negative for acute fracture. Soft tissues and  spinal canal: No prevertebral fluid or swelling. No visible canal hematoma. Bilateral parotid calculi. Disc levels: Disc degeneration focally advanced at C5-6 and C6-7. Generalized degenerative facet spurring. Upper chest: Negative IMPRESSION: 1. No evidence of acute intracranial injury or cervical spine fracture. 2. Rights scalp laceration without calvarial fracture. Electronically Signed   By: Monte Fantasia M.D.   On: 08/10/2019 05:48   Ct Cervical Spine Wo Contrast  Result Date: 08/10/2019 CLINICAL DATA:  Fall with head trauma EXAM: CT HEAD WITHOUT CONTRAST CT CERVICAL SPINE WITHOUT CONTRAST TECHNIQUE: Multidetector CT imaging of the head and cervical spine was performed following the standard protocol without intravenous contrast. Multiplanar CT image reconstructions of the cervical spine were also generated. COMPARISON:  Brain MRI 07/15/2017 FINDINGS: CT HEAD FINDINGS Brain: No evidence of acute infarction, hemorrhage, hydrocephalus, extra-axial collection or mass lesion/mass effect. Age congruent volume loss and white matter appearance Vascular: No hyperdense vessel or unexpected calcification. Skull: High right scalp laceration without calvarial fracture. Sinuses/Orbits: No evidence of injury. Bilateral cataract resection. CT CERVICAL SPINE FINDINGS Alignment: No traumatic malalignment. Degenerative appearing C3-4, C4-5, and C7-T1 anterolisthesis. Skull base and vertebrae: Negative for acute fracture. Soft tissues and spinal canal: No prevertebral fluid or swelling. No visible canal hematoma. Bilateral parotid calculi. Disc levels: Disc degeneration focally advanced at C5-6 and C6-7. Generalized degenerative facet spurring. Upper chest: Negative IMPRESSION: 1. No evidence of acute intracranial injury or cervical spine fracture. 2. Rights scalp laceration without calvarial fracture. Electronically Signed   By: Monte Fantasia M.D.   On: 08/10/2019 05:48   Ct Knee Right Wo Contrast  Result Date:  08/10/2019 CLINICAL DATA:  The trauma with tibial plateau fracture. EXAM: CT OF THE right KNEE WITHOUT CONTRAST TECHNIQUE: Multidetector CT imaging of the right knee was performed according to the standard protocol. Multiplanar CT image reconstructions were also generated. COMPARISON:  None. FINDINGS: Oblique fracture through the supracondylar femur, lateral and posterior eccentric, interposed between a femoral nail and the femoral component of a total knee arthroplasty. In addition to the dominant oblique fracture plane there is a small cortical fracture extending cranially through the posterior cortex of the distal femur, seem markings on coronal reformats. No displacement. No additional fracture noted. The distal interlocking screw of the femoral nail is fractured, likely on a chronic basis. Regional soft tissue swelling with small joint  effusion. Prominent osteopenia. IMPRESSION: Oblique supracondylar femur fracture interposed between a femoral nail and total knee arthroplasty. Electronically Signed   By: Monte Fantasia M.D.   On: 08/10/2019 05:54   Dg Pelvis Portable  Result Date: 08/10/2019 CLINICAL DATA:  Fall EXAM: PORTABLE PELVIS 1-2 VIEWS COMPARISON:  None. FINDINGS: Remote intertrochanteric right femur fracture with healing after fixation. Remote left obturator ring and right inferior pubic ramus fractures. No evidence of acute fracture. Osteopenia is profound. Lower lumbar degenerative disease with probable L5 vertebroplasty. IMPRESSION: 1. No acute finding. 2. Remote pelvic and right proximal femoral fractures. Electronically Signed   By: Monte Fantasia M.D.   On: 08/10/2019 06:01   Dg Chest Portable 1 View  Result Date: 08/10/2019 CLINICAL DATA:  Fall EXAM: PORTABLE CHEST 1 VIEW COMPARISON:  11/20/2018 FINDINGS: Normal heart size and stable mediastinal contours. Chronic mild interstitial coarsening. There is no edema, consolidation, effusion, or pneumothorax. Postoperative right axilla.  Prominent osteopenia and bilateral shoulder osteoarthritis. No evidence of acute fracture. IMPRESSION: Stable from prior.  No acute finding. Electronically Signed   By: Monte Fantasia M.D.   On: 08/10/2019 06:00    Procedures .Marland KitchenLaceration Repair  Date/Time: 08/10/2019 6:30 AM Performed by: Ezequiel Essex, MD Authorized by: Ezequiel Essex, MD   Consent:    Consent obtained:  Verbal   Consent given by:  Patient   Risks discussed:  Infection, need for additional repair, nerve damage, poor wound healing, poor cosmetic result, pain, tendon damage, retained foreign body and vascular damage   Alternatives discussed:  No treatment Anesthesia (see MAR for exact dosages):    Anesthesia method:  Local infiltration   Local anesthetic:  Lidocaine 1% WITH epi Laceration details:    Location:  Scalp   Scalp location:  Frontal   Length (cm):  8 Repair type:    Repair type:  Complex Pre-procedure details:    Preparation:  Patient was prepped and draped in usual sterile fashion and imaging obtained to evaluate for foreign bodies Exploration:    Limited defect created (wound extended): no     Hemostasis achieved with:  Epinephrine, direct pressure and tied off vessels   Wound extent: vascular damage   Treatment:    Area cleansed with:  Shur-Clens   Amount of cleaning:  Extensive   Irrigation solution:  Sterile saline   Irrigation method:  Pressure wash   Visualized foreign bodies/material removed: no   Subcutaneous repair:    Suture size:  4-0   Suture material:  Vicryl   Suture technique:  Figure eight   Number of sutures:  2 Skin repair:    Repair method:  Staples   Number of staples:  10 Approximation:    Approximation:  Close Post-procedure details:    Dressing:  Adhesive bandage and bulky dressing   Patient tolerance of procedure:  Tolerated well, no immediate complications  .Marland KitchenLaceration Repair  Date/Time: 08/10/2019 6:37 AM Performed by: Ezequiel Essex, MD Authorized by:  Ezequiel Essex, MD   Consent:    Consent obtained:  Verbal   Consent given by:  Patient   Risks discussed:  Need for additional repair, nerve damage, poor wound healing, poor cosmetic result, retained foreign body, tendon damage, vascular damage, pain and infection Anesthesia (see MAR for exact dosages):    Anesthesia method:  Local infiltration   Local anesthetic:  Lidocaine 1% w/o epi Laceration details:    Location:  Scalp   Scalp location:  L temporal   Length (cm):  2 Repair type:  Repair type:  Simple Pre-procedure details:    Preparation:  Patient was prepped and draped in usual sterile fashion and imaging obtained to evaluate for foreign bodies Exploration:    Hemostasis achieved with:  Epinephrine and direct pressure   Wound exploration: wound explored through full range of motion and entire depth of wound probed and visualized     Wound extent: no muscle damage noted, no underlying fracture noted and no vascular damage noted   Treatment:    Area cleansed with:  Shur-Clens   Amount of cleaning:  Standard   Irrigation solution:  Sterile saline   Irrigation method:  Pressure wash Skin repair:    Repair method:  Staples   Number of staples:  4 Approximation:    Approximation:  Close Post-procedure details:    Dressing:  Adhesive bandage and bulky dressing   Patient tolerance of procedure:  Tolerated well, no immediate complications   (including critical care time)  Medications Ordered in ED Medications  fentaNYL (SUBLIMAZE) injection 25 mcg (has no administration in time range)  Tdap (BOOSTRIX) injection 0.5 mL (has no administration in time range)     Initial Impression / Assessment and Plan / ED Course  I have reviewed the triage vital signs and the nursing notes.  Pertinent labs & imaging results that were available during my care of the patient were reviewed by me and considered in my medical decision making (see chart for details).        Patient  from living facility after a fall for losing her balance.  She sustained a head injury with laceration as well as right knee injury with laceration.  Denies any preceding dizziness or lightheadedness.  Does not take any blood thinners.  GCS is 15.  ABCs are intact.  She has extensive wound to her right scalp as well as right knee as above.  Tetanus is updated.  Antibiotics given for suspected complicated knee laceration with possible joint involvement.  CT head and C-spine are negative.  Lacerations repaired as above with assistance of PA-C Browning.  Tetanus updated.  Supracondylar fracture noted of right leg between IM femoral nail and total knee arthroplasty. Discussed with Dr. Percell Miller of orthopedics who will plan for evaluation likely or later today.  Patient given IV antibiotics.  Tetanus updated.  She remains hemodynamically stable.  CT head and C-spine are negative.  Patient to be admitted by medicine.  Discussed with Dr. Myna Hidalgo and Dr. Lorin Mercy.  Patient to go to OR with orthopedics later today.  CRITICAL CARE Performed by: Ezequiel Essex Total critical care time: 35 minutes Critical care time was exclusive of separately billable procedures and treating other patients. Critical care was necessary to treat or prevent imminent or life-threatening deterioration. Critical care was time spent personally by me on the following activities: development of treatment plan with patient and/or surrogate as well as nursing, discussions with consultants, evaluation of patient's response to treatment, examination of patient, obtaining history from patient or surrogate, ordering and performing treatments and interventions, ordering and review of laboratory studies, ordering and review of radiographic studies, pulse oximetry and re-evaluation of patient's condition.  Vanessa Knox was evaluated in Emergency Department on 08/10/2019 for the symptoms described in the history of present illness. She was evaluated  in the context of the global COVID-19 pandemic, which necessitated consideration that the patient might be at risk for infection with the SARS-CoV-2 virus that causes COVID-19. Institutional protocols and algorithms that pertain to the evaluation of patients at  risk for COVID-19 are in a state of rapid change based on information released by regulatory bodies including the CDC and federal and state organizations. These policies and algorithms were followed during the patient's care in the ED.   Final Clinical Impressions(s) / ED Diagnoses   Final diagnoses:  Fall, initial encounter  Laceration of scalp, initial encounter  Type I or II open nondisplaced supracondylar fracture of right femur without intracondylar extension, initial encounter [facility])    ED Discharge Orders    None       Ezequiel Essex, MD 08/10/19 [phone]

## 2019-08-10 NOTE — H&P (Signed)
History and Physical    Vanessa Knox ZQ:[id number] DOB: June 30, 1916 DOA: 08/10/2019  PCP: Leanna Battles, MD Consultants:  Percell Miller - orthopedics Patient coming from: [place]; NOK: Melida Gimenez [place], [phone]  Chief Complaint: fall  HPI: Vanessa Knox is a 85 y.o. female with medical history significant of HTN and spinal stenosis presenting with a mechanical fall.  She also fell in Dec, resulting in a R intertrochanteric hip fracture.  She was discharged to [facility] for rehab and returned back home again.  Today, she got up to use the bathroom and slipped, causing her walker to pull her down.  She had a head laceration as well as R knee injury.  No LOC.  Felt well prior to the fall.   ED Course:  Carryover, per Dr. Myna Hidalgo:  16 yof with hx of HTN and spinal stenosis presents after a fall and is found to have supracondylar femur fracture. Ortho (Dr. Percell Miller) planning for OR this morning. Labs pending.   Review of Systems: As per HPI; otherwise review of systems reviewed and negative.   Ambulatory Status:  Ambulates with a walker  Past Medical History:  Diagnosis Date  . HTN (hypertension)   . Sciatica     Past Surgical History:  Procedure Laterality Date  . FEMUR IM NAIL Right 11/21/2018   Procedure: INTRAMEDULLARY (IM) NAIL FEMORAL;  Surgeon: Renette Butters, MD;  Location: [facility];  Service: Orthopedics;  Laterality: Right;  . ROTATOR CUFF REPAIR Right     Social History   Socioeconomic History  . Marital status: Widowed    Spouse name: Not on file  . Number of children: Not on file  . Years of education: Not on file  . Highest education level: Not on file  Occupational History  . Not on file  Social Needs  . Financial resource strain: Not on file  . Food insecurity    Worry: Not on file    Inability: Not on file  . Transportation needs    Medical: Not on file    Non-medical: Not on file  Tobacco Use  . Smoking status: Never Smoker  .  Smokeless tobacco: Never Used  Substance and Sexual Activity  . Alcohol use: No  . Drug use: No  . Sexual activity: Not on file  Lifestyle  . Physical activity    Days per week: Not on file    Minutes per session: Not on file  . Stress: Not on file  Relationships  . Social Herbalist on phone: Not on file    Gets together: Not on file    Attends religious service: Not on file    Active member of club or organization: Not on file    Attends meetings of clubs or organizations: Not on file    Relationship status: Not on file  . Intimate partner violence    Fear of current or ex partner: Not on file    Emotionally abused: Not on file    Physically abused: Not on file    Forced sexual activity: Not on file  Other Topics Concern  . Not on file  Social History Narrative  . Not on file    Allergies  Allergen Reactions  . Keflex [Cephalexin] Diarrhea and Other (See Comments)    Headaches and dizziness also     Family History  Problem Relation Age of Onset  . Heart failure Father     Prior to Admission medications  Medication Sig Start Date End Date Taking? Authorizing Provider  aspirin 325 MG tablet Take 325 mg by mouth daily.   Yes [provider]  metoprolol succinate (TOPROL-XL) 25 MG 24 hr tablet Take 1 tablet (25 mg total) by mouth daily. 11/25/18  Yes Oretha Milch D, MD  desmopressin (DDAVP) 0.1 MG tablet Take 1 tablet (0.1 mg total) by mouth at bedtime. To reduce urination Patient not taking: Reported on 08/10/2019 11/25/18   Oretha Milch D, MD  docusate sodium (COLACE) 100 MG capsule Take 1 capsule (100 mg total) by mouth 2 (two) times daily as needed for mild constipation. Patient not taking: Reported on 08/10/2019 11/25/18   Oretha Milch D, MD  enoxaparin (LOVENOX) 30 MG/0.3ML injection Inject 0.3 mLs (30 mg total) into the skin daily. Patient not taking: Reported on 08/10/2019 11/21/18 12/21/18  Prudencio Burly III, PA-C  gabapentin  (NEURONTIN) 100 MG capsule Take 1 capsule (100 mg total) by mouth at bedtime as needed. Patient not taking: Reported on 08/10/2019 11/25/18   Desiree Hane, MD  HYDROcodone-acetaminophen (NORCO) 5-325 MG tablet Take 1 tablet by mouth every 4 (four) hours as needed for moderate pain. Patient not taking: Reported on 08/10/2019 11/21/18   Prudencio Burly III, PA-C  polyethylene glycol [facility] Floria Raveling) packet Take 17 g by mouth daily as needed for mild constipation. Patient not taking: Reported on 08/10/2019 11/25/18   Desiree Hane, MD    Physical Exam: Vitals:   08/10/19 0438 08/10/19 0439 08/10/19 0448 08/10/19 0700  BP: (!) 163/107   (!) 162/48  Pulse:  86  74  Resp: 16 17  (!) 23  Temp:   98.3 F (36.8 C)   TempSrc:   Oral   SpO2:  100%  97%     . General:  Appears calm and comfortable and is NAD; she has numerous bandages from her injuries and blood stains on her person and the sheets . Eyes:  PERRL, EOMI, normal lids, iris . ENT:  grossly normal hearing, lips & tongue, mmm; appropriate dentition . Neck:  no LAD, masses or thyromegaly . Cardiovascular:  RRR, no m/r/g. No LE edema.  Marland Kitchen Respiratory:   CTA bilaterally with no wheezes/rales/rhonchi.  Normal respiratory effort. . Abdomen:  soft, NT, ND, NABS . Skin:  Complicated knee laceration on the R as well as scalp laceration         . Musculoskeletal:  R leg with shortening and external rotation . Lower extremity:  No LE edema.  Limited foot exam with no ulcerations.  2+ distal pulses. Marland Kitchen Psychiatric:  grossly normal mood and affect, speech fluent and appropriate, AOx3 . Neurologic:  CN 2-12 grossly intact, moves all extremities in coordinated fashion (sparing RLE due to pain), sensation intact    Radiological Exams on Admission: Ct Head Wo Contrast  Result Date: 08/10/2019 CLINICAL DATA:  Fall with head trauma EXAM: CT HEAD WITHOUT CONTRAST CT CERVICAL SPINE WITHOUT CONTRAST TECHNIQUE: Multidetector CT  imaging of the head and cervical spine was performed following the standard protocol without intravenous contrast. Multiplanar CT image reconstructions of the cervical spine were also generated. COMPARISON:  Brain MRI 07/15/2017 FINDINGS: CT HEAD FINDINGS Brain: No evidence of acute infarction, hemorrhage, hydrocephalus, extra-axial collection or mass lesion/mass effect. Age congruent volume loss and white matter appearance Vascular: No hyperdense vessel or unexpected calcification. Skull: High right scalp laceration without calvarial fracture. Sinuses/Orbits: No evidence of injury. Bilateral cataract resection. CT CERVICAL SPINE FINDINGS Alignment: No traumatic  malalignment. Degenerative appearing C3-4, C4-5, and C7-T1 anterolisthesis. Skull base and vertebrae: Negative for acute fracture. Soft tissues and spinal canal: No prevertebral fluid or swelling. No visible canal hematoma. Bilateral parotid calculi. Disc levels: Disc degeneration focally advanced at C5-6 and C6-7. Generalized degenerative facet spurring. Upper chest: Negative IMPRESSION: 1. No evidence of acute intracranial injury or cervical spine fracture. 2. Rights scalp laceration without calvarial fracture. Electronically Signed   By: Monte Fantasia M.D.   On: 08/10/2019 05:48   Ct Cervical Spine Wo Contrast  Result Date: 08/10/2019 CLINICAL DATA:  Fall with head trauma EXAM: CT HEAD WITHOUT CONTRAST CT CERVICAL SPINE WITHOUT CONTRAST TECHNIQUE: Multidetector CT imaging of the head and cervical spine was performed following the standard protocol without intravenous contrast. Multiplanar CT image reconstructions of the cervical spine were also generated. COMPARISON:  Brain MRI 07/15/2017 FINDINGS: CT HEAD FINDINGS Brain: No evidence of acute infarction, hemorrhage, hydrocephalus, extra-axial collection or mass lesion/mass effect. Age congruent volume loss and white matter appearance Vascular: No hyperdense vessel or unexpected calcification. Skull:  High right scalp laceration without calvarial fracture. Sinuses/Orbits: No evidence of injury. Bilateral cataract resection. CT CERVICAL SPINE FINDINGS Alignment: No traumatic malalignment. Degenerative appearing C3-4, C4-5, and C7-T1 anterolisthesis. Skull base and vertebrae: Negative for acute fracture. Soft tissues and spinal canal: No prevertebral fluid or swelling. No visible canal hematoma. Bilateral parotid calculi. Disc levels: Disc degeneration focally advanced at C5-6 and C6-7. Generalized degenerative facet spurring. Upper chest: Negative IMPRESSION: 1. No evidence of acute intracranial injury or cervical spine fracture. 2. Rights scalp laceration without calvarial fracture. Electronically Signed   By: Monte Fantasia M.D.   On: 08/10/2019 05:48   Ct Knee Right Wo Contrast  Result Date: 08/10/2019 CLINICAL DATA:  The trauma with tibial plateau fracture. EXAM: CT OF THE right KNEE WITHOUT CONTRAST TECHNIQUE: Multidetector CT imaging of the right knee was performed according to the standard protocol. Multiplanar CT image reconstructions were also generated. COMPARISON:  None. FINDINGS: Oblique fracture through the supracondylar femur, lateral and posterior eccentric, interposed between a femoral nail and the femoral component of a total knee arthroplasty. In addition to the dominant oblique fracture plane there is a small cortical fracture extending cranially through the posterior cortex of the distal femur, seem markings on coronal reformats. No displacement. No additional fracture noted. The distal interlocking screw of the femoral nail is fractured, likely on a chronic basis. Regional soft tissue swelling with small joint effusion. Prominent osteopenia. IMPRESSION: Oblique supracondylar femur fracture interposed between a femoral nail and total knee arthroplasty. Electronically Signed   By: Monte Fantasia M.D.   On: 08/10/2019 05:54   Dg Pelvis Portable  Result Date: 08/10/2019 CLINICAL DATA:   Fall EXAM: PORTABLE PELVIS 1-2 VIEWS COMPARISON:  None. FINDINGS: Remote intertrochanteric right femur fracture with healing after fixation. Remote left obturator ring and right inferior pubic ramus fractures. No evidence of acute fracture. Osteopenia is profound. Lower lumbar degenerative disease with probable L5 vertebroplasty. IMPRESSION: 1. No acute finding. 2. Remote pelvic and right proximal femoral fractures. Electronically Signed   By: Monte Fantasia M.D.   On: 08/10/2019 06:01   Dg Chest Portable 1 View  Result Date: 08/10/2019 CLINICAL DATA:  Fall EXAM: PORTABLE CHEST 1 VIEW COMPARISON:  11/20/2018 FINDINGS: Normal heart size and stable mediastinal contours. Chronic mild interstitial coarsening. There is no edema, consolidation, effusion, or pneumothorax. Postoperative right axilla. Prominent osteopenia and bilateral shoulder osteoarthritis. No evidence of acute fracture. IMPRESSION: Stable from prior.  No  acute finding. Electronically Signed   By: Monte Fantasia M.D.   On: 08/10/2019 06:00    EKG: Independently reviewed.  NSR with rate 85; PVCs with no evidence of acute ischemia   Labs on Admission: I have personally reviewed the available labs and imaging studies at the time of the admission.  Pertinent labs:   Pending - initial labs were pretty clearly a lab error, as glucose was 809 but POC glucose was 134 CBC, CMP, and INR are pending COVID pending   Assessment/Plan Principal Problem:   Supracondylar fracture of femur, right, open type I or II, initial encounter [facility]) Active Problems:   HTN (hypertension)   Supracondylar open R femur fracture s/p fall -Mechanical fall resulting in open R distal femur fracture; patient with proximal femoral nail from 12/19 injury as well as total arthroplasty of the R knee -Orthopedics consulted, will take patient to the OR later today -Unfortunately, patient had the Aptima COVID test and so results will not be available for 6-8 hours; due  to increased intraoperative mortality in patients with COVID infection, it seems likely that orthopedics will prefer to wait for this result prior to taking her to the OR -NPO in anticipation of surgical repair -SCD on left, foot pump on the right, start Lovenox post-operatively (or as per ortho) -Pain control with Robxain, Vicodin, and Morphine prn -SW consult for rehab placement -Will need PT consult post-operatively  HTN -Continue Toprol XL   Note: This patient has been tested and is pending for the novel coronavirus COVID-19.    DVT prophylaxis:  SCDs until approved for Lovenox by orthopedics Code Status:  DNR - confirmed with patient Family Communication: None present; I spoke with her son by telephone Disposition Plan:  Home once clinically improved Consults called: Orthopedics; SW, Nutrition; will need PT post-operatively  Admission status: Admit - It is my clinical opinion that admission to INPATIENT is reasonable and necessary because of the expectation that this patient will require hospital care that crosses at least 2 midnights to treat this condition based on the medical complexity of the problems presented.  Given the aforementioned information, the predictability of an adverse outcome is felt to be significant.     Karmen Bongo MD [facility]   How to contact the [facility] Attending or Consulting provider [place] or covering provider during after hours [place], for this patient?  1. Check the care team in [facility] and look for a) attending/consulting [facility] provider listed and b) the [facility] team listed 2. Log into [email or web address] and use [place]'s universal password to access. If you do not have the password, please contact the hospital operator. 3. Locate the [facility] provider you are looking for under [facility] and page to a number that you can be directly reached. 4. If you still have difficulty reaching the provider, please page the [facility] (Director on Call) for the  Hospitalists listed on amion for assistance.   08/10/2019, 8:14 AM

## 2019-08-10 NOTE — ED Notes (Signed)
Covid swab collected by this RN

## 2019-08-10 NOTE — ED Notes (Signed)
ED TO INPATIENT HANDOFF REPORT  ED Nurse Name and Phone #: .   S Name/Age/Gender Vanessa Knox 85 y.o. female Room/Bed: 029C/029C  Code Status   Code Status: DNR  Home/SNF/Other Home Patient oriented to: self, place, time and situation Is this baseline? Yes   Triage Complete: Triage complete  Chief Complaint fall  Triage Note Pt presents to ED from [place]. Pt c/o fall, lost balance. Neg blood thinners, no LOC, hit head on R side, R knee. EMS VSS.   Allergies Allergies  Allergen Reactions  . Keflex [Cephalexin] Diarrhea and Other (See Comments)    Headaches and dizziness also     Level of Care/Admitting Diagnosis ED Disposition    ED Disposition Condition [place]: [place] [[id number]]  Level of Care: Med-Surg [16]  Covid Evaluation: Asymptomatic Screening Protocol (No Symptoms)  Diagnosis: Periprosthetic intertrochanteric fracture of femur, initial encounter [id number]  Admitting Physician: Karmen Bongo [[id number]]  Attending Physician: Karmen Bongo [[id number]]  Estimated length of stay: 3 - 4 days  Certification:: I certify this patient will need inpatient services for at least 2 midnights  PT Class (Do Not Modify): Inpatient [101]  PT Acc Code (Do Not Modify): Private [1]       B Medical/Surgery History Past Medical History:  Diagnosis Date  . HTN (hypertension)   . Sciatica    Past Surgical History:  Procedure Laterality Date  . FEMUR IM NAIL Right 11/21/2018   Procedure: INTRAMEDULLARY (IM) NAIL FEMORAL;  Surgeon: Renette Butters, MD;  Location: [facility];  Service: Orthopedics;  Laterality: Right;  . ROTATOR CUFF REPAIR Right      A IV Location/Drains/Wounds Patient Lines/Drains/Airways Status   Active Line/Drains/Airways    Name:   Placement date:   Placement time:   Site:   Days:   Peripheral IV 08/10/19 Left;Posterior Hand   08/10/19    0438    Hand   less than 1   External Urinary  Catheter   11/21/18    1653    -   262   Incision (Closed) 11/21/18 Thigh Anterior;Proximal;Right   11/21/18    1703     262   Incision (Closed) 11/21/18 Thigh Anterior;Right;Mid   11/21/18    1703     262   Incision (Closed) 11/21/18 Thigh Anterior;Distal;Right   11/21/18    1704     262          Intake/Output Last 24 hours No intake or output data in the 24 hours ending 08/10/19 0836  Labs/Imaging Results for orders placed or performed during the hospital encounter of 08/10/19 (from the past 48 hour(s))  Type and screen [place]     Status: None   Collection Time: 08/10/19  7:12 AM  Result Value Ref Range   ABO/RH(D) A POS    Antibody Screen NEG    Sample Expiration      08/13/2019,2359 Performed at [facility], [place] [address] [place], [place] [address]   CBC with Differential/Platelet     Status: None   Collection Time: 08/10/19  7:22 AM  Result Value Ref Range   WBC  4.0 - 10.5 K/uL    QUESTIONABLE IDENTIFICATION / INCORRECTLY LABELED SPECIMEN    Comment: PER CINDY ROUGHGARDEN,RN AT 0825 08/10/2019 BY ZBEECH. CORRECTED ON 08/25 AT 0831: PREVIOUSLY REPORTED AS 13.2    RBC  3.87 - 5.11 MIL/uL    QUESTIONABLE IDENTIFICATION /  INCORRECTLY LABELED SPECIMEN    Comment: PER CINDY ROUGHGARDEN,RN AT 0825 08/10/2019 BY ZBEECH. CORRECTED ON 08/25 AT 0831: PREVIOUSLY REPORTED AS 3.11    Hemoglobin  12.0 - 15.0 g/dL    QUESTIONABLE IDENTIFICATION / INCORRECTLY LABELED SPECIMEN    Comment: PER CINDY ROUGHGARDEN,RN AT 0825 08/10/2019 BY ZBEECH. CORRECTED ON 08/25 AT 0831: PREVIOUSLY REPORTED AS 9.1    HCT  36.0 - 46.0 %    QUESTIONABLE IDENTIFICATION / INCORRECTLY LABELED SPECIMEN    Comment: PER CINDY ROUGHGARDEN,RN AT 0825 08/10/2019 BY ZBEECH. CORRECTED ON 08/25 AT 0831: PREVIOUSLY REPORTED AS 31.3    MCV  80.0 - 100.0 fL    QUESTIONABLE IDENTIFICATION / INCORRECTLY LABELED SPECIMEN    Comment: PER CINDY ROUGHGARDEN,RN AT 0825 08/10/2019 BY  ZBEECH. CORRECTED ON 08/25 AT 0831: PREVIOUSLY REPORTED AS 100.6    MCH  26.0 - 34.0 pg    QUESTIONABLE IDENTIFICATION / INCORRECTLY LABELED SPECIMEN    Comment: PER CINDY ROUGHGARDEN,RN AT 0825 08/10/2019 BY ZBEECH. CORRECTED ON 08/25 AT 0831: PREVIOUSLY REPORTED AS 29.3    MCHC  30.0 - 36.0 g/dL    QUESTIONABLE IDENTIFICATION / INCORRECTLY LABELED SPECIMEN    Comment: PER CINDY ROUGHGARDEN,RN AT 0825 08/10/2019 BY ZBEECH. CORRECTED ON 08/25 AT 0831: PREVIOUSLY REPORTED AS 29.1    RDW  11.5 - 15.5 %    QUESTIONABLE IDENTIFICATION / INCORRECTLY LABELED SPECIMEN    Comment: PER CINDY ROUGHGARDEN,RN AT 0825 08/10/2019 BY ZBEECH. CORRECTED ON 08/25 AT 0831: PREVIOUSLY REPORTED AS 13.9    Platelets  150 - 400 K/uL    QUESTIONABLE IDENTIFICATION / INCORRECTLY LABELED SPECIMEN    Comment: PER CINDY ROUGHGARDEN,RN AT 0825 08/10/2019 BY ZBEECH. CORRECTED ON 08/25 AT 0831: PREVIOUSLY REPORTED AS 210    nRBC  0.0 - 0.2 %    QUESTIONABLE IDENTIFICATION / INCORRECTLY LABELED SPECIMEN    Comment: PER CINDY ROUGHGARDEN,RN AT 0825 08/10/2019 BY ZBEECH. CORRECTED ON 08/25 AT 0831: PREVIOUSLY REPORTED AS 0.0    Neutrophils Relative %  %    QUESTIONABLE IDENTIFICATION / INCORRECTLY LABELED SPECIMEN    Comment: PER CINDY ROUGHGARDEN,RN AT 0825 08/10/2019 BY ZBEECH. CORRECTED ON 08/25 AT 0831: PREVIOUSLY REPORTED AS 84    Neutro Abs  1.7 - 7.7 K/uL    QUESTIONABLE IDENTIFICATION / INCORRECTLY LABELED SPECIMEN    Comment: PER CINDY ROUGHGARDEN,RN AT 0825 08/10/2019 BY ZBEECH. CORRECTED ON 08/25 AT 0831: PREVIOUSLY REPORTED AS 11.0    Band Neutrophils  %    QUESTIONABLE IDENTIFICATION / INCORRECTLY LABELED SPECIMEN    Comment: PER CINDY ROUGHGARDEN,RN AT 0825 08/10/2019 BY ZBEECH.   Lymphocytes Relative  %    QUESTIONABLE IDENTIFICATION / INCORRECTLY LABELED SPECIMEN    Comment: PER CINDY ROUGHGARDEN,RN AT 0825 08/10/2019 BY ZBEECH. CORRECTED ON 08/25 AT 0831: PREVIOUSLY REPORTED AS 11     Lymphs Abs  0.7 - 4.0 K/uL    QUESTIONABLE IDENTIFICATION / INCORRECTLY LABELED SPECIMEN    Comment: PER CINDY ROUGHGARDEN,RN AT 0825 08/10/2019 BY ZBEECH. CORRECTED ON 08/25 AT 0831: PREVIOUSLY REPORTED AS 1.5    Monocytes Relative  %    QUESTIONABLE IDENTIFICATION / INCORRECTLY LABELED SPECIMEN    Comment: PER CINDY ROUGHGARDEN,RN AT 0825 08/10/2019 BY ZBEECH. CORRECTED ON 08/25 AT 0831: PREVIOUSLY REPORTED AS 5    Monocytes Absolute  0.1 - 1.0 K/uL    QUESTIONABLE IDENTIFICATION / INCORRECTLY LABELED SPECIMEN    Comment: PER CINDY ROUGHGARDEN,RN AT 0825 08/10/2019 BY ZBEECH. CORRECTED ON 08/25 AT 0831: PREVIOUSLY REPORTED AS 0.7  Eosinophils Relative  %    QUESTIONABLE IDENTIFICATION / INCORRECTLY LABELED SPECIMEN    Comment: PER CINDY ROUGHGARDEN,RN AT 0825 08/10/2019 BY ZBEECH. CORRECTED ON 08/25 AT 0831: PREVIOUSLY REPORTED AS 0    Eosinophils Absolute  0.0 - 0.5 K/uL    QUESTIONABLE IDENTIFICATION / INCORRECTLY LABELED SPECIMEN    Comment: PER CINDY ROUGHGARDEN,RN AT 0825 08/10/2019 BY ZBEECH. CORRECTED ON 08/25 AT 0831: PREVIOUSLY REPORTED AS 0.0    Basophils Relative  %    QUESTIONABLE IDENTIFICATION / INCORRECTLY LABELED SPECIMEN    Comment: PER CINDY ROUGHGARDEN,RN AT 0825 08/10/2019 BY ZBEECH. CORRECTED ON 08/25 AT 0831: PREVIOUSLY REPORTED AS 0    Basophils Absolute  0.0 - 0.1 K/uL    QUESTIONABLE IDENTIFICATION / INCORRECTLY LABELED SPECIMEN    Comment: PER CINDY ROUGHGARDEN,RN AT 0825 08/10/2019 BY ZBEECH. CORRECTED ON 08/25 AT 0831: PREVIOUSLY REPORTED AS 0.0    WBC Morphology      QUESTIONABLE IDENTIFICATION / INCORRECTLY LABELED SPECIMEN    Comment: PER CINDY ROUGHGARDEN,RN AT 0825 08/10/2019 BY ZBEECH.   RBC Morphology      QUESTIONABLE IDENTIFICATION / INCORRECTLY LABELED SPECIMEN    Comment: PER CINDY ROUGHGARDEN,RN AT 0825 08/10/2019 BY ZBEECH.   Smear Review      QUESTIONABLE IDENTIFICATION / INCORRECTLY LABELED SPECIMEN    Comment: PER CINDY  ROUGHGARDEN,RN AT 0825 08/10/2019 BY ZBEECH.   Other  %    QUESTIONABLE IDENTIFICATION / INCORRECTLY LABELED SPECIMEN    Comment: PER CINDY ROUGHGARDEN,RN AT 0825 08/10/2019 BY ZBEECH.   nRBC  0 /100 WBC    QUESTIONABLE IDENTIFICATION / INCORRECTLY LABELED SPECIMEN    Comment: PER CINDY ROUGHGARDEN,RN AT 0825 08/10/2019 BY ZBEECH.   Metamyelocytes Relative  %    QUESTIONABLE IDENTIFICATION / INCORRECTLY LABELED SPECIMEN    Comment: PER CINDY ROUGHGARDEN,RN AT 0825 08/10/2019 BY ZBEECH.   Myelocytes  %    QUESTIONABLE IDENTIFICATION / INCORRECTLY LABELED SPECIMEN    Comment: PER CINDY ROUGHGARDEN,RN AT 0825 08/10/2019 BY ZBEECH.   Promyelocytes Relative  %    QUESTIONABLE IDENTIFICATION / INCORRECTLY LABELED SPECIMEN    Comment: PER CINDY ROUGHGARDEN,RN AT 0825 08/10/2019 BY ZBEECH.   Blasts  %    QUESTIONABLE IDENTIFICATION / INCORRECTLY LABELED SPECIMEN    Comment: PER CINDY ROUGHGARDEN,RN AT 0825 08/10/2019 BY ZBEECH.   Immature Granulocytes  %    QUESTIONABLE IDENTIFICATION / INCORRECTLY LABELED SPECIMEN    Comment: PER CINDY ROUGHGARDEN,RN AT 0825 08/10/2019 BY ZBEECH. CORRECTED ON 08/25 AT 0831: PREVIOUSLY REPORTED AS 0    Abs Immature Granulocytes  0.00 - 0.07 K/uL    QUESTIONABLE IDENTIFICATION / INCORRECTLY LABELED SPECIMEN    Comment: PER CINDY ROUGHGARDEN,RN AT 0825 08/10/2019 BY ZBEECH. Performed at [facility], [place] [address] [place],  [address] CORRECTED ON 08/25 AT 0831: PREVIOUSLY REPORTED AS [id number]   Basic metabolic panel     Status: None   Collection Time: 08/10/19  7:22 AM  Result Value Ref Range   Sodium  135 - 145 mmol/L    QUESTIONABLE IDENTIFICATION / INCORRECTLY LABELED SPECIMEN    Comment: PER CINDY ROUGHGARDEN,RN AT 0825 08/10/2019 BY ZBEECH. CORRECTED ON 08/25 AT 0831: PREVIOUSLY REPORTED AS 124    Potassium  3.5 - 5.1 mmol/L    QUESTIONABLE IDENTIFICATION / INCORRECTLY LABELED SPECIMEN    Comment: PER CINDY ROUGHGARDEN,RN AT 0825  08/10/2019 BY ZBEECH. CORRECTED ON 08/25 AT 0831: PREVIOUSLY REPORTED AS 3.4    Chloride  98 - 111 mmol/L    QUESTIONABLE IDENTIFICATION /  INCORRECTLY LABELED SPECIMEN    Comment: PER CINDY ROUGHGARDEN,RN AT 0825 08/10/2019 BY ZBEECH. CORRECTED ON 08/25 AT 0831: PREVIOUSLY REPORTED AS 87    CO2  22 - 32 mmol/L    QUESTIONABLE IDENTIFICATION / INCORRECTLY LABELED SPECIMEN    Comment: PER CINDY ROUGHGARDEN,RN AT 0825 08/10/2019 BY ZBEECH. CORRECTED ON 08/25 AT 0831: PREVIOUSLY REPORTED AS 23    Glucose, Bld  70 - 99 mg/dL    QUESTIONABLE IDENTIFICATION / INCORRECTLY LABELED SPECIMEN    Comment: PER CINDY ROUGHGARDEN,RN AT 0825 08/10/2019 BY ZBEECH. CORRECTED ON 08/25 AT 0831: PREVIOUSLY REPORTED AS 809 CRITICAL RESULT CALLED TO, READ BACK BY AND VERIFIED WITH: CINDY ROUGHGARDEN,RN AT [id number] 08/10/2019 BY ZBEECH.    BUN  8 - 23 mg/dL    QUESTIONABLE IDENTIFICATION / INCORRECTLY LABELED SPECIMEN    Comment: PER CINDY ROUGHGARDEN,RN AT 0825 08/10/2019 BY ZBEECH. CORRECTED ON 08/25 AT 0831: PREVIOUSLY REPORTED AS 20    Creatinine, Ser  0.44 - 1.00 mg/dL    QUESTIONABLE IDENTIFICATION / INCORRECTLY LABELED SPECIMEN    Comment: PER CINDY ROUGHGARDEN,RN AT 0825 08/10/2019 BY ZBEECH. CORRECTED ON 08/25 AT 0831: PREVIOUSLY REPORTED AS 0.98    Calcium  8.9 - 10.3 mg/dL    QUESTIONABLE IDENTIFICATION / INCORRECTLY LABELED SPECIMEN    Comment: PER CINDY ROUGHGARDEN,RN AT 0825 08/10/2019 BY ZBEECH. CORRECTED ON 08/25 AT 0831: PREVIOUSLY REPORTED AS 7.3    GFR calc non Af Amer  >60 mL/min    QUESTIONABLE IDENTIFICATION / INCORRECTLY LABELED SPECIMEN    Comment: PER CINDY ROUGHGARDEN,RN AT 0825 08/10/2019 BY ZBEECH. CORRECTED ON 08/25 AT 0831: PREVIOUSLY REPORTED AS 47    GFR calc Af Amer  >60 mL/min    QUESTIONABLE IDENTIFICATION / INCORRECTLY LABELED SPECIMEN    Comment: PER CINDY ROUGHGARDEN,RN AT 0825 08/10/2019 BY ZBEECH. CORRECTED ON 08/25 AT 0831: PREVIOUSLY REPORTED AS 54    Anion gap   5 - 15    QUESTIONABLE IDENTIFICATION / INCORRECTLY LABELED SPECIMEN    Comment: PER CINDY ROUGHGARDEN,RN AT 0825 08/10/2019 BY ZBEECH. Performed at [facility], [place] [address], [place], [place] [address] CORRECTED ON 08/25 AT 0831: PREVIOUSLY REPORTED AS 14   CBG monitoring, ED     Status: Abnormal   Collection Time: 08/10/19  8:01 AM  Result Value Ref Range   Glucose-Capillary 134 (H) 70 - 99 mg/dL   Ct Head Wo Contrast  Result Date: 08/10/2019 CLINICAL DATA:  Fall with head trauma EXAM: CT HEAD WITHOUT CONTRAST CT CERVICAL SPINE WITHOUT CONTRAST TECHNIQUE: Multidetector CT imaging of the head and cervical spine was performed following the standard protocol without intravenous contrast. Multiplanar CT image reconstructions of the cervical spine were also generated. COMPARISON:  Brain MRI 07/15/2017 FINDINGS: CT HEAD FINDINGS Brain: No evidence of acute infarction, hemorrhage, hydrocephalus, extra-axial collection or mass lesion/mass effect. Age congruent volume loss and white matter appearance Vascular: No hyperdense vessel or unexpected calcification. Skull: High right scalp laceration without calvarial fracture. Sinuses/Orbits: No evidence of injury. Bilateral cataract resection. CT CERVICAL SPINE FINDINGS Alignment: No traumatic malalignment. Degenerative appearing C3-4, C4-5, and C7-T1 anterolisthesis. Skull base and vertebrae: Negative for acute fracture. Soft tissues and spinal canal: No prevertebral fluid or swelling. No visible canal hematoma. Bilateral parotid calculi. Disc levels: Disc degeneration focally advanced at C5-6 and C6-7. Generalized degenerative facet spurring. Upper chest: Negative IMPRESSION: 1. No evidence of acute intracranial injury or cervical spine fracture. 2. Rights scalp laceration without calvarial fracture. Electronically Signed   By: Monte Fantasia  M.D.   On: 08/10/2019 05:48   Ct Cervical Spine Wo Contrast  Result Date: 08/10/2019 CLINICAL DATA:  Fall  with head trauma EXAM: CT HEAD WITHOUT CONTRAST CT CERVICAL SPINE WITHOUT CONTRAST TECHNIQUE: Multidetector CT imaging of the head and cervical spine was performed following the standard protocol without intravenous contrast. Multiplanar CT image reconstructions of the cervical spine were also generated. COMPARISON:  Brain MRI 07/15/2017 FINDINGS: CT HEAD FINDINGS Brain: No evidence of acute infarction, hemorrhage, hydrocephalus, extra-axial collection or mass lesion/mass effect. Age congruent volume loss and white matter appearance Vascular: No hyperdense vessel or unexpected calcification. Skull: High right scalp laceration without calvarial fracture. Sinuses/Orbits: No evidence of injury. Bilateral cataract resection. CT CERVICAL SPINE FINDINGS Alignment: No traumatic malalignment. Degenerative appearing C3-4, C4-5, and C7-T1 anterolisthesis. Skull base and vertebrae: Negative for acute fracture. Soft tissues and spinal canal: No prevertebral fluid or swelling. No visible canal hematoma. Bilateral parotid calculi. Disc levels: Disc degeneration focally advanced at C5-6 and C6-7. Generalized degenerative facet spurring. Upper chest: Negative IMPRESSION: 1. No evidence of acute intracranial injury or cervical spine fracture. 2. Rights scalp laceration without calvarial fracture. Electronically Signed   By: Monte Fantasia M.D.   On: 08/10/2019 05:48   Ct Knee Right Wo Contrast  Result Date: 08/10/2019 CLINICAL DATA:  The trauma with tibial plateau fracture. EXAM: CT OF THE right KNEE WITHOUT CONTRAST TECHNIQUE: Multidetector CT imaging of the right knee was performed according to the standard protocol. Multiplanar CT image reconstructions were also generated. COMPARISON:  None. FINDINGS: Oblique fracture through the supracondylar femur, lateral and posterior eccentric, interposed between a femoral nail and the femoral component of a total knee arthroplasty. In addition to the dominant oblique fracture plane  there is a small cortical fracture extending cranially through the posterior cortex of the distal femur, seem markings on coronal reformats. No displacement. No additional fracture noted. The distal interlocking screw of the femoral nail is fractured, likely on a chronic basis. Regional soft tissue swelling with small joint effusion. Prominent osteopenia. IMPRESSION: Oblique supracondylar femur fracture interposed between a femoral nail and total knee arthroplasty. Electronically Signed   By: Monte Fantasia M.D.   On: 08/10/2019 05:54   Dg Pelvis Portable  Result Date: 08/10/2019 CLINICAL DATA:  Fall EXAM: PORTABLE PELVIS 1-2 VIEWS COMPARISON:  None. FINDINGS: Remote intertrochanteric right femur fracture with healing after fixation. Remote left obturator ring and right inferior pubic ramus fractures. No evidence of acute fracture. Osteopenia is profound. Lower lumbar degenerative disease with probable L5 vertebroplasty. IMPRESSION: 1. No acute finding. 2. Remote pelvic and right proximal femoral fractures. Electronically Signed   By: Monte Fantasia M.D.   On: 08/10/2019 06:01   Dg Chest Portable 1 View  Result Date: 08/10/2019 CLINICAL DATA:  Fall EXAM: PORTABLE CHEST 1 VIEW COMPARISON:  11/20/2018 FINDINGS: Normal heart size and stable mediastinal contours. Chronic mild interstitial coarsening. There is no edema, consolidation, effusion, or pneumothorax. Postoperative right axilla. Prominent osteopenia and bilateral shoulder osteoarthritis. No evidence of acute fracture. IMPRESSION: Stable from prior.  No acute finding. Electronically Signed   By: Monte Fantasia M.D.   On: 08/10/2019 06:00    Pending Labs Unresulted Labs (From admission, onward)    Start     Ordered   08/11/19 0500  CBC  Tomorrow morning,   R     08/10/19 0808   08/11/19 [id number]  Basic metabolic panel  Tomorrow morning,   R     08/10/19 [id number]   08/10/19 [id number]  CBC with Differential/Platelet  Once,   STAT     08/10/19 0803    08/10/19 0804  Comprehensive metabolic panel  Once,   STAT     08/10/19 0803   08/10/19 0804  Protime-INR  Once,   STAT     08/10/19 0803   08/10/19 0608  SARS CORONAVIRUS 2 (TAT 6-12 HRS) Nasal Swab Aptima Multi Swab  (Asymptomatic/Tier 2 Patients Labs)  Once,   STAT    Question Answer Comment  Is this test for diagnosis or screening Screening   Symptomatic for COVID-19 as defined by CDC No   Hospitalized for COVID-19 No   Admitted to ICU for COVID-19 No   Previously tested for COVID-19 No   Resident in a congregate (group) care setting Yes   Employed in healthcare setting No   Pregnant No      08/10/19 0607          Vitals/Pain Today's Vitals   08/10/19 0439 08/10/19 0442 08/10/19 0448 08/10/19 0700  BP:    (!) 162/48  Pulse: 86   74  Resp: 17   (!) 23  Temp:   98.3 F (36.8 C)   TempSrc:   Oral   SpO2: 100%   97%  PainSc:  7       Isolation Precautions No active isolations  Medications Medications  metoprolol succinate (TOPROL-XL) 24 hr tablet 25 mg (has no administration in time range)  HYDROcodone-acetaminophen (NORCO/VICODIN) 5-325 MG per tablet 1-2 tablet (has no administration in time range)  morphine 2 MG/ML injection 0.5 mg (has no administration in time range)  methocarbamol (ROBAXIN) tablet 500 mg (has no administration in time range)    Or  methocarbamol (ROBAXIN) 500 mg in dextrose 5 % 50 mL IVPB (has no administration in time range)  docusate sodium (COLACE) capsule 100 mg (has no administration in time range)  polyethylene glycol (MIRALAX / GLYCOLAX) packet 17 g (has no administration in time range)  bisacodyl (DULCOLAX) EC tablet 5 mg (has no administration in time range)  acetaminophen (TYLENOL) tablet 650 mg (has no administration in time range)  ondansetron (ZOFRAN) injection 4 mg (has no administration in time range)  sodium chloride 0.9 % bolus 1,000 mL (has no administration in time range)  fentaNYL (SUBLIMAZE) injection 25 mcg (25 mcg  Intravenous Given 08/10/19 0558)  Tdap (BOOSTRIX) injection 0.5 mL (0.5 mLs Intramuscular Given 08/10/19 0655)  lidocaine-EPINEPHrine (XYLOCAINE W/EPI) 2 %-1:200000 (PF) injection 20 mL (20 mLs Infiltration Given by Other 08/10/19 0707)  clindamycin (CLEOCIN) IVPB 600 mg (600 mg Intravenous New Bag/Given 08/10/19 0643)    Mobility walks with device High fall risk   Focused Assessments Cardiac Assessment Handoff:    No results found for: CKTOTAL, CKMB, CKMBINDEX, TROPONINI No results found for: DDIMER Does the Patient currently have chest pain? No      R Recommendations: See Admitting Provider Note  Report given to:   Additional Notes: .

## 2019-08-10 NOTE — ED Triage Notes (Signed)
Pt presents to ED from [place]. Pt c/o fall, lost balance. Neg blood thinners, no LOC, hit head on R side, R knee. EMS VSS.

## 2019-08-10 NOTE — Progress Notes (Signed)
Orthopedic Tech Progress Note Patient Details:  Vanessa Knox 16, 1917 ID:[id number] Assisted the RN and [facility] with patient.Marland Kitchenafter applying the knee immobilizer  Ortho Devices Type of Ortho Device: Knee Immobilizer Ortho Device/Splint Location: LRE Ortho Device/Splint Interventions: Adjustment, Application, Ordered   Post Interventions Patient Tolerated: Well Instructions Provided: Care of device, Adjustment of device   Janit Pagan 08/10/2019, 11:01 AM

## 2019-08-10 NOTE — Consult Note (Addendum)
Reason for Consult:Right distal femur fx Referring Physician: Thomasenia Bottoms  Vanessa Knox is an 85 y.o. female.  HPI: Vanessa Knox was in her bathroom last night and fell into a corner. She says her right knee gave out. She was unable to get up or bear weight afterwards. She's unsure exactly how she landed. She was brought to the ED where x-rays showed a periprosthetic distal femur fx and a deep knee laceration and orthopedic surgery was consulted. She also had a scalp lac that was repaired. She lives alone in an independent living facility and usually ambulates with a RW. She had a right femur IMN in December after a fall.  Past Medical History:  Diagnosis Date  . HTN (hypertension)   . Sciatica     Past Surgical History:  Procedure Laterality Date  . FEMUR IM NAIL Right 11/21/2018   Procedure: INTRAMEDULLARY (IM) NAIL FEMORAL;  Surgeon: Renette Butters, MD;  Location: [facility];  Service: Orthopedics;  Laterality: Right;  . ROTATOR CUFF REPAIR Right     Family History  Problem Relation Age of Onset  . Heart failure Father     Social History:  reports that she has never smoked. She has never used smokeless tobacco. She reports that she does not drink alcohol or use drugs.  Allergies:  Allergies  Allergen Reactions  . Keflex [Cephalexin] Diarrhea and Other (See Comments)    Headaches and dizziness also     Medications: I have reviewed the patient's current medications.  Results for orders placed or performed during the hospital encounter of 08/10/19 (from the past 48 hour(s))  Type and screen [place]     Status: None   Collection Time: 08/10/19  7:12 AM  Result Value Ref Range   ABO/RH(D) A POS    Antibody Screen NEG    Sample Expiration      08/13/2019,2359 Performed at [facility], [place] [address], [place], [place] [address]   CBC with Differential/Platelet     Status: None   Collection Time: 08/10/19  7:22 AM  Result Value Ref Range   WBC  4.0 - 10.5 K/uL     QUESTIONABLE IDENTIFICATION / INCORRECTLY LABELED SPECIMEN    Comment: PER CINDY ROUGHGARDEN,RN AT 0825 08/10/2019 BY ZBEECH. CORRECTED ON 08/25 AT 0831: PREVIOUSLY REPORTED AS 13.2    RBC  3.87 - 5.11 MIL/uL    QUESTIONABLE IDENTIFICATION / INCORRECTLY LABELED SPECIMEN    Comment: PER CINDY ROUGHGARDEN,RN AT 0825 08/10/2019 BY ZBEECH. CORRECTED ON 08/25 AT 0831: PREVIOUSLY REPORTED AS 3.11    Hemoglobin  12.0 - 15.0 g/dL    QUESTIONABLE IDENTIFICATION / INCORRECTLY LABELED SPECIMEN    Comment: PER CINDY ROUGHGARDEN,RN AT 0825 08/10/2019 BY ZBEECH. CORRECTED ON 08/25 AT 0831: PREVIOUSLY REPORTED AS 9.1    HCT  36.0 - 46.0 %    QUESTIONABLE IDENTIFICATION / INCORRECTLY LABELED SPECIMEN    Comment: PER CINDY ROUGHGARDEN,RN AT 0825 08/10/2019 BY ZBEECH. CORRECTED ON 08/25 AT 0831: PREVIOUSLY REPORTED AS 31.3    MCV  80.0 - 100.0 fL    QUESTIONABLE IDENTIFICATION / INCORRECTLY LABELED SPECIMEN    Comment: PER CINDY ROUGHGARDEN,RN AT 0825 08/10/2019 BY ZBEECH. CORRECTED ON 08/25 AT 0831: PREVIOUSLY REPORTED AS 100.6    MCH  26.0 - 34.0 pg    QUESTIONABLE IDENTIFICATION / INCORRECTLY LABELED SPECIMEN    Comment: PER CINDY ROUGHGARDEN,RN AT 0825 08/10/2019 BY ZBEECH. CORRECTED ON 08/25 AT 0831: PREVIOUSLY REPORTED AS 29.3    MCHC  30.0 - 36.0 g/dL    QUESTIONABLE IDENTIFICATION / INCORRECTLY LABELED SPECIMEN    Comment: PER CINDY ROUGHGARDEN,RN AT 0825 08/10/2019 BY ZBEECH. CORRECTED ON 08/25 AT 0831: PREVIOUSLY REPORTED AS 29.1    RDW  11.5 - 15.5 %    QUESTIONABLE IDENTIFICATION / INCORRECTLY LABELED SPECIMEN    Comment: PER CINDY ROUGHGARDEN,RN AT 0825 08/10/2019 BY ZBEECH. CORRECTED ON 08/25 AT 0831: PREVIOUSLY REPORTED AS 13.9    Platelets  150 - 400 K/uL    QUESTIONABLE IDENTIFICATION / INCORRECTLY LABELED SPECIMEN    Comment: PER CINDY ROUGHGARDEN,RN AT 0825 08/10/2019 BY ZBEECH. CORRECTED ON 08/25 AT 0831: PREVIOUSLY REPORTED AS 210    nRBC  0.0 - 0.2 %     QUESTIONABLE IDENTIFICATION / INCORRECTLY LABELED SPECIMEN    Comment: PER CINDY ROUGHGARDEN,RN AT 0825 08/10/2019 BY ZBEECH. CORRECTED ON 08/25 AT 0831: PREVIOUSLY REPORTED AS 0.0    Neutrophils Relative %  %    QUESTIONABLE IDENTIFICATION / INCORRECTLY LABELED SPECIMEN    Comment: PER CINDY ROUGHGARDEN,RN AT 0825 08/10/2019 BY ZBEECH. CORRECTED ON 08/25 AT 0831: PREVIOUSLY REPORTED AS 84    Neutro Abs  1.7 - 7.7 K/uL    QUESTIONABLE IDENTIFICATION / INCORRECTLY LABELED SPECIMEN    Comment: PER CINDY ROUGHGARDEN,RN AT 0825 08/10/2019 BY ZBEECH. CORRECTED ON 08/25 AT 0831: PREVIOUSLY REPORTED AS 11.0    Band Neutrophils  %    QUESTIONABLE IDENTIFICATION / INCORRECTLY LABELED SPECIMEN    Comment: PER CINDY ROUGHGARDEN,RN AT 0825 08/10/2019 BY ZBEECH.   Lymphocytes Relative  %    QUESTIONABLE IDENTIFICATION / INCORRECTLY LABELED SPECIMEN    Comment: PER CINDY ROUGHGARDEN,RN AT 0825 08/10/2019 BY ZBEECH. CORRECTED ON 08/25 AT 0831: PREVIOUSLY REPORTED AS 11    Lymphs Abs  0.7 - 4.0 K/uL    QUESTIONABLE IDENTIFICATION / INCORRECTLY LABELED SPECIMEN    Comment: PER CINDY ROUGHGARDEN,RN AT 0825 08/10/2019 BY ZBEECH. CORRECTED ON 08/25 AT 0831: PREVIOUSLY REPORTED AS 1.5    Monocytes Relative  %    QUESTIONABLE IDENTIFICATION / INCORRECTLY LABELED SPECIMEN    Comment: PER CINDY ROUGHGARDEN,RN AT 0825 08/10/2019 BY ZBEECH. CORRECTED ON 08/25 AT 0831: PREVIOUSLY REPORTED AS 5    Monocytes Absolute  0.1 - 1.0 K/uL    QUESTIONABLE IDENTIFICATION / INCORRECTLY LABELED SPECIMEN    Comment: PER CINDY ROUGHGARDEN,RN AT 0825 08/10/2019 BY ZBEECH. CORRECTED ON 08/25 AT 0831: PREVIOUSLY REPORTED AS 0.7    Eosinophils Relative  %    QUESTIONABLE IDENTIFICATION / INCORRECTLY LABELED SPECIMEN    Comment: PER CINDY ROUGHGARDEN,RN AT 0825 08/10/2019 BY ZBEECH. CORRECTED ON 08/25 AT 0831: PREVIOUSLY REPORTED AS 0    Eosinophils Absolute  0.0 - 0.5 K/uL    QUESTIONABLE IDENTIFICATION / INCORRECTLY  LABELED SPECIMEN    Comment: PER CINDY ROUGHGARDEN,RN AT 0825 08/10/2019 BY ZBEECH. CORRECTED ON 08/25 AT 0831: PREVIOUSLY REPORTED AS 0.0    Basophils Relative  %    QUESTIONABLE IDENTIFICATION / INCORRECTLY LABELED SPECIMEN    Comment: PER CINDY ROUGHGARDEN,RN AT 0825 08/10/2019 BY ZBEECH. CORRECTED ON 08/25 AT 0831: PREVIOUSLY REPORTED AS 0    Basophils Absolute  0.0 - 0.1 K/uL    QUESTIONABLE IDENTIFICATION / INCORRECTLY LABELED SPECIMEN    Comment: PER CINDY ROUGHGARDEN,RN AT 0825 08/10/2019 BY ZBEECH. CORRECTED ON 08/25 AT 0831: PREVIOUSLY REPORTED AS 0.0    WBC Morphology      QUESTIONABLE IDENTIFICATION / INCORRECTLY LABELED SPECIMEN    Comment: PER CINDY ROUGHGARDEN,RN AT 0825 08/10/2019 BY ZBEECH.   RBC Morphology  QUESTIONABLE IDENTIFICATION / INCORRECTLY LABELED SPECIMEN    Comment: PER CINDY ROUGHGARDEN,RN AT 0825 08/10/2019 BY ZBEECH.   Smear Review      QUESTIONABLE IDENTIFICATION / INCORRECTLY LABELED SPECIMEN    Comment: PER CINDY ROUGHGARDEN,RN AT 0825 08/10/2019 BY ZBEECH.   Other  %    QUESTIONABLE IDENTIFICATION / INCORRECTLY LABELED SPECIMEN    Comment: PER CINDY ROUGHGARDEN,RN AT 0825 08/10/2019 BY ZBEECH.   nRBC  0 /100 WBC    QUESTIONABLE IDENTIFICATION / INCORRECTLY LABELED SPECIMEN    Comment: PER CINDY ROUGHGARDEN,RN AT 0825 08/10/2019 BY ZBEECH.   Metamyelocytes Relative  %    QUESTIONABLE IDENTIFICATION / INCORRECTLY LABELED SPECIMEN    Comment: PER CINDY ROUGHGARDEN,RN AT 0825 08/10/2019 BY ZBEECH.   Myelocytes  %    QUESTIONABLE IDENTIFICATION / INCORRECTLY LABELED SPECIMEN    Comment: PER CINDY ROUGHGARDEN,RN AT 0825 08/10/2019 BY ZBEECH.   Promyelocytes Relative  %    QUESTIONABLE IDENTIFICATION / INCORRECTLY LABELED SPECIMEN    Comment: PER CINDY ROUGHGARDEN,RN AT 0825 08/10/2019 BY ZBEECH.   Blasts  %    QUESTIONABLE IDENTIFICATION / INCORRECTLY LABELED SPECIMEN    Comment: PER CINDY ROUGHGARDEN,RN AT 0825 08/10/2019 BY ZBEECH.    Immature Granulocytes  %    QUESTIONABLE IDENTIFICATION / INCORRECTLY LABELED SPECIMEN    Comment: PER CINDY ROUGHGARDEN,RN AT 0825 08/10/2019 BY ZBEECH. CORRECTED ON 08/25 AT 0831: PREVIOUSLY REPORTED AS 0    Abs Immature Granulocytes  0.00 - 0.07 K/uL    QUESTIONABLE IDENTIFICATION / INCORRECTLY LABELED SPECIMEN    Comment: PER CINDY ROUGHGARDEN,RN AT 0825 08/10/2019 BY ZBEECH. Performed at [facility], [place] [address] [place], [place] [address] CORRECTED ON 08/25 AT 0831: PREVIOUSLY REPORTED AS [id number]   Basic metabolic panel     Status: None   Collection Time: 08/10/19  7:22 AM  Result Value Ref Range   Sodium  135 - 145 mmol/L    QUESTIONABLE IDENTIFICATION / INCORRECTLY LABELED SPECIMEN    Comment: PER CINDY ROUGHGARDEN,RN AT 0825 08/10/2019 BY ZBEECH. CORRECTED ON 08/25 AT 0831: PREVIOUSLY REPORTED AS 124    Potassium  3.5 - 5.1 mmol/L    QUESTIONABLE IDENTIFICATION / INCORRECTLY LABELED SPECIMEN    Comment: PER CINDY ROUGHGARDEN,RN AT 0825 08/10/2019 BY ZBEECH. CORRECTED ON 08/25 AT 0831: PREVIOUSLY REPORTED AS 3.4    Chloride  98 - 111 mmol/L    QUESTIONABLE IDENTIFICATION / INCORRECTLY LABELED SPECIMEN    Comment: PER CINDY ROUGHGARDEN,RN AT 0825 08/10/2019 BY ZBEECH. CORRECTED ON 08/25 AT 0831: PREVIOUSLY REPORTED AS 87    CO2  22 - 32 mmol/L    QUESTIONABLE IDENTIFICATION / INCORRECTLY LABELED SPECIMEN    Comment: PER CINDY ROUGHGARDEN,RN AT 0825 08/10/2019 BY ZBEECH. CORRECTED ON 08/25 AT 0831: PREVIOUSLY REPORTED AS 23    Glucose, Bld  70 - 99 mg/dL    QUESTIONABLE IDENTIFICATION / INCORRECTLY LABELED SPECIMEN    Comment: PER CINDY ROUGHGARDEN,RN AT 0825 08/10/2019 BY ZBEECH. CORRECTED ON 08/25 AT 0831: PREVIOUSLY REPORTED AS 809 CRITICAL RESULT CALLED TO, READ BACK BY AND VERIFIED WITH: CINDY ROUGHGARDEN,RN AT [id number] 08/10/2019 BY ZBEECH.    BUN  8 - 23 mg/dL    QUESTIONABLE IDENTIFICATION / INCORRECTLY LABELED SPECIMEN    Comment: PER CINDY ROUGHGARDEN,RN AT  0825 08/10/2019 BY ZBEECH. CORRECTED ON 08/25 AT 0831: PREVIOUSLY REPORTED AS 20    Creatinine, Ser  0.44 - 1.00 mg/dL    QUESTIONABLE IDENTIFICATION / INCORRECTLY LABELED SPECIMEN    Comment: PER CINDY ROUGHGARDEN,RN AT 0825  08/10/2019 BY ZBEECH. CORRECTED ON 08/25 AT 0831: PREVIOUSLY REPORTED AS 0.98    Calcium  8.9 - 10.3 mg/dL    QUESTIONABLE IDENTIFICATION / INCORRECTLY LABELED SPECIMEN    Comment: PER CINDY ROUGHGARDEN,RN AT 0825 08/10/2019 BY ZBEECH. CORRECTED ON 08/25 AT 0831: PREVIOUSLY REPORTED AS 7.3    GFR calc non Af Amer  >60 mL/min    QUESTIONABLE IDENTIFICATION / INCORRECTLY LABELED SPECIMEN    Comment: PER CINDY ROUGHGARDEN,RN AT 0825 08/10/2019 BY ZBEECH. CORRECTED ON 08/25 AT 0831: PREVIOUSLY REPORTED AS 47    GFR calc Af Amer  >60 mL/min    QUESTIONABLE IDENTIFICATION / INCORRECTLY LABELED SPECIMEN    Comment: PER CINDY ROUGHGARDEN,RN AT 0825 08/10/2019 BY ZBEECH. CORRECTED ON 08/25 AT 0831: PREVIOUSLY REPORTED AS 54    Anion gap  5 - 15    QUESTIONABLE IDENTIFICATION / INCORRECTLY LABELED SPECIMEN    Comment: PER CINDY ROUGHGARDEN,RN AT 0825 08/10/2019 BY ZBEECH. Performed at [facility], [place] [address], [place], [place] [address] CORRECTED ON 08/25 AT 0831: PREVIOUSLY REPORTED AS 14   CBG monitoring, ED     Status: Abnormal   Collection Time: 08/10/19  8:01 AM  Result Value Ref Range   Glucose-Capillary 134 (H) 70 - 99 mg/dL  CBC with Differential/Platelet     Status: Abnormal   Collection Time: 08/10/19  8:04 AM  Result Value Ref Range   WBC 14.9 (H) 4.0 - 10.5 K/uL   RBC 3.50 (L) 3.87 - 5.11 MIL/uL   Hemoglobin 10.3 (L) 12.0 - 15.0 g/dL   HCT 33.4 (L) 36.0 - 46.0 %   MCV 95.4 80.0 - 100.0 fL   MCH 29.4 26.0 - 34.0 pg   MCHC 30.8 30.0 - 36.0 g/dL   RDW 13.5 11.5 - 15.5 %   Platelets 248 150 - 400 K/uL   nRBC 0.0 0.0 - 0.2 %   Neutrophils Relative % 87 %   Neutro Abs 12.9 (H) 1.7 - 7.7 K/uL   Lymphocytes Relative 8 %   Lymphs Abs 1.2 0.7 -  4.0 K/uL   Monocytes Relative 5 %   Monocytes Absolute 0.8 0.1 - 1.0 K/uL   Eosinophils Relative 0 %   Eosinophils Absolute 0.0 0.0 - 0.5 K/uL   Basophils Relative 0 %   Basophils Absolute 0.1 0.0 - 0.1 K/uL   Immature Granulocytes 0 %   Abs Immature Granulocytes 0.05 0.00 - 0.07 K/uL    Comment: Performed at [facility], [address], [place], [place] [address]  Comprehensive metabolic panel     Status: Abnormal   Collection Time: 08/10/19  8:04 AM  Result Value Ref Range   Sodium 141 135 - 145 mmol/L   Potassium 3.9 3.5 - 5.1 mmol/L   Chloride 105 98 - 111 mmol/L   CO2 27 22 - 32 mmol/L   Glucose, Bld 165 (H) 70 - 99 mg/dL   BUN 23 8 - 23 mg/dL   Creatinine, Ser 1.04 (H) 0.44 - 1.00 mg/dL   Calcium 8.4 (L) 8.9 - 10.3 mg/dL   Total Protein 5.6 (L) 6.5 - 8.1 g/dL   Albumin 3.3 (L) 3.5 - 5.0 g/dL   AST 17 15 - 41 U/L   ALT 13 0 - 44 U/L   Alkaline Phosphatase 70 38 - 126 U/L   Total Bilirubin 0.7 0.3 - 1.2 mg/dL   GFR calc non Af Amer 44 (L) >60 mL/min   GFR calc Af Amer 51 (L) >60  mL/min   Anion gap 9 5 - 15    Comment: Performed at [facility] [address], [place], [place] [address]  Protime-INR     Status: None   Collection Time: 08/10/19  8:04 AM  Result Value Ref Range   Prothrombin Time 15.0 11.4 - 15.2 seconds   INR 1.2 0.8 - 1.2    Comment: (NOTE) INR goal varies based on device and disease states. Performed at [facility], [place] [address] [place], [place] [address]     Ct Head Wo Contrast  Result Date: 08/10/2019 CLINICAL DATA:  Fall with head trauma EXAM: CT HEAD WITHOUT CONTRAST CT CERVICAL SPINE WITHOUT CONTRAST TECHNIQUE: Multidetector CT imaging of the head and cervical spine was performed following the standard protocol without intravenous contrast. Multiplanar CT image reconstructions of the cervical spine were also generated. COMPARISON:  Brain MRI 07/15/2017 FINDINGS: CT HEAD FINDINGS Brain: No evidence of acute infarction,  hemorrhage, hydrocephalus, extra-axial collection or mass lesion/mass effect. Age congruent volume loss and white matter appearance Vascular: No hyperdense vessel or unexpected calcification. Skull: High right scalp laceration without calvarial fracture. Sinuses/Orbits: No evidence of injury. Bilateral cataract resection. CT CERVICAL SPINE FINDINGS Alignment: No traumatic malalignment. Degenerative appearing C3-4, C4-5, and C7-T1 anterolisthesis. Skull base and vertebrae: Negative for acute fracture. Soft tissues and spinal canal: No prevertebral fluid or swelling. No visible canal hematoma. Bilateral parotid calculi. Disc levels: Disc degeneration focally advanced at C5-6 and C6-7. Generalized degenerative facet spurring. Upper chest: Negative IMPRESSION: 1. No evidence of acute intracranial injury or cervical spine fracture. 2. Rights scalp laceration without calvarial fracture. Electronically Signed   By: Monte Fantasia M.D.   On: 08/10/2019 05:48   Ct Cervical Spine Wo Contrast  Result Date: 08/10/2019 CLINICAL DATA:  Fall with head trauma EXAM: CT HEAD WITHOUT CONTRAST CT CERVICAL SPINE WITHOUT CONTRAST TECHNIQUE: Multidetector CT imaging of the head and cervical spine was performed following the standard protocol without intravenous contrast. Multiplanar CT image reconstructions of the cervical spine were also generated. COMPARISON:  Brain MRI 07/15/2017 FINDINGS: CT HEAD FINDINGS Brain: No evidence of acute infarction, hemorrhage, hydrocephalus, extra-axial collection or mass lesion/mass effect. Age congruent volume loss and white matter appearance Vascular: No hyperdense vessel or unexpected calcification. Skull: High right scalp laceration without calvarial fracture. Sinuses/Orbits: No evidence of injury. Bilateral cataract resection. CT CERVICAL SPINE FINDINGS Alignment: No traumatic malalignment. Degenerative appearing C3-4, C4-5, and C7-T1 anterolisthesis. Skull base and vertebrae: Negative for  acute fracture. Soft tissues and spinal canal: No prevertebral fluid or swelling. No visible canal hematoma. Bilateral parotid calculi. Disc levels: Disc degeneration focally advanced at C5-6 and C6-7. Generalized degenerative facet spurring. Upper chest: Negative IMPRESSION: 1. No evidence of acute intracranial injury or cervical spine fracture. 2. Rights scalp laceration without calvarial fracture. Electronically Signed   By: Monte Fantasia M.D.   On: 08/10/2019 05:48   Ct Knee Right Wo Contrast  Result Date: 08/10/2019 CLINICAL DATA:  The trauma with tibial plateau fracture. EXAM: CT OF THE right KNEE WITHOUT CONTRAST TECHNIQUE: Multidetector CT imaging of the right knee was performed according to the standard protocol. Multiplanar CT image reconstructions were also generated. COMPARISON:  None. FINDINGS: Oblique fracture through the supracondylar femur, lateral and posterior eccentric, interposed between a femoral nail and the femoral component of a total knee arthroplasty. In addition to the dominant oblique fracture plane there is a small cortical fracture extending cranially through the posterior cortex of the distal femur, seem  markings on coronal reformats. No displacement. No additional fracture noted. The distal interlocking screw of the femoral nail is fractured, likely on a chronic basis. Regional soft tissue swelling with small joint effusion. Prominent osteopenia. IMPRESSION: Oblique supracondylar femur fracture interposed between a femoral nail and total knee arthroplasty. Electronically Signed   By: Monte Fantasia M.D.   On: 08/10/2019 05:54   Dg Pelvis Portable  Result Date: 08/10/2019 CLINICAL DATA:  Fall EXAM: PORTABLE PELVIS 1-2 VIEWS COMPARISON:  None. FINDINGS: Remote intertrochanteric right femur fracture with healing after fixation. Remote left obturator ring and right inferior pubic ramus fractures. No evidence of acute fracture. Osteopenia is profound. Lower lumbar degenerative  disease with probable L5 vertebroplasty. IMPRESSION: 1. No acute finding. 2. Remote pelvic and right proximal femoral fractures. Electronically Signed   By: Monte Fantasia M.D.   On: 08/10/2019 06:01   Dg Chest Portable 1 View  Result Date: 08/10/2019 CLINICAL DATA:  Fall EXAM: PORTABLE CHEST 1 VIEW COMPARISON:  11/20/2018 FINDINGS: Normal heart size and stable mediastinal contours. Chronic mild interstitial coarsening. There is no edema, consolidation, effusion, or pneumothorax. Postoperative right axilla. Prominent osteopenia and bilateral shoulder osteoarthritis. No evidence of acute fracture. IMPRESSION: Stable from prior.  No acute finding. Electronically Signed   By: Monte Fantasia M.D.   On: 08/10/2019 06:00   Dg Knee Right Port  Result Date: 08/10/2019 CLINICAL DATA:  History of fall. EXAM: PORTABLE RIGHT KNEE - 1-2 VIEW COMPARISON:  None. FINDINGS: Two views of the right knee were obtained. There is a total knee arthroplasty and a femoral intramedullary nail that extends into the distal femur with an interlocking screw. There is a posterior displaced fracture involving the distal femur. The fracture appears to be at or just below the distal aspect of the intramedullary nail. The fracture is at least mildly comminuted. Knee arthroplasty is located. Interlocking screw in the intramedullary nail appears to be fractured. There is callus formation involving the distal femur compatible with an old injury. Probable joint effusion with lipohemarthrosis. IMPRESSION: Displaced fracture involving the distal femur. Fracture appears to be situated between the intramedullary nail and the femoral prosthesis of the knee arthroplasty. Knee arthroplasty is located. Electronically Signed   By: Markus Daft M.D.   On: 08/10/2019 08:39    Review of Systems  Constitutional: Negative for weight loss.  HENT: Negative for ear discharge, ear pain, hearing loss and tinnitus.   Eyes: Negative for blurred vision, double  vision, photophobia and pain.  Respiratory: Negative for cough, sputum production and shortness of breath.   Cardiovascular: Negative for chest pain.  Gastrointestinal: Negative for abdominal pain, nausea and vomiting.  Genitourinary: Negative for dysuria, flank pain, frequency and urgency.  Musculoskeletal: Positive for joint pain (Right knee). Negative for back pain, falls, myalgias and neck pain.  Neurological: Positive for headaches. Negative for dizziness, tingling, sensory change, focal weakness and loss of consciousness.  Endo/Heme/Allergies: Does not bruise/bleed easily.  Psychiatric/Behavioral: Negative for depression, memory loss and substance abuse. The patient is not nervous/anxious.    Blood pressure (!) 162/48, pulse 74, temperature 98.3 F (36.8 C), temperature source Oral, resp. rate (!) 23, SpO2 97 %. Physical Exam  Constitutional: She appears well-developed and well-nourished. No distress.  HENT:  Head: Normocephalic.  Eyes: Conjunctivae are normal. Right eye exhibits no discharge. Left eye exhibits no discharge. No scleral icterus.  Neck: Normal range of motion.  Cardiovascular: Normal rate and regular rhythm.  Respiratory: Effort normal. No respiratory distress.  Musculoskeletal:  Comments: RLE Transverse knee lac ~10cm, no ecchymosis or rash  Mod TTP  No ankle effusion  Sens DPN, SPN, TN paresthetic  Motor EHL, ext, flex, evers 4/5  DP 2+, PT 1+, No significant edema   Neurological: She is alert.  Skin: Skin is warm and dry. She is not diaphoretic.  Psychiatric: She has a normal mood and affect. Her behavior is normal.    Assessment/Plan: Right periprosthetic distal femur fx -- For ORIF this afternoon by Dr. Percell Miller. Please keep NPO until then. Scalp lac HTN Spinal stenosis    Lisette Abu, PA-C Orthopedic Surgery [phone] 08/10/2019, 9:05 AM   COVID test still pending. Will plan for ORIF of this fracture tomorrow am with Dr.  Marion Downer

## 2019-08-11 ENCOUNTER — Inpatient Hospital Stay (HOSPITAL_COMMUNITY): Payer: Medicare Other | Admitting: Certified Registered"

## 2019-08-11 ENCOUNTER — Encounter (HOSPITAL_COMMUNITY)
Admission: EM | Disposition: A | Discharge status: Skilled Nursing Facility | Payer: Self-pay | Source: Home / Self Care | Attending: Internal Medicine

## 2019-08-11 ENCOUNTER — Inpatient Hospital Stay (HOSPITAL_COMMUNITY): Payer: Medicare Other

## 2019-08-11 ENCOUNTER — Other Ambulatory Visit: Payer: Self-pay

## 2019-08-11 ENCOUNTER — Encounter (HOSPITAL_COMMUNITY): Payer: Self-pay | Admitting: Orthopedic Surgery

## 2019-08-11 HISTORY — PX: ORIF FEMUR FRACTURE: SHX2119

## 2019-08-11 HISTORY — PX: I & D EXTREMITY: SHX5045

## 2019-08-11 LAB — MRSA PCR SCREENING: MRSA by PCR: POSITIVE — AB

## 2019-08-11 LAB — BASIC METABOLIC PANEL
Anion gap: 8 (ref 5–15)
BUN: 27 mg/dL — ABNORMAL HIGH (ref 8–23)
CO2: 28 mmol/L (ref 22–32)
Calcium: 8.1 mg/dL — ABNORMAL LOW (ref 8.9–10.3)
Chloride: 102 mmol/L (ref 98–111)
Creatinine, Ser: 1.18 mg/dL — ABNORMAL HIGH (ref 0.44–1.00)
GFR calc Af Amer: 44 mL/min — ABNORMAL LOW (ref >60)
GFR calc non Af Amer: 38 mL/min — ABNORMAL LOW (ref >60)
Glucose, Bld: 123 mg/dL — ABNORMAL HIGH (ref 70–99)
Potassium: 4.1 mmol/L (ref 3.5–5.1)
Sodium: 138 mmol/L (ref 135–145)

## 2019-08-11 LAB — CBC
HCT: 27.3 % — ABNORMAL LOW (ref 36.0–46.0)
Hemoglobin: 8.4 g/dL — ABNORMAL LOW (ref 12.0–15.0)
MCH: 29.2 pg (ref 26.0–34.0)
MCHC: 30.8 g/dL (ref 30.0–36.0)
MCV: 94.8 fL (ref 80.0–100.0)
Platelets: 207 10*3/uL (ref 150–400)
RBC: 2.88 MIL/uL — ABNORMAL LOW (ref 3.87–5.11)
RDW: 13.7 % (ref 11.5–15.5)
WBC: 8.8 10*3/uL (ref 4.0–10.5)
nRBC: 0 % (ref 0.0–0.2)

## 2019-08-11 SURGERY — OPEN REDUCTION INTERNAL FIXATION (ORIF) DISTAL FEMUR FRACTURE
Anesthesia: General | Site: Leg Lower | Laterality: Right

## 2019-08-11 MED ORDER — PROPOFOL 10 MG/ML IV BOLUS
INTRAVENOUS | Status: DC | PRN
  Administered 2019-08-11: 100 mg via INTRAVENOUS

## 2019-08-11 MED ORDER — MUPIROCIN 2 % EX OINT
TOPICAL_OINTMENT | CUTANEOUS | Status: AC
  Administered 2019-08-11: 1 via TOPICAL
  Filled 2019-08-11: qty 22

## 2019-08-11 MED ORDER — CEFAZOLIN SODIUM-DEXTROSE 2-4 GM/100ML-% IV SOLN
2.0000 g | Freq: Three times a day (TID) | INTRAVENOUS | Status: AC
  Administered 2019-08-11 – 2019-08-12 (×3): 2 g via INTRAVENOUS
  Filled 2019-08-11 (×3): qty 100

## 2019-08-11 MED ORDER — TOBRAMYCIN SULFATE 1.2 G IJ SOLR
INTRAMUSCULAR | Status: AC
  Filled 2019-08-11: qty 1.2

## 2019-08-11 MED ORDER — ENOXAPARIN SODIUM 30 MG/0.3ML ~~LOC~~ SOLN
30.0000 mg | SUBCUTANEOUS | Status: DC
  Administered 2019-08-12 – 2019-08-17 (×6): 30 mg via SUBCUTANEOUS
  Filled 2019-08-11 (×6): qty 0.3

## 2019-08-11 MED ORDER — DEXAMETHASONE SODIUM PHOSPHATE 10 MG/ML IJ SOLN
INTRAMUSCULAR | Status: DC | PRN
  Administered 2019-08-11: 10 mg via INTRAVENOUS

## 2019-08-11 MED ORDER — FENTANYL CITRATE (PF) 100 MCG/2ML IJ SOLN
INTRAMUSCULAR | Status: DC | PRN
  Administered 2019-08-11: 25 ug via INTRAVENOUS
  Administered 2019-08-11: 100 ug via INTRAVENOUS
  Administered 2019-08-11: 25 ug via INTRAVENOUS

## 2019-08-11 MED ORDER — EPHEDRINE SULFATE 50 MG/ML IJ SOLN
INTRAMUSCULAR | Status: DC | PRN
  Administered 2019-08-11: 15 mg via INTRAVENOUS

## 2019-08-11 MED ORDER — SUGAMMADEX SODIUM 500 MG/5ML IV SOLN
INTRAVENOUS | Status: AC
  Filled 2019-08-11: qty 5

## 2019-08-11 MED ORDER — BACITRACIN 500 UNIT/GM EX OINT
TOPICAL_OINTMENT | CUTANEOUS | Status: DC | PRN
  Administered 2019-08-11: 1 via TOPICAL

## 2019-08-11 MED ORDER — FENTANYL CITRATE (PF) 100 MCG/2ML IJ SOLN
25.0000 ug | INTRAMUSCULAR | Status: DC | PRN
  Administered 2019-08-11: 50 ug via INTRAVENOUS

## 2019-08-11 MED ORDER — MUPIROCIN 2 % EX OINT
1.0000 "application " | TOPICAL_OINTMENT | Freq: Two times a day (BID) | CUTANEOUS | Status: DC
  Administered 2019-08-11 – 2019-08-17 (×13): 1 via TOPICAL
  Filled 2019-08-11 (×4): qty 22

## 2019-08-11 MED ORDER — PHENYLEPHRINE 40 MCG/ML (10ML) SYRINGE FOR IV PUSH (FOR BLOOD PRESSURE SUPPORT)
PREFILLED_SYRINGE | INTRAVENOUS | Status: AC
  Filled 2019-08-11: qty 40

## 2019-08-11 MED ORDER — DEXAMETHASONE SODIUM PHOSPHATE 10 MG/ML IJ SOLN
INTRAMUSCULAR | Status: AC
  Filled 2019-08-11: qty 3

## 2019-08-11 MED ORDER — LIDOCAINE 2% (20 MG/ML) 5 ML SYRINGE
INTRAMUSCULAR | Status: DC | PRN
  Administered 2019-08-11: 60 mg via INTRAVENOUS

## 2019-08-11 MED ORDER — ENOXAPARIN SODIUM 40 MG/0.4ML ~~LOC~~ SOLN: 40.0000 mg | SUBCUTANEOUS | Status: DC

## 2019-08-11 MED ORDER — FENTANYL CITRATE (PF) 100 MCG/2ML IJ SOLN
INTRAMUSCULAR | Status: AC
  Filled 2019-08-11: qty 2

## 2019-08-11 MED ORDER — BACITRACIN ZINC 500 UNIT/GM EX OINT
TOPICAL_OINTMENT | CUTANEOUS | Status: AC
  Filled 2019-08-11: qty 28.35

## 2019-08-11 MED ORDER — ONDANSETRON HCL 4 MG/2ML IJ SOLN
INTRAMUSCULAR | Status: DC | PRN
  Administered 2019-08-11: 4 mg via INTRAVENOUS

## 2019-08-11 MED ORDER — 0.9 % SODIUM CHLORIDE (POUR BTL) OPTIME
TOPICAL | Status: DC | PRN
  Administered 2019-08-11: 1000 mL

## 2019-08-11 MED ORDER — PHENYLEPHRINE HCL (PRESSORS) 10 MG/ML IV SOLN
INTRAVENOUS | Status: DC | PRN
  Administered 2019-08-11: 80 ug via INTRAVENOUS

## 2019-08-11 MED ORDER — VANCOMYCIN HCL 1000 MG IV SOLR
INTRAVENOUS | Status: DC | PRN
  Administered 2019-08-11: 1000 mg via TOPICAL

## 2019-08-11 MED ORDER — MEPERIDINE HCL 25 MG/ML IJ SOLN: 6.2500 mg | INTRAMUSCULAR | Status: DC | PRN

## 2019-08-11 MED ORDER — LACTATED RINGERS IV SOLN
INTRAVENOUS | Status: DC | PRN
  Administered 2019-08-11: 12:00:00 via INTRAVENOUS

## 2019-08-11 MED ORDER — FENTANYL CITRATE (PF) 250 MCG/5ML IJ SOLN
INTRAMUSCULAR | Status: AC
  Filled 2019-08-11: qty 5

## 2019-08-11 MED ORDER — PROPOFOL 10 MG/ML IV BOLUS
INTRAVENOUS | Status: AC
  Filled 2019-08-11: qty 20

## 2019-08-11 MED ORDER — LIDOCAINE 2% (20 MG/ML) 5 ML SYRINGE
INTRAMUSCULAR | Status: AC
  Filled 2019-08-11: qty 15

## 2019-08-11 MED ORDER — ROCURONIUM BROMIDE 10 MG/ML (PF) SYRINGE
PREFILLED_SYRINGE | INTRAVENOUS | Status: AC
  Filled 2019-08-11: qty 10

## 2019-08-11 MED ORDER — VANCOMYCIN HCL 1000 MG IV SOLR
INTRAVENOUS | Status: AC
  Filled 2019-08-11: qty 1000

## 2019-08-11 MED ORDER — ONDANSETRON HCL 4 MG/2ML IJ SOLN
INTRAMUSCULAR | Status: AC
  Filled 2019-08-11: qty 10

## 2019-08-11 MED ORDER — LACTATED RINGERS IV SOLN
INTRAVENOUS | Status: DC
  Administered 2019-08-11: 10:00:00 via INTRAVENOUS

## 2019-08-11 MED ORDER — ENSURE ENLIVE PO LIQD
237.0000 mL | Freq: Two times a day (BID) | ORAL | Status: DC
  Administered 2019-08-12 – 2019-08-17 (×9): 237 mL via ORAL

## 2019-08-11 MED ORDER — METOCLOPRAMIDE HCL 5 MG/ML IJ SOLN: 10.0000 mg | Freq: Once | INTRAMUSCULAR | Status: DC | PRN

## 2019-08-11 MED ORDER — LACTATED RINGERS IV SOLN
INTRAVENOUS | Status: DC
  Administered 2019-08-11: 18:00:00 via INTRAVENOUS

## 2019-08-11 SURGICAL SUPPLY — 87 items
BIT DRILL LONG 3.3 (BIT) ×4 IMPLANT
BIT DRILL LONG 3.3MM (BIT) ×2
BIT DRILL QC 3.3X195 (BIT) ×3 IMPLANT
BLADE CLIPPER SURG (BLADE) IMPLANT
BNDG COHESIVE 4X5 TAN STRL (GAUZE/BANDAGES/DRESSINGS) ×3 IMPLANT
BNDG COHESIVE 6X5 TAN STRL LF (GAUZE/BANDAGES/DRESSINGS) ×3 IMPLANT
BNDG ELASTIC 6X10 VLCR STRL LF (GAUZE/BANDAGES/DRESSINGS) ×3 IMPLANT
BNDG GAUZE ELAST 4 BULKY (GAUZE/BANDAGES/DRESSINGS) ×6 IMPLANT
BRUSH SCRUB EZ PLAIN DRY (MISCELLANEOUS) ×3 IMPLANT
CANISTER SUCT 3000ML PPV (MISCELLANEOUS) ×3 IMPLANT
CAP LOCK NCB (Cap) ×24 IMPLANT
CHLORAPREP W/TINT 26 (MISCELLANEOUS) ×6 IMPLANT
COVER MAYO STAND STRL (DRAPES) ×3 IMPLANT
COVER SURGICAL LIGHT HANDLE (MISCELLANEOUS) ×3 IMPLANT
COVER WAND RF STERILE (DRAPES) ×3 IMPLANT
DRAPE C-ARM 42X72 X-RAY (DRAPES) ×3 IMPLANT
DRAPE C-ARMOR (DRAPES) ×3 IMPLANT
DRAPE HALF SHEET 40X57 (DRAPES) ×6 IMPLANT
DRAPE ORTHO SPLIT 77X108 STRL (DRAPES) ×4
DRAPE SURG 17X23 STRL (DRAPES) ×3 IMPLANT
DRAPE SURG ORHT 6 SPLT 77X108 (DRAPES) ×2 IMPLANT
DRAPE U-SHAPE 47X51 STRL (DRAPES) ×3 IMPLANT
DRSG ADAPTIC 3X8 NADH LF (GAUZE/BANDAGES/DRESSINGS) ×3 IMPLANT
DRSG MEPILEX BORDER 4X12 (GAUZE/BANDAGES/DRESSINGS) IMPLANT
DRSG MEPILEX BORDER 4X4 (GAUZE/BANDAGES/DRESSINGS) IMPLANT
DRSG MEPILEX BORDER 4X8 (GAUZE/BANDAGES/DRESSINGS) IMPLANT
DRSG PAD ABDOMINAL 8X10 ST (GAUZE/BANDAGES/DRESSINGS) ×9 IMPLANT
ELECT REM PT RETURN 9FT ADLT (ELECTROSURGICAL) ×3
ELECTRODE REM PT RTRN 9FT ADLT (ELECTROSURGICAL) ×1 IMPLANT
EVACUATOR 1/8 PVC DRAIN (DRAIN) IMPLANT
GAUZE SPONGE 4X4 12PLY STRL (GAUZE/BANDAGES/DRESSINGS) ×3 IMPLANT
GLOVE BIO SURGEON STRL SZ 6.5 (GLOVE) ×6 IMPLANT
GLOVE BIO SURGEON STRL SZ7.5 (GLOVE) ×12 IMPLANT
GLOVE BIO SURGEONS STRL SZ 6.5 (GLOVE) ×3
GLOVE BIOGEL PI IND STRL 6.5 (GLOVE) ×1 IMPLANT
GLOVE BIOGEL PI IND STRL 7.5 (GLOVE) ×1 IMPLANT
GLOVE BIOGEL PI INDICATOR 6.5 (GLOVE) ×2
GLOVE BIOGEL PI INDICATOR 7.5 (GLOVE) ×2
GOWN STRL REUS W/ TWL LRG LVL3 (GOWN DISPOSABLE) ×3 IMPLANT
GOWN STRL REUS W/TWL LRG LVL3 (GOWN DISPOSABLE) ×6
HANDPIECE INTERPULSE COAX TIP (DISPOSABLE)
K-WIRE 2.0 (WIRE) ×2
K-WIRE FXSTD 280X2XNS SS (WIRE) ×1
KIT BASIN OR (CUSTOM PROCEDURE TRAY) ×3 IMPLANT
KIT TURNOVER KIT B (KITS) ×3 IMPLANT
KWIRE FXSTD 280X2XNS SS (WIRE) ×1 IMPLANT
MANIFOLD NEPTUNE II (INSTRUMENTS) ×3 IMPLANT
NS IRRIG 1000ML POUR BTL (IV SOLUTION) ×3 IMPLANT
PACK ORTHO EXTREMITY (CUSTOM PROCEDURE TRAY) ×3 IMPLANT
PACK TOTAL JOINT (CUSTOM PROCEDURE TRAY) ×3 IMPLANT
PAD ARMBOARD 7.5X6 YLW CONV (MISCELLANEOUS) ×6 IMPLANT
PAD CAST 4YDX4 CTTN HI CHSV (CAST SUPPLIES) ×1 IMPLANT
PADDING CAST COTTON 4X4 STRL (CAST SUPPLIES) ×2
PADDING CAST COTTON 6X4 STRL (CAST SUPPLIES) ×3 IMPLANT
PLATE FEM DIST NCB PP 278MM (Plate) ×3 IMPLANT
SCREW 5.0 80MM (Screw) ×9 IMPLANT
SCREW CORTICAL NCB 5.0X65 (Screw) ×3 IMPLANT
SCREW NCB 3.5X75X5X6.2XST (Screw) ×2 IMPLANT
SCREW NCB 4.0MX38M (Screw) ×6 IMPLANT
SCREW NCB 4.0MX50M (Screw) ×3 IMPLANT
SCREW NCB 4.0X36MM (Screw) ×3 IMPLANT
SCREW NCB 4.0X40MM (Screw) ×3 IMPLANT
SCREW NCB 5.0X75MM (Screw) ×4 IMPLANT
SET HNDPC FAN SPRY TIP SCT (DISPOSABLE) IMPLANT
SPONGE LAP 18X18 RF (DISPOSABLE) ×3 IMPLANT
STAPLER VISISTAT 35W (STAPLE) ×3 IMPLANT
SUCTION FRAZIER HANDLE 10FR (MISCELLANEOUS) ×2
SUCTION TUBE FRAZIER 10FR DISP (MISCELLANEOUS) ×1 IMPLANT
SUT ETHILON 2 0 FS 18 (SUTURE) ×6 IMPLANT
SUT ETHILON 3 0 PS 1 (SUTURE) ×6 IMPLANT
SUT MON AB 2-0 CT1 36 (SUTURE) ×3 IMPLANT
SUT PDS AB 0 CT 36 (SUTURE) IMPLANT
SUT VIC AB 0 CT1 27 (SUTURE)
SUT VIC AB 0 CT1 27XBRD ANBCTR (SUTURE) IMPLANT
SUT VIC AB 1 CT1 27 (SUTURE)
SUT VIC AB 1 CT1 27XBRD ANBCTR (SUTURE) IMPLANT
SUT VIC AB 2-0 CT1 27 (SUTURE) ×4
SUT VIC AB 2-0 CT1 TAPERPNT 27 (SUTURE) ×2 IMPLANT
SWAB CULTURE ESWAB REG 1ML (MISCELLANEOUS) IMPLANT
TOWEL GREEN STERILE (TOWEL DISPOSABLE) ×6 IMPLANT
TOWEL GREEN STERILE FF (TOWEL DISPOSABLE) ×3 IMPLANT
TRAY FOLEY MTR SLVR 16FR STAT (SET/KITS/TRAYS/PACK) IMPLANT
TUBE CONNECTING 12'X1/4 (SUCTIONS) ×1
TUBE CONNECTING 12X1/4 (SUCTIONS) ×2 IMPLANT
UNDERPAD 30X30 (UNDERPADS AND DIAPERS) ×3 IMPLANT
WATER STERILE IRR 1000ML POUR (IV SOLUTION) ×6 IMPLANT
YANKAUER SUCT BULB TIP NO VENT (SUCTIONS) ×3 IMPLANT

## 2019-08-11 NOTE — Anesthesia Preprocedure Evaluation (Signed)
Anesthesia Evaluation  Patient identified by MRN, date of birth, ID band Patient awake    Reviewed: Allergy & Precautions, NPO status , Patient's Chart, lab work & pertinent test results  Airway Mallampati: II  TM Distance: >3 FB Neck ROM: Full    Dental no notable dental hx.    Pulmonary neg pulmonary ROS,    Pulmonary exam normal breath sounds clear to auscultation       Cardiovascular hypertension, Pt. on medications negative cardio ROS Normal cardiovascular exam Rhythm:Regular Rate:Normal     Neuro/Psych TIAnegative psych ROS   GI/Hepatic negative GI ROS, Neg liver ROS,   Endo/Other  negative endocrine ROS  Renal/GU negative Renal ROS  negative genitourinary   Musculoskeletal negative musculoskeletal ROS (+)   Abdominal   Peds negative pediatric ROS (+)  Hematology negative hematology ROS (+)   Anesthesia Other Findings   Reproductive/Obstetrics negative OB ROS                            Anesthesia Physical Anesthesia Plan  ASA: II  Anesthesia Plan: General   Post-op Pain Management:    Induction: Intravenous  PONV Risk Score and Plan: 3 and Ondansetron and Treatment may vary due to age or medical condition  Airway Management Planned: LMA  Additional Equipment:   Intra-op Plan:   Post-operative Plan: Extubation in OR  Informed Consent: I have reviewed the patients History and Physical, chart, labs and discussed the procedure including the risks, benefits and alternatives for the proposed anesthesia with the patient or authorized representative who has indicated his/her understanding and acceptance.     Dental advisory given  Plan Discussed with: CRNA  Anesthesia Plan Comments:         Anesthesia Quick Evaluation

## 2019-08-11 NOTE — Progress Notes (Signed)
Report given to [facility] RN in Short Stay.  The patient's son was able to visit.  The patient is ready for surgery.  Alert and oriented x 4

## 2019-08-11 NOTE — Op Note (Signed)
Orthopaedic Surgery Operative Note (CSN: [id number] ) Date of Surgery: 08/11/2019  Admit Date: 08/10/2019   Diagnoses: Pre-Op Diagnoses: Right interprosthetic distal femur fracture   Post-Op Diagnosis: Same  Procedures: 1. CPT [id number] reduction internal fixation of right distal femur fracture 2. CPT 12006-Debridement and closure of right knee skin tear  Surgeons : Primary: Melania Kirks, Thomasene Lot, MD  Assistant: Patrecia Pace, PA-C  Location: OR 6   Anesthesia: General  Antibiotics: Ancef 2g preop   Tourniquet time:None  Estimated Blood 123XX123 mL  Complications:None   Specimens:None  Implants: Implant Name Type Inv. Item Serial No. Manufacturer Lot No. LRB No. Used Action  PLATE FEM DIST NCB PP 278MM - ED:[id number] Plate PLATE FEM DIST NCB PP 278MM  [employer](ORTH,TRAU,BIO,SG)  Right 1 Implanted  CAP LOCK NCB - ED:2346285 Cap CAP LOCK NCB  [employer](ORTH,TRAU,BIO,SG)  Right 8 Implanted  SCREW NCB 4.0X40MM - ED:2346285 Screw SCREW NCB 4.0X40MM  [employer](ORTH,TRAU,BIO,SG)  Right 1 Implanted  SCREW NCB 4.0MX38M - ED:2346285 Screw SCREW NCB 4.0MX38M  [employer](ORTH,TRAU,BIO,SG)  Right 2 Implanted  SCREW NCB 4.0X36MM - ED:2346285 Screw SCREW NCB 4.0X36MM  [employer](ORTH,TRAU,BIO,SG)  Right 1 Implanted  SCREW 5.0 80MM - ED:2346285 Screw SCREW 5.0 80MM  [employer](ORTH,TRAU,BIO,SG)  Right 3 Implanted  SCREW NCB 5.0X75MM - ED:2346285 Screw SCREW NCB 5.0X75MM  [employer](ORTH,TRAU,BIO,SG)  Right 2 Implanted  SCREW CORTICAL NCB 5.0X65 - ED:2346285 Screw SCREW CORTICAL NCB 5.0X65  [employer](ORTH,TRAU,BIO,SG)  Right 1 Implanted  SCREW NCB 4.0MX50M JS:[id number] Screw SCREW NCB 4.0MX50M  [employer](ORTH,TRAU,BIO,SG)  Right 1 Implanted     Indications for Surgery: 85 year old female who sustained a ground-level fall and had a right supracondylar distal femur fracture.  This was between a total knee replacement and previous cephalo-medullary nail placed by Dr. Percell Miller in  December 2019.  She also had a laceration over her kneecap with a subsequent skin tear.  I recommended proceeding with open reduction internal fixation.  Risks and benefits were discussed.  Risks discussed included bleeding requiring blood transfusion, bleeding causing a hematoma, infection, malunion, nonunion, damage to surrounding nerves and blood vessels, pain, hardware prominence or irritation, hardware failure, stiffness, post-traumatic arthritis, DVT/PE, compartment syndrome, and even death.  She agreed to proceed with surgery and consent was obtained.  Operative Findings: 1.  Debridement and closure of laceration over her knee which was more consistent with a skin tear.  Total size was approximately 16 cm 2.  Open reduction internal fixation of right inter-prosthetic distal femur fracture using [employer] NCB distal femoral locking plate.  Procedure: The patient was identified in the preoperative holding area. Consent was confirmed with the patient and their family and all questions were answered. The operative extremity was marked after confirmation with the patient. she was then brought back to the operating room by our anesthesia colleagues.  She was placed under general anesthetic and carefully transferred over to a radiolucent flat top table.  A bump was placed under her operative hip. The operative extremity was then prepped and draped in usual sterile fashion. A preoperative timeout was performed to verify the patient, the procedure, and the extremity. Preoperative antibiotics were dosed.  For started with the laceration over her knee.  This was basically a skin tear.  There is no violation of the fascia or retinaculum.  It is relatively clean with no contamination.  I proceeded to debride this and irrigated.  It was left open for the case with closure at the end.  I changed  gloves and instruments after the debridement and proceeded with the internal fixation portion of the procedure.  I  then made a direct lateral approach to the distal femur carried it down through skin and subcutaneous tissue.  I incised the IT band in line with my incision.  I exposed the articular block of the distal femur.  I tried to keep the soft tissue in place over the fracture.  I released some of the vastus and elevated this back up to be able to slide the plate submuscularly.  I attempted to make a small incision through the previous interlocking incision.  I could not find the screw head and it was bent and seemed that it was buried under bone.  It was not in the way fixation so I just left in place.  The knee was flexed over a triangle reduction maneuver was performed to appropriate aligned the fracture on AP and lateral views.  A 12 hole NCB distal femoral locking plate was slid submuscularly with the jig in place.  A K wire was used to provisionally hold the location distally.  A percutaneous incision was made proximally and a 3.3 mm drill bit was used to bicortically drill anterior to the nail across both cortices of the femur.  Once the position of the plate proximally was confirmed to 5.0 millimeter screws were placed in the distal segment.  Good purchase was obtained.  I then placed percutaneous incisions and 4.0 millimeter screws were placed through the jig to bring the plate flush to the bone and correct some of the coronal alignment.  I was able to get bicortical fixation anterior to the nail with my 4.0 millimeter screws in the shaft.  I placed locking caps to the shaft screws except for the most proximal screw.  I removed the jig and then proceeded to place for more screws distally.  Locking caps were placed on all the screws distally.  Fluoroscopic images were obtained and it appeared that I can place a screw through the plate and through the nail to reinforce the fixation.  A 3.3 mm drill bit was used to drill bicortically.  I then placed a 4.0 millimeter screw and obtained good purchase in the bone  through the previous nail.  Final fluoroscopic images were obtained.  The incision was copiously irrigated.  A gram of vancomycin powder was placed between the 2 incisions of the laceration.  I then closed the IT band with 0 Vicryl suture.  The skin was closed with 2-0 Vicryl and 3-0 nylon.  The skin tear was closed with 3-0 nylon.  Sterile dressing consisting of bacitracin ointment, Adaptic, 4 x 4's and sterile cast padding was placed.  Patient was then awoken from anesthesia and taken to PACU in stable condition.  Post Op Plan/Instructions: Patient will be weightbearing as tolerated to the right lower extremity.  No immobilization is needed.  Ancef for postoperative surgical prophylaxis.  Lovenox for DVT prophylaxis while in the hospital.  Plan to mobilize with physical therapy.  I was present and performed the entire surgery.  Patrecia Pace, PA-C did assist me throughout the case. An assistant was necessary given the difficulty in approach, maintenance of reduction and ability to instrument the fracture.   Katha Hamming, MD [facility]

## 2019-08-11 NOTE — Anesthesia Postprocedure Evaluation (Signed)
Anesthesia Post Note  Patient: Vanessa Knox  Procedure(s) Performed: OPEN REDUCTION INTERNAL FIXATION (ORIF) SUPRACONDYLAR FRACTURE (Right Leg Lower) IRRIGATION AND DEBRIDEMENT PATELLA AND OPEN FEMUR FRACTURE (Right Leg Lower)     Patient location during evaluation: PACU Anesthesia Type: General Level of consciousness: awake and alert Pain management: pain level controlled Vital Signs Assessment: post-procedure vital signs reviewed and stable Respiratory status: spontaneous breathing, nonlabored ventilation, respiratory function stable and patient connected to nasal cannula oxygen Cardiovascular status: blood pressure returned to baseline and stable Postop Assessment: no apparent nausea or vomiting Anesthetic complications: no    Last Vitals:  Vitals:   08/11/19 1400 08/11/19 1415  BP:  (!) 159/60  Pulse:  75  Resp:  (!) 22  Temp: 36.5 C   SpO2:  96%    Last Pain:  Vitals:   08/11/19 1415  TempSrc:   PainSc: 8                  Montez Hageman

## 2019-08-11 NOTE — Progress Notes (Signed)
Initial Nutrition Assessment  DOCUMENTATION CODES:   Not applicable  INTERVENTION:  Once diet advances, provide Ensure Enlive po BID, each supplement provides 350 kcal and 20 grams of protein.  NUTRITION DIAGNOSIS:   Increased nutrient needs related to post-op healing as evidenced by estimated needs.  GOAL:   Patient will meet greater than or equal to 90% of their needs  MONITOR:   Supplement acceptance, Diet advancement, Skin, Weight trends, Labs, I & O's  REASON FOR ASSESSMENT:   Consult Hip fracture protocol  ASSESSMENT:   85 y.o. female with medical history significant of HTN and spinal stenosis presenting with a mechanical fall. X-rays showed a periprosthetic distal femur fracture she also had lacerations about her knee as well as her scalp and forehead  Procedure (8/26): OPEN REDUCTION INTERNAL FIXATION (ORIF) SUPRACONDYLAR FRACTURE (Right Leg Lower) IRRIGATION AND DEBRIDEMENT PATELLA AND OPEN FEMUR FRACTURE (Right Leg Lower)   Pt currently in OR. RD unable to obtain pt nutrition history at this time. RD to order nutritional supplements to aid in caloric and protein needs as well as in post op healing.  Unable to complete Nutrition-Focused physical exam at this time.   Labs and medications reviewed.   Diet Order:   Diet Order            Diet NPO time specified  Diet effective midnight              EDUCATION NEEDS:   Not appropriate for education at this time  Skin:  Skin Assessment: Skin Integrity Issues: Skin Integrity Issues:: Incisions Incisions: R leg  Last BM:     Height:   Ht Readings from Last 1 Encounters:  08/11/19 5' 4.02" (1.626 m)    Weight:   Wt Readings from Last 1 Encounters:  08/11/19 63.5 kg    Ideal Body Weight:  54.5 kg  BMI:  Body mass index is 24.02 kg/m.  Estimated Nutritional Needs:   Kcal:  1500-1650  Protein:  60-70 grams  Fluid:  >/= 1.5 L/day    Corrin Parker, MS, RD, LDN Pager #  [phone] After hours/ weekend pager # [phone]

## 2019-08-11 NOTE — Transfer of Care (Signed)
Immediate Anesthesia Transfer of Care Note  Patient: Vanessa Knox  Procedure(s) Performed: OPEN REDUCTION INTERNAL FIXATION (ORIF) SUPRACONDYLAR FRACTURE (Right Leg Lower) IRRIGATION AND DEBRIDEMENT PATELLA AND OPEN FEMUR FRACTURE (Right Leg Lower)  Patient Location: PACU  Anesthesia Type:General  Level of Consciousness: awake, alert  and oriented  Airway & Oxygen Therapy: Patient Spontanous Breathing and Patient connected to nasal cannula oxygen  Post-op Assessment: Report given to RN and Post -op Vital signs reviewed and stable  Post vital signs: Reviewed and stable  Last Vitals:  Vitals Value Taken Time  BP 150/52 08/11/19 1359  Temp    Pulse 76 08/11/19 1401  Resp 18 08/11/19 1401  SpO2 99 % 08/11/19 1401  Vitals shown include unvalidated device data.  Last Pain:  Vitals:   08/11/19 0806  TempSrc:   PainSc: 4       Patients Stated Pain Goal: 1 ([id number])  Complications: No apparent anesthesia complications

## 2019-08-11 NOTE — Consult Note (Signed)
[facility] (OTS) Consult   Patient ID: Vanessa Knox MRN: [id number] DOB/AGE: 1916/03/10 85 y.o.  Reason for Consult:Right distal femur fracture Referring Physician: Dr. Edmonia Lynch, MD Raliegh Ip  HPI: Vanessa Knox is an 85 y.o. female who is being seen in consultation at the request of Dr. Percell Miller for evaluation of right femur fracture.  The patient sustained a fall 2 days ago.  She had immediate pain and inability to bear weight.  She was brought to the emergency room where x-rays showed a periprosthetic distal femur fracture she also had lacerations about her knee as well as her scalp and forehead.  She had a previous cephalo-medullary device placed by Dr. Percell Miller for a intertrochanteric femur fracture in December 2019.  She was able to recover and lives in an independent living facility currently.  Patient was seen and evaluated on 5 N.  Her son is at bedside.  Currently not having much pain.  Her main complaint is been n.p.o. for almost 2 days.  Denies any numbness or tingling.  Denies any other injuries other than her head.  Prior to her fall she was ambulating with the use of a walker.  Past Medical History:  Diagnosis Date  . HTN (hypertension)   . Sciatica     Past Surgical History:  Procedure Laterality Date  . FEMUR IM NAIL Right 11/21/2018   Procedure: INTRAMEDULLARY (IM) NAIL FEMORAL;  Surgeon: Renette Butters, MD;  Location: [facility];  Service: Orthopedics;  Laterality: Right;  . ROTATOR CUFF REPAIR Right     Family History  Problem Relation Age of Onset  . Heart failure Father     Social History:  reports that she has never smoked. She has never used smokeless tobacco. She reports that she does not drink alcohol or use drugs.  Allergies:  Allergies  Allergen Reactions  . Keflex [Cephalexin] Diarrhea and Other (See Comments)    Headaches and dizziness also     Medications:  No current facility-administered medications on file prior to  encounter.    Current Outpatient Medications on File Prior to Encounter  Medication Sig Dispense Refill  . aspirin 325 MG tablet Take 325 mg by mouth daily.    . metoprolol succinate (TOPROL-XL) 25 MG 24 hr tablet Take 1 tablet (25 mg total) by mouth daily.    Marland Kitchen enoxaparin (LOVENOX) 30 MG/0.3ML injection Inject 0.3 mLs (30 mg total) into the skin daily. (Patient not taking: Reported on 08/10/2019) 30 Syringe 0    ROS: Constitutional: No fever or chills Vision: No changes in vision ENT: No difficulty swallowing CV: No chest pain Pulm: No SOB or wheezing GI: No nausea or vomiting GU: No urgency or inability to hold urine Skin: No poor wound healing Neurologic: No numbness or tingling Psychiatric: No depression or anxiety Heme: No bruising Allergic: No reaction to medications or food   Exam: Blood pressure (!) 130/51, pulse 70, temperature 98.2 F (36.8 C), temperature source Oral, resp. rate 15, height 5\' 4"  (1.626 m), weight 63.5 kg, SpO2 93 %. General: No acute distress Orientation: Awake alert and oriented x3 Mood and Affect: Cooperative and pleasant Gait: Unable to stand or ambulate secondary to her fracture Coordination and balance: Within normal limits No increased work of breathing Regular rate and rhythm  Right lower extremity: Dressing is in place a knee immobilizer is in place.  Did not manipulate or move the leg due to her fracture.  Compartments are soft and compressible in the lower leg.  She has active dorsiflexion plantarflexion.  She has sensation intact light touch the dorsum and plantar to her foot.  She has a warm well-perfused foot with palpable pulses.  Left lower extremity: skin without lesions. No tenderness to palpation. Full painless ROM, full strength in each muscle groups without evidence of instability.   Medical Decision Making: Imaging: X-rays of the right knee/femur show a inter-periprosthetic distal femur fracture with a mild amount of  displacement.  Labs:  Results for orders placed or performed during the hospital encounter of 08/10/19 (from the past 48 hour(s))  SARS CORONAVIRUS 2 (TAT 6-12 HRS) Nasal Swab Aptima Multi Swab     Status: None   Collection Time: 08/10/19  6:08 AM   Specimen: Aptima Multi Swab; Nasal Swab  Result Value Ref Range   SARS Coronavirus 2 NEGATIVE NEGATIVE    Comment: (NOTE) SARS-CoV-2 target nucleic acids are NOT DETECTED. The SARS-CoV-2 RNA is generally detectable in upper and lower respiratory specimens during the acute phase of infection. Negative results do not preclude SARS-CoV-2 infection, do not rule out co-infections with other pathogens, and should not be used as the sole basis for treatment or other patient management decisions. Negative results must be combined with clinical observations, patient history, and epidemiological information. The expected result is Negative. Fact Sheet for Patients: SugarRoll.be Fact Sheet for Healthcare Providers: https://www.woods-mathews.com/ This test is not yet approved or cleared by the [place] FDA and  has been authorized for detection and/or diagnosis of SARS-CoV-2 by FDA under an Emergency Use Authorization (EUA). This EUA will remain  in effect (meaning this test can be used) for the duration of the COVID-19 declaration under Section 56 4(b)(1) of the Act, 21 U.S.C. section 360bbb-3(b)(1), unless the authorization is terminated or revoked sooner. Performed at [facility], [place] [address], [place], [place] [address]   Type and screen [place]     Status: None   Collection Time: 08/10/19  7:12 AM  Result Value Ref Range   ABO/RH(D) A POS    Antibody Screen NEG    Sample Expiration      08/13/2019,2359 Performed at [facility], [place] [address] [place], [place] [address]   CBC with Differential/Platelet     Status: None   Collection Time: 08/10/19  7:22  AM  Result Value Ref Range   WBC  4.0 - 10.5 K/uL    QUESTIONABLE IDENTIFICATION / INCORRECTLY LABELED SPECIMEN    Comment: PER CINDY ROUGHGARDEN,RN AT 0825 08/10/2019 BY ZBEECH. CORRECTED ON 08/25 AT 0831: PREVIOUSLY REPORTED AS 13.2    RBC  3.87 - 5.11 MIL/uL    QUESTIONABLE IDENTIFICATION / INCORRECTLY LABELED SPECIMEN    Comment: PER CINDY ROUGHGARDEN,RN AT 0825 08/10/2019 BY ZBEECH. CORRECTED ON 08/25 AT 0831: PREVIOUSLY REPORTED AS 3.11    Hemoglobin  12.0 - 15.0 g/dL    QUESTIONABLE IDENTIFICATION / INCORRECTLY LABELED SPECIMEN    Comment: PER CINDY ROUGHGARDEN,RN AT 0825 08/10/2019 BY ZBEECH. CORRECTED ON 08/25 AT 0831: PREVIOUSLY REPORTED AS 9.1    HCT  36.0 - 46.0 %    QUESTIONABLE IDENTIFICATION / INCORRECTLY LABELED SPECIMEN    Comment: PER CINDY ROUGHGARDEN,RN AT 0825 08/10/2019 BY ZBEECH. CORRECTED ON 08/25 AT 0831: PREVIOUSLY REPORTED AS 31.3    MCV  80.0 - 100.0 fL    QUESTIONABLE IDENTIFICATION / INCORRECTLY LABELED SPECIMEN    Comment: PER CINDY ROUGHGARDEN,RN AT 0825 08/10/2019 BY ZBEECH. CORRECTED ON 08/25 AT 0831: PREVIOUSLY REPORTED AS 100.6  MCH  26.0 - 34.0 pg    QUESTIONABLE IDENTIFICATION / INCORRECTLY LABELED SPECIMEN    Comment: PER CINDY ROUGHGARDEN,RN AT 0825 08/10/2019 BY ZBEECH. CORRECTED ON 08/25 AT 0831: PREVIOUSLY REPORTED AS 29.3    MCHC  30.0 - 36.0 g/dL    QUESTIONABLE IDENTIFICATION / INCORRECTLY LABELED SPECIMEN    Comment: PER CINDY ROUGHGARDEN,RN AT 0825 08/10/2019 BY ZBEECH. CORRECTED ON 08/25 AT 0831: PREVIOUSLY REPORTED AS 29.1    RDW  11.5 - 15.5 %    QUESTIONABLE IDENTIFICATION / INCORRECTLY LABELED SPECIMEN    Comment: PER CINDY ROUGHGARDEN,RN AT 0825 08/10/2019 BY ZBEECH. CORRECTED ON 08/25 AT 0831: PREVIOUSLY REPORTED AS 13.9    Platelets  150 - 400 K/uL    QUESTIONABLE IDENTIFICATION / INCORRECTLY LABELED SPECIMEN    Comment: PER CINDY ROUGHGARDEN,RN AT 0825 08/10/2019 BY ZBEECH. CORRECTED ON 08/25 AT 0831: PREVIOUSLY  REPORTED AS 210    nRBC  0.0 - 0.2 %    QUESTIONABLE IDENTIFICATION / INCORRECTLY LABELED SPECIMEN    Comment: PER CINDY ROUGHGARDEN,RN AT 0825 08/10/2019 BY ZBEECH. CORRECTED ON 08/25 AT 0831: PREVIOUSLY REPORTED AS 0.0    Neutrophils Relative %  %    QUESTIONABLE IDENTIFICATION / INCORRECTLY LABELED SPECIMEN    Comment: PER CINDY ROUGHGARDEN,RN AT 0825 08/10/2019 BY ZBEECH. CORRECTED ON 08/25 AT 0831: PREVIOUSLY REPORTED AS 84    Neutro Abs  1.7 - 7.7 K/uL    QUESTIONABLE IDENTIFICATION / INCORRECTLY LABELED SPECIMEN    Comment: PER CINDY ROUGHGARDEN,RN AT 0825 08/10/2019 BY ZBEECH. CORRECTED ON 08/25 AT 0831: PREVIOUSLY REPORTED AS 11.0    Band Neutrophils  %    QUESTIONABLE IDENTIFICATION / INCORRECTLY LABELED SPECIMEN    Comment: PER CINDY ROUGHGARDEN,RN AT 0825 08/10/2019 BY ZBEECH.   Lymphocytes Relative  %    QUESTIONABLE IDENTIFICATION / INCORRECTLY LABELED SPECIMEN    Comment: PER CINDY ROUGHGARDEN,RN AT 0825 08/10/2019 BY ZBEECH. CORRECTED ON 08/25 AT 0831: PREVIOUSLY REPORTED AS 11    Lymphs Abs  0.7 - 4.0 K/uL    QUESTIONABLE IDENTIFICATION / INCORRECTLY LABELED SPECIMEN    Comment: PER CINDY ROUGHGARDEN,RN AT 0825 08/10/2019 BY ZBEECH. CORRECTED ON 08/25 AT 0831: PREVIOUSLY REPORTED AS 1.5    Monocytes Relative  %    QUESTIONABLE IDENTIFICATION / INCORRECTLY LABELED SPECIMEN    Comment: PER CINDY ROUGHGARDEN,RN AT 0825 08/10/2019 BY ZBEECH. CORRECTED ON 08/25 AT 0831: PREVIOUSLY REPORTED AS 5    Monocytes Absolute  0.1 - 1.0 K/uL    QUESTIONABLE IDENTIFICATION / INCORRECTLY LABELED SPECIMEN    Comment: PER CINDY ROUGHGARDEN,RN AT 0825 08/10/2019 BY ZBEECH. CORRECTED ON 08/25 AT 0831: PREVIOUSLY REPORTED AS 0.7    Eosinophils Relative  %    QUESTIONABLE IDENTIFICATION / INCORRECTLY LABELED SPECIMEN    Comment: PER CINDY ROUGHGARDEN,RN AT 0825 08/10/2019 BY ZBEECH. CORRECTED ON 08/25 AT 0831: PREVIOUSLY REPORTED AS 0    Eosinophils Absolute  0.0 - 0.5 K/uL     QUESTIONABLE IDENTIFICATION / INCORRECTLY LABELED SPECIMEN    Comment: PER CINDY ROUGHGARDEN,RN AT 0825 08/10/2019 BY ZBEECH. CORRECTED ON 08/25 AT 0831: PREVIOUSLY REPORTED AS 0.0    Basophils Relative  %    QUESTIONABLE IDENTIFICATION / INCORRECTLY LABELED SPECIMEN    Comment: PER CINDY ROUGHGARDEN,RN AT 0825 08/10/2019 BY ZBEECH. CORRECTED ON 08/25 AT 0831: PREVIOUSLY REPORTED AS 0    Basophils Absolute  0.0 - 0.1 K/uL    QUESTIONABLE IDENTIFICATION / INCORRECTLY LABELED SPECIMEN    Comment: PER CINDY ROUGHGARDEN,RN AT 0825 08/10/2019 BY ZBEECH. CORRECTED ON  08/25 AT 0831: PREVIOUSLY REPORTED AS 0.0    WBC Morphology      QUESTIONABLE IDENTIFICATION / INCORRECTLY LABELED SPECIMEN    Comment: PER CINDY ROUGHGARDEN,RN AT 0825 08/10/2019 BY ZBEECH.   RBC Morphology      QUESTIONABLE IDENTIFICATION / INCORRECTLY LABELED SPECIMEN    Comment: PER CINDY ROUGHGARDEN,RN AT 0825 08/10/2019 BY ZBEECH.   Smear Review      QUESTIONABLE IDENTIFICATION / INCORRECTLY LABELED SPECIMEN    Comment: PER CINDY ROUGHGARDEN,RN AT 0825 08/10/2019 BY ZBEECH.   Other  %    QUESTIONABLE IDENTIFICATION / INCORRECTLY LABELED SPECIMEN    Comment: PER CINDY ROUGHGARDEN,RN AT 0825 08/10/2019 BY ZBEECH.   nRBC  0 /100 WBC    QUESTIONABLE IDENTIFICATION / INCORRECTLY LABELED SPECIMEN    Comment: PER CINDY ROUGHGARDEN,RN AT 0825 08/10/2019 BY ZBEECH.   Metamyelocytes Relative  %    QUESTIONABLE IDENTIFICATION / INCORRECTLY LABELED SPECIMEN    Comment: PER CINDY ROUGHGARDEN,RN AT 0825 08/10/2019 BY ZBEECH.   Myelocytes  %    QUESTIONABLE IDENTIFICATION / INCORRECTLY LABELED SPECIMEN    Comment: PER CINDY ROUGHGARDEN,RN AT 0825 08/10/2019 BY ZBEECH.   Promyelocytes Relative  %    QUESTIONABLE IDENTIFICATION / INCORRECTLY LABELED SPECIMEN    Comment: PER CINDY ROUGHGARDEN,RN AT 0825 08/10/2019 BY ZBEECH.   Blasts  %    QUESTIONABLE IDENTIFICATION / INCORRECTLY LABELED SPECIMEN    Comment: PER CINDY  ROUGHGARDEN,RN AT 0825 08/10/2019 BY ZBEECH.   Immature Granulocytes  %    QUESTIONABLE IDENTIFICATION / INCORRECTLY LABELED SPECIMEN    Comment: PER CINDY ROUGHGARDEN,RN AT 0825 08/10/2019 BY ZBEECH. CORRECTED ON 08/25 AT 0831: PREVIOUSLY REPORTED AS 0    Abs Immature Granulocytes  0.00 - 0.07 K/uL    QUESTIONABLE IDENTIFICATION / INCORRECTLY LABELED SPECIMEN    Comment: PER CINDY ROUGHGARDEN,RN AT 0825 08/10/2019 BY ZBEECH. Performed at [facility], [place] [address] [place], [place] [address] CORRECTED ON 08/25 AT 0831: PREVIOUSLY REPORTED AS [id number]   Basic metabolic panel     Status: None   Collection Time: 08/10/19  7:22 AM  Result Value Ref Range   Sodium  135 - 145 mmol/L    QUESTIONABLE IDENTIFICATION / INCORRECTLY LABELED SPECIMEN    Comment: PER CINDY ROUGHGARDEN,RN AT 0825 08/10/2019 BY ZBEECH. CORRECTED ON 08/25 AT 0831: PREVIOUSLY REPORTED AS 124    Potassium  3.5 - 5.1 mmol/L    QUESTIONABLE IDENTIFICATION / INCORRECTLY LABELED SPECIMEN    Comment: PER CINDY ROUGHGARDEN,RN AT 0825 08/10/2019 BY ZBEECH. CORRECTED ON 08/25 AT 0831: PREVIOUSLY REPORTED AS 3.4    Chloride  98 - 111 mmol/L    QUESTIONABLE IDENTIFICATION / INCORRECTLY LABELED SPECIMEN    Comment: PER CINDY ROUGHGARDEN,RN AT 0825 08/10/2019 BY ZBEECH. CORRECTED ON 08/25 AT 0831: PREVIOUSLY REPORTED AS 87    CO2  22 - 32 mmol/L    QUESTIONABLE IDENTIFICATION / INCORRECTLY LABELED SPECIMEN    Comment: PER CINDY ROUGHGARDEN,RN AT 0825 08/10/2019 BY ZBEECH. CORRECTED ON 08/25 AT 0831: PREVIOUSLY REPORTED AS 23    Glucose, Bld  70 - 99 mg/dL    QUESTIONABLE IDENTIFICATION / INCORRECTLY LABELED SPECIMEN    Comment: PER CINDY ROUGHGARDEN,RN AT 0825 08/10/2019 BY ZBEECH. CORRECTED ON 08/25 AT 0831: PREVIOUSLY REPORTED AS 809 CRITICAL RESULT CALLED TO, READ BACK BY AND VERIFIED WITH: CINDY ROUGHGARDEN,RN AT [id number] 08/10/2019 BY ZBEECH.    BUN  8 - 23 mg/dL    QUESTIONABLE IDENTIFICATION / INCORRECTLY LABELED  SPECIMEN    Comment: PER  CINDY ROUGHGARDEN,RN AT 0825 08/10/2019 BY ZBEECH. CORRECTED ON 08/25 AT 0831: PREVIOUSLY REPORTED AS 20    Creatinine, Ser  0.44 - 1.00 mg/dL    QUESTIONABLE IDENTIFICATION / INCORRECTLY LABELED SPECIMEN    Comment: PER CINDY ROUGHGARDEN,RN AT 0825 08/10/2019 BY ZBEECH. CORRECTED ON 08/25 AT 0831: PREVIOUSLY REPORTED AS 0.98    Calcium  8.9 - 10.3 mg/dL    QUESTIONABLE IDENTIFICATION / INCORRECTLY LABELED SPECIMEN    Comment: PER CINDY ROUGHGARDEN,RN AT 0825 08/10/2019 BY ZBEECH. CORRECTED ON 08/25 AT 0831: PREVIOUSLY REPORTED AS 7.3    GFR calc non Af Amer  >60 mL/min    QUESTIONABLE IDENTIFICATION / INCORRECTLY LABELED SPECIMEN    Comment: PER CINDY ROUGHGARDEN,RN AT 0825 08/10/2019 BY ZBEECH. CORRECTED ON 08/25 AT 0831: PREVIOUSLY REPORTED AS 47    GFR calc Af Amer  >60 mL/min    QUESTIONABLE IDENTIFICATION / INCORRECTLY LABELED SPECIMEN    Comment: PER CINDY ROUGHGARDEN,RN AT 0825 08/10/2019 BY ZBEECH. CORRECTED ON 08/25 AT 0831: PREVIOUSLY REPORTED AS 54    Anion gap  5 - 15    QUESTIONABLE IDENTIFICATION / INCORRECTLY LABELED SPECIMEN    Comment: PER CINDY ROUGHGARDEN,RN AT 0825 08/10/2019 BY ZBEECH. Performed at [facility], [place] [address] [place], [place] [address] CORRECTED ON 08/25 AT 0831: PREVIOUSLY REPORTED AS 14   CBG monitoring, ED     Status: Abnormal   Collection Time: 08/10/19  8:01 AM  Result Value Ref Range   Glucose-Capillary 134 (H) 70 - 99 mg/dL  CBC with Differential/Platelet     Status: Abnormal   Collection Time: 08/10/19  8:04 AM  Result Value Ref Range   WBC 14.9 (H) 4.0 - 10.5 K/uL   RBC 3.50 (L) 3.87 - 5.11 MIL/uL   Hemoglobin 10.3 (L) 12.0 - 15.0 g/dL   HCT 33.4 (L) 36.0 - 46.0 %   MCV 95.4 80.0 - 100.0 fL   MCH 29.4 26.0 - 34.0 pg   MCHC 30.8 30.0 - 36.0 g/dL   RDW 13.5 11.5 - 15.5 %   Platelets 248 150 - 400 K/uL   nRBC 0.0 0.0 - 0.2 %   Neutrophils Relative % 87 %   Neutro Abs 12.9 (H) 1.7 - 7.7 K/uL    Lymphocytes Relative 8 %   Lymphs Abs 1.2 0.7 - 4.0 K/uL   Monocytes Relative 5 %   Monocytes Absolute 0.8 0.1 - 1.0 K/uL   Eosinophils Relative 0 %   Eosinophils Absolute 0.0 0.0 - 0.5 K/uL   Basophils Relative 0 %   Basophils Absolute 0.1 0.0 - 0.1 K/uL   Immature Granulocytes 0 %   Abs Immature Granulocytes 0.05 0.00 - 0.07 K/uL    Comment: Performed at [facility], [address], [place], [place] [address]  Comprehensive metabolic panel     Status: Abnormal   Collection Time: 08/10/19  8:04 AM  Result Value Ref Range   Sodium 141 135 - 145 mmol/L   Potassium 3.9 3.5 - 5.1 mmol/L   Chloride 105 98 - 111 mmol/L   CO2 27 22 - 32 mmol/L   Glucose, Bld 165 (H) 70 - 99 mg/dL   BUN 23 8 - 23 mg/dL   Creatinine, Ser 1.04 (H) 0.44 - 1.00 mg/dL   Calcium 8.4 (L) 8.9 - 10.3 mg/dL   Total Protein 5.6 (L) 6.5 - 8.1 g/dL   Albumin 3.3 (L) 3.5 - 5.0 g/dL   AST 17 15 - 41 U/L  ALT 13 0 - 44 U/L   Alkaline Phosphatase 70 38 - 126 U/L   Total Bilirubin 0.7 0.3 - 1.2 mg/dL   GFR calc non Af Amer 44 (L) >60 mL/min   GFR calc Af Amer 51 (L) >60 mL/min   Anion gap 9 5 - 15    Comment: Performed at [facility] [address], [place], [place] [address]  Protime-INR     Status: None   Collection Time: 08/10/19  8:04 AM  Result Value Ref Range   Prothrombin Time 15.0 11.4 - 15.2 seconds   INR 1.2 0.8 - 1.2    Comment: (NOTE) INR goal varies based on device and disease states. Performed at [facility], [place] [address], [place], [place] [address]   MRSA PCR Screening     Status: Abnormal   Collection Time: 08/11/19  3:07 AM   Specimen: Nasal Mucosa; Nasopharyngeal  Result Value Ref Range   MRSA by PCR POSITIVE (A) NEGATIVE    Comment:        The GeneXpert MRSA Assay (FDA approved for NASAL specimens only), is one component of a comprehensive MRSA colonization surveillance program. It is not intended to diagnose MRSA infection nor to guide or monitor treatment  for MRSA infections. ATAMBILE,J RN 08/11/2019 AT [id number] SKEEN,P Performed at [facility], [place] [address] [place], [place] [address]   CBC     Status: Abnormal   Collection Time: 08/11/19  4:14 AM  Result Value Ref Range   WBC 8.8 4.0 - 10.5 K/uL   RBC 2.88 (L) 3.87 - 5.11 MIL/uL   Hemoglobin 8.4 (L) 12.0 - 15.0 g/dL   HCT 27.3 (L) 36.0 - 46.0 %   MCV 94.8 80.0 - 100.0 fL   MCH 29.2 26.0 - 34.0 pg   MCHC 30.8 30.0 - 36.0 g/dL   RDW 13.7 11.5 - 15.5 %   Platelets 207 150 - 400 K/uL   nRBC 0.0 0.0 - 0.2 %    Comment: Performed at [facility], [place] [address], [place], [place] [id number]  Basic metabolic panel     Status: Abnormal   Collection Time: 08/11/19  4:14 AM  Result Value Ref Range   Sodium 138 135 - 145 mmol/L   Potassium 4.1 3.5 - 5.1 mmol/L   Chloride 102 98 - 111 mmol/L   CO2 28 22 - 32 mmol/L   Glucose, Bld 123 (H) 70 - 99 mg/dL   BUN 27 (H) 8 - 23 mg/dL   Creatinine, Ser 1.18 (H) 0.44 - 1.00 mg/dL   Calcium 8.1 (L) 8.9 - 10.3 mg/dL   GFR calc non Af Amer 38 (L) >60 mL/min   GFR calc Af Amer 44 (L) >60 mL/min   Anion gap 8 5 - 15    Comment: Performed at [facility] [address] [place], [place] [address]    Medical history and chart was reviewed  Assessment/Plan: 85 year old female status post ground-level fall with intra-prosthetic distal femur fracture  Recommend proceeding with open reduction internal fixation along with irrigation debridement of her knee laceration.  Risks and benefits were discussed with the patient and her son. Risks discussed included bleeding requiring blood transfusion, bleeding causing a hematoma, infection, malunion, nonunion, damage to surrounding nerves and blood vessels, pain, hardware prominence or irritation, hardware failure, stiffness, post-traumatic arthritis, DVT/PE, compartment syndrome, and even death.  She agrees to proceed with surgery.  Shona Needles, MD  [facility] [phone] (phone) [phone] (office) [email or web address]

## 2019-08-11 NOTE — Progress Notes (Signed)
Progress Note   Vanessa Knox [id number] DOB: 1916/04/23 DOA: 08/10/2019  PCP: Leanna Battles, MD Consultants:  Percell Miller - orthopedics Patient coming from: [place]; NOK: Melida Gimenez [place], [phone]  Chief Complaint: fall  HPI: Vanessa Knox is a 85 y.o. female with medical history significant of HTN and spinal stenosis presenting with a mechanical fall.  She also fell in Dec, resulting in a R intertrochanteric hip fracture.  She was discharged to [facility] for rehab and returned back home again.  Today, she got up to use the bathroom and slipped, causing her walker to pull her down.  She had a head laceration as well as R knee injury.  No LOC.  Felt well prior to the fall.  In ED patient was found to have supracondylar femur fracture. Ortho (Dr. Percell Miller) planning for OR this morning.   Subjective: No acute issues or events overnight, pain well controlled, patient somewhat anxious for surgery today, son at bedside.  Denies chest pain, shortness of breath, nausea, vomiting, diarrhea, constipation, headache, fevers, chills.   Assessment/Plan Principal Problem:   Supracondylar fracture of femur, right, open type I or II, initial encounter [facility]) Active Problems:   HTN (hypertension)   Supracondylar open R femur fracture s/p fall -Mechanical fall resulting in open R distal femur fracture; patient with proximal femoral nail from 12/19 injury as well as total arthroplasty of the R knee -Orthopedics consulted, will take patient to the OR later today -NPO in anticipation of surgical repair -SCD on left, foot pump on the right -Pain control with Robxain, Vicodin, and Morphine prn -SW consult for rehab placement -Will need PT evaluation postop, likely disposition to SNF  HTN, essential, well-controlled -Continue Toprol XL   DVT prophylaxis:  SCDs until approved for coagulation by orthopedics Code Status:  DNR - confirmed with patient Family Communication: Son  at bedside, all questions answered Disposition Plan: Pending clinical status and postsurgical ambulatory status Consults called: Orthopedics; SW, Nutrition; will need PT post-operatively  Admission status: Admit - It is my clinical opinion that admission to [place] is reasonable and necessary because of the expectation that this patient will require hospital care that crosses at least 2 midnights to treat this condition based on the medical complexity of the problems presented.  Given the aforementioned information, the predictability of an adverse outcome is felt to be significant.  Ambulatory Status:  Ambulates with a walker prior to admission  Physical Exam: Vitals:   08/10/19 1108 08/10/19 1533 08/10/19 2048 08/11/19 0543  BP:  127/64 (!) 137/56 (!) 149/68  Pulse:  80 74 80  Resp:      Temp:  (!) 97.3 F (36.3 C) 98 F (36.7 C) 98.4 F (36.9 C)  TempSrc:  Oral Oral Oral  SpO2:  97% 97% 95%  Weight: 63.5 kg     Height: 5\' 4"  (1.626 m)        . General:  Appears calm and comfortable and is NAD; she has numerous bandages from her injuries and blood stains on her person and the sheets . Eyes:  PERRL, EOMI, normal lids, iris . ENT:  grossly normal hearing, lips & tongue, mmm; appropriate dentition . Neck:  no LAD, masses or thyromegaly . Cardiovascular:  RRR, no m/r/g. No LE edema.  Marland Kitchen Respiratory:   CTA bilaterally with no wheezes/rales/rhonchi.  Normal respiratory effort. . Abdomen:  soft, NT, ND, NABS . Skin:  Complicated knee laceration on the R as well as scalp laceration . Musculoskeletal:  R leg with shortening and external rotation . Lower extremity:  No LE edema.  Limited foot exam with no ulcerations.  2+ distal pulses. Marland Kitchen Psychiatric:  grossly normal mood and affect, speech fluent and appropriate, AOx3 . Neurologic:  CN 2-12 grossly intact, moves all extremities in coordinated fashion (sparing RLE due to pain), sensation intact          Radiological Exams on  Admission: Ct Head Wo Contrast  Result Date: 08/10/2019 CLINICAL DATA:  Fall with head trauma EXAM: CT HEAD WITHOUT CONTRAST CT CERVICAL SPINE WITHOUT CONTRAST TECHNIQUE: Multidetector CT imaging of the head and cervical spine was performed following the standard protocol without intravenous contrast. Multiplanar CT image reconstructions of the cervical spine were also generated. COMPARISON:  Brain MRI 07/15/2017 FINDINGS: CT HEAD FINDINGS Brain: No evidence of acute infarction, hemorrhage, hydrocephalus, extra-axial collection or mass lesion/mass effect. Age congruent volume loss and white matter appearance Vascular: No hyperdense vessel or unexpected calcification. Skull: High right scalp laceration without calvarial fracture. Sinuses/Orbits: No evidence of injury. Bilateral cataract resection. CT CERVICAL SPINE FINDINGS Alignment: No traumatic malalignment. Degenerative appearing C3-4, C4-5, and C7-T1 anterolisthesis. Skull base and vertebrae: Negative for acute fracture. Soft tissues and spinal canal: No prevertebral fluid or swelling. No visible canal hematoma. Bilateral parotid calculi. Disc levels: Disc degeneration focally advanced at C5-6 and C6-7. Generalized degenerative facet spurring. Upper chest: Negative IMPRESSION: 1. No evidence of acute intracranial injury or cervical spine fracture. 2. Rights scalp laceration without calvarial fracture. Electronically Signed   By: Monte Fantasia M.D.   On: 08/10/2019 05:48   Ct Cervical Spine Wo Contrast  Result Date: 08/10/2019 CLINICAL DATA:  Fall with head trauma EXAM: CT HEAD WITHOUT CONTRAST CT CERVICAL SPINE WITHOUT CONTRAST TECHNIQUE: Multidetector CT imaging of the head and cervical spine was performed following the standard protocol without intravenous contrast. Multiplanar CT image reconstructions of the cervical spine were also generated. COMPARISON:  Brain MRI 07/15/2017 FINDINGS: CT HEAD FINDINGS Brain: No evidence of acute infarction,  hemorrhage, hydrocephalus, extra-axial collection or mass lesion/mass effect. Age congruent volume loss and white matter appearance Vascular: No hyperdense vessel or unexpected calcification. Skull: High right scalp laceration without calvarial fracture. Sinuses/Orbits: No evidence of injury. Bilateral cataract resection. CT CERVICAL SPINE FINDINGS Alignment: No traumatic malalignment. Degenerative appearing C3-4, C4-5, and C7-T1 anterolisthesis. Skull base and vertebrae: Negative for acute fracture. Soft tissues and spinal canal: No prevertebral fluid or swelling. No visible canal hematoma. Bilateral parotid calculi. Disc levels: Disc degeneration focally advanced at C5-6 and C6-7. Generalized degenerative facet spurring. Upper chest: Negative IMPRESSION: 1. No evidence of acute intracranial injury or cervical spine fracture. 2. Rights scalp laceration without calvarial fracture. Electronically Signed   By: Monte Fantasia M.D.   On: 08/10/2019 05:48   Ct Knee Right Wo Contrast  Result Date: 08/10/2019 CLINICAL DATA:  The trauma with tibial plateau fracture. EXAM: CT OF THE right KNEE WITHOUT CONTRAST TECHNIQUE: Multidetector CT imaging of the right knee was performed according to the standard protocol. Multiplanar CT image reconstructions were also generated. COMPARISON:  None. FINDINGS: Oblique fracture through the supracondylar femur, lateral and posterior eccentric, interposed between a femoral nail and the femoral component of a total knee arthroplasty. In addition to the dominant oblique fracture plane there is a small cortical fracture extending cranially through the posterior cortex of the distal femur, seem markings on coronal reformats. No displacement. No additional fracture noted. The distal interlocking screw of the femoral nail is fractured, likely on a  chronic basis. Regional soft tissue swelling with small joint effusion. Prominent osteopenia. IMPRESSION: Oblique supracondylar femur fracture  interposed between a femoral nail and total knee arthroplasty. Electronically Signed   By: Monte Fantasia M.D.   On: 08/10/2019 05:54   Dg Pelvis Portable  Result Date: 08/10/2019 CLINICAL DATA:  Fall EXAM: PORTABLE PELVIS 1-2 VIEWS COMPARISON:  None. FINDINGS: Remote intertrochanteric right femur fracture with healing after fixation. Remote left obturator ring and right inferior pubic ramus fractures. No evidence of acute fracture. Osteopenia is profound. Lower lumbar degenerative disease with probable L5 vertebroplasty. IMPRESSION: 1. No acute finding. 2. Remote pelvic and right proximal femoral fractures. Electronically Signed   By: Monte Fantasia M.D.   On: 08/10/2019 06:01   Dg Chest Portable 1 View  Result Date: 08/10/2019 CLINICAL DATA:  Fall EXAM: PORTABLE CHEST 1 VIEW COMPARISON:  11/20/2018 FINDINGS: Normal heart size and stable mediastinal contours. Chronic mild interstitial coarsening. There is no edema, consolidation, effusion, or pneumothorax. Postoperative right axilla. Prominent osteopenia and bilateral shoulder osteoarthritis. No evidence of acute fracture. IMPRESSION: Stable from prior.  No acute finding. Electronically Signed   By: Monte Fantasia M.D.   On: 08/10/2019 06:00   Dg Knee Right Port  Result Date: 08/10/2019 CLINICAL DATA:  History of fall. EXAM: PORTABLE RIGHT KNEE - 1-2 VIEW COMPARISON:  None. FINDINGS: Two views of the right knee were obtained. There is a total knee arthroplasty and a femoral intramedullary nail that extends into the distal femur with an interlocking screw. There is a posterior displaced fracture involving the distal femur. The fracture appears to be at or just below the distal aspect of the intramedullary nail. The fracture is at least mildly comminuted. Knee arthroplasty is located. Interlocking screw in the intramedullary nail appears to be fractured. There is callus formation involving the distal femur compatible with an old injury. Probable joint  effusion with lipohemarthrosis. IMPRESSION: Displaced fracture involving the distal femur. Fracture appears to be situated between the intramedullary nail and the femoral prosthesis of the knee arthroplasty. Knee arthroplasty is located. Electronically Signed   By: Markus Daft M.D.   On: 08/10/2019 08:39    Little Ishikawa DO [facility]  08/11/2019, 7:33 AM

## 2019-08-11 NOTE — Progress Notes (Signed)
Late Entry:@ 0000 Nurse had to rinse patient hair with peroxide chloride  And later normal saline solution because the old blood drainage from the scalp  laceration was dried, making the hair  And scalp stiff and difficult to detatngle , lots of blood stains came out but there are still more, nurse will recommend the scalp laceration to heal first before washing the hair to get rid of the rest of the blood stains, pain meds given , NPO, CHG bath done MRSA by PCR positive,  Will continue to monitor.

## 2019-08-11 NOTE — Anesthesia Procedure Notes (Signed)
Procedure Name: LMA Insertion Date/Time: 08/11/2019 12:07 PM Performed by: Jearld Pies, CRNA Pre-anesthesia Checklist: Patient identified, Emergency Drugs available, Suction available and Patient being monitored Patient Re-evaluated:Patient Re-evaluated prior to induction Oxygen Delivery Method: Circle System Utilized Preoxygenation: Pre-oxygenation with 100% oxygen Induction Type: IV induction Ventilation: Mask ventilation without difficulty LMA: LMA inserted LMA Size: 4.0 Number of attempts: 1 Airway Equipment and Method: Bite block Placement Confirmation: positive ETCO2 Tube secured with: Tape Dental Injury: Teeth and Oropharynx as per pre-operative assessment

## 2019-08-12 ENCOUNTER — Encounter (HOSPITAL_COMMUNITY): Payer: Self-pay | Admitting: Student

## 2019-08-12 LAB — CBC
HCT: 24.8 % — ABNORMAL LOW (ref 36.0–46.0)
Hemoglobin: 7.7 g/dL — ABNORMAL LOW (ref 12.0–15.0)
MCH: 28.7 pg (ref 26.0–34.0)
MCHC: 31 g/dL (ref 30.0–36.0)
MCV: 92.5 fL (ref 80.0–100.0)
Platelets: 198 10*3/uL (ref 150–400)
RBC: 2.68 MIL/uL — ABNORMAL LOW (ref 3.87–5.11)
RDW: 13.4 % (ref 11.5–15.5)
WBC: 9.3 10*3/uL (ref 4.0–10.5)
nRBC: 0 % (ref 0.0–0.2)

## 2019-08-12 LAB — BASIC METABOLIC PANEL
Anion gap: 8 (ref 5–15)
BUN: 21 mg/dL (ref 8–23)
CO2: 28 mmol/L (ref 22–32)
Calcium: 8 mg/dL — ABNORMAL LOW (ref 8.9–10.3)
Chloride: 102 mmol/L (ref 98–111)
Creatinine, Ser: 0.97 mg/dL (ref 0.44–1.00)
GFR calc Af Amer: 55 mL/min — ABNORMAL LOW (ref >60)
GFR calc non Af Amer: 48 mL/min — ABNORMAL LOW (ref >60)
Glucose, Bld: 113 mg/dL — ABNORMAL HIGH (ref 70–99)
Potassium: 4.3 mmol/L (ref 3.5–5.1)
Sodium: 138 mmol/L (ref 135–145)

## 2019-08-12 LAB — VITAMIN D 25 HYDROXY (VIT D DEFICIENCY, FRACTURES): Vit D, 25-Hydroxy: 31.7 ng/mL (ref 30.0–100.0)

## 2019-08-12 MED ORDER — VITAMIN D 25 MCG (1000 UNIT) PO TABS
2000.0000 [IU] | ORAL_TABLET | Freq: Every day | ORAL | Status: DC
  Administered 2019-08-12 – 2019-08-17 (×6): 2000 [IU] via ORAL
  Filled 2019-08-12 (×6): qty 2

## 2019-08-12 NOTE — Evaluation (Signed)
Physical Therapy Evaluation Patient Details Name: Vanessa Knox MRN: [id number] DOB: [birth year] Today's Date: 08/12/2019   History of Present Illness  Pt is a 85 y.o. female admitted from independent living facility on 08/10/19 after fall; pt sustained forehead lacerations and R distal femur fx s/p ORIF 8/26. PMH includes HTN, sciatic, R rotator cuff repair; previous R femur IM nail (11/2018).    Clinical Impression  Pt presents with an overall decrease in functional mobility secondary to above. PTA, pt resides at ILF, uses rollator and electric scooter, reports indep with most ADLs. Today, pt requiring mod-maxA to initiate transfer and gait training with RW; pt limited by pain, generalized weakness and very fearful of falling. Pt would benefit from continued acute PT services to maximize functional mobility and independence prior to d/c with SNF-level therapies. Pt hopeful to return to ILF instead of needing SNF.     Follow Up Recommendations SNF;Supervision/Assistance - 24 hour    Equipment Recommendations  Rolling walker with 5" wheels    Recommendations for Other Services       Precautions / Restrictions Precautions Precautions: Fall Restrictions Weight Bearing Restrictions: Yes RLE Weight Bearing: Weight bearing as tolerated      Mobility  Bed Mobility Overal bed mobility: Needs Assistance Bed Mobility: Supine to Sit     Supine to sit: Max assist;HOB elevated     General bed mobility comments: MaxA to scoot hips to EOB and elevate trunk, pt initiating movement well with cues and use of bed rail  Transfers Overall transfer level: Needs assistance Equipment used: Rolling walker (2 wheeled) Transfers: Sit to/from Stand Sit to Stand: Mod assist         General transfer comment: ModA for trunk elevation and to cue upright posture with RW; pt becoming fearful of falling once upright with difficulty WB through RLE  Ambulation/Gait Ambulation/Gait assistance: Max  assist Gait Distance (Feet): 1 Feet Assistive device: Rolling walker (2 wheeled) Gait Pattern/deviations: Step-to pattern;Leaning posteriorly;Trunk flexed;Decreased weight shift to right     General Gait Details: Pt fearful of falling and uncomfortable with RW, maxA to maintain upright posture and assist RLE, assist to move RW and max, multimodal cues for sequencing; pt distracted by fear of falling, difficult to redirect to task  Stairs            Wheelchair Mobility    Modified Rankin (Stroke Patients Only)       Balance Overall balance assessment: Needs assistance   Sitting balance-Leahy Scale: Fair       Standing balance-Leahy Scale: Poor Standing balance comment: Reliant on UE support and external assist                             Pertinent Vitals/Pain Pain Assessment: Faces Faces Pain Scale: Hurts little more Pain Location: RLE with mobility/WB Pain Descriptors / Indicators: Guarding;Grimacing Pain Intervention(s): Monitored during session;Repositioned    Home Living Family/patient expects to be discharged to:: Assisted living               Home Equipment: Kasandra Knudsen - single point;Electric scooter;Walker - 4 wheels;Transport chair;Grab bars - tub/shower;Grab bars - toilet;Shower seat Additional Comments: From [place]    Prior Function Level of Independence: Independent with assistive device(s)         Comments: using rollator for mobility within home and short distances; electric scooter for going to meals/longer distances; daughter-in-law drives for grocercies; pt reports indep with bathing (sits  on shower chair), toileting and dressing     Hand Dominance        Extremity/Trunk Assessment   Upper Extremity Assessment Upper Extremity Assessment: Generalized weakness    Lower Extremity Assessment Lower Extremity Assessment: Generalized weakness;RLE deficits/detail RLE Deficits / Details: s/p R femur ORIF; functionally  <3/5 hip and knee RLE: Unable to fully assess due to pain RLE Coordination: decreased gross motor;decreased fine motor    Cervical / Trunk Assessment Cervical / Trunk Assessment: Kyphotic  Communication   Communication: HOH  Cognition Arousal/Alertness: Awake/alert Behavior During Therapy: WFL for tasks assessed/performed Overall Cognitive Status: No family/caregiver present to determine baseline cognitive functioning Area of Impairment: Memory;Attention;Following commands;Safety/judgement;Awareness;Problem solving                   Current Attention Level: Selective Memory: Decreased short-term memory Following Commands: Follows multi-step commands inconsistently Safety/Judgement: Decreased awareness of deficits Awareness: Emergent Problem Solving: Slow processing;Difficulty sequencing;Requires verbal cues General Comments: Overall pt very sharp, but seems to have some confusion; exacerbated by very HOH (does not having hearing aids at hospital). Fearful of falling      General Comments      Exercises     Assessment/Plan    PT Assessment Patient needs continued PT services  PT Problem List Decreased strength;Decreased range of motion;Decreased activity tolerance;Decreased balance;Decreased mobility;Decreased cognition;Decreased knowledge of use of DME;Pain       PT Treatment Interventions DME instruction;Gait training;Functional mobility training;Therapeutic activities;Therapeutic exercise;Balance training;Patient/family education;Wheelchair mobility training    PT Goals (Current goals can be found in the Care Plan section)  Acute Rehab PT Goals Patient Stated Goal: Hopes to return to ILF instead of needing SNF PT Goal Formulation: With patient Time For Goal Achievement: 08/26/19 Potential to Achieve Goals: Fair    Frequency Min 5X/week   Barriers to discharge        Co-evaluation               AM-PAC PT "6 Clicks" Mobility  Outcome Measure Help  needed turning from your back to your side while in a flat bed without using bedrails?: A Lot Help needed moving from lying on your back to sitting on the side of a flat bed without using bedrails?: A Lot Help needed moving to and from a bed to a chair (including a wheelchair)?: A Lot Help needed standing up from a chair using your arms (e.g., wheelchair or bedside chair)?: A Lot Help needed to walk in hospital room?: A Lot Help needed climbing 3-5 steps with a railing? : Total 6 Click Score: 11    End of Session Equipment Utilized During Treatment: Gait belt Activity Tolerance: Patient tolerated treatment well;Patient limited by pain Patient left: in chair;with call bell/phone within reach;with chair alarm set Nurse Communication: Mobility status PT Visit Diagnosis: Other abnormalities of gait and mobility (R26.89);Pain;Muscle weakness (generalized) (M62.81) Pain - Right/Left: Left Pain - part of body: Leg    Time: [id number] PT Time Calculation (min) (ACUTE ONLY): 27 min   Charges:   PT Evaluation $PT Eval Moderate Complexity: 1 Mod PT Treatments $Therapeutic Activity: 8-22 mins   Mabeline Caras, PT, DPT Acute Rehabilitation Services  Pager [phone] Office [place] 08/12/2019, 12:08 PM

## 2019-08-12 NOTE — Progress Notes (Addendum)
Progress Note   Vanessa Knox [id number] DOB: [birth year] DOA: 08/10/2019  PCP: Leanna Battles, MD Consultants:  Percell Miller - orthopedics Patient coming from: [place]; NOK: Melida Gimenez [place], [phone]  Chief Complaint: fall  HPI: Vanessa Knox is a 85 y.o. female with medical history significant of HTN and spinal stenosis presenting with a mechanical fall.  She also fell in Dec, resulting in a R intertrochanteric hip fracture.  She was discharged to [facility] for rehab and returned back home again.  Today, she got up to use the bathroom and slipped, causing her walker to pull her down.  She had a head laceration as well as R knee injury.  No LOC.  Felt well prior to the fall.  In ED patient was found to have supracondylar femur fracture. Ortho (Dr. Percell Miller) planning for OR this morning.   Subjective: No acute issues or events overnight, pain well controlled, currently eating breakfast excited for physical therapy later this morning. Denies chest pain, shortness of breath, nausea, vomiting, diarrhea, constipation, headache, fevers, chills.   Assessment/Plan Principal Problem:   Supracondylar fracture of femur, right, open type I or II, initial encounter [facility]) Active Problems:   HTN (hypertension)   Supracondylar open R femur fracture s/p fall -Mechanical fall s/p open reduction internal fixation of right distal femur fracture; Debridement and closure of right knee skin tear on 08/11/19 -Patient tolerated procedure quite well, excited for therapy later today -Lovenox 30 daily per orthopedics -Pain control with Robxain, Vicodin, and Morphine prn -SW consult for rehab placement -Will need PT evaluation postop, likely disposition to SNF  Acute on chronic anemia: likely blood loss anemia in the setting of chronic anemia of chronic disease  -Hemoglobin minimally downtrending overnight, likely postprocedural -Baseline hemoglobin appears to be around 8 per chart  review  HTN, essential, well-controlled -Continue Toprol XL   DVT prophylaxis: Lovenox Code Status:  DNR - confirmed with patient Family Communication: Son at bedside, all questions answered Disposition Plan: Pending clinical status and postsurgical ambulatory status -likely SNF given advanced age and surgery Consults: Orthopedics Admission status: Admit - It is my clinical opinion that admission to INPATIENT is reasonable and necessary because of the expectation that this patient will require hospital care that crosses at least 2 midnights to treat this condition based on the medical complexity of the problems presented.  Given the aforementioned information, the predictability of an adverse outcome is felt to be significant.  Ambulatory Status:  Ambulates with a walker prior to admission  Physical Exam: Vitals:   08/12/19 0028 08/12/19 0434 08/12/19 0830 08/12/19 0917  BP: (!) 141/70 (!) 147/70 (!) 149/56 (!) 149/56  Pulse: 84 85 83 83  Resp: 16 15 16    Temp: 98 F (36.7 C) 98 F (36.7 C) 98.2 F (36.8 C)   TempSrc: Oral Oral Oral   SpO2: 96% 97% 98%   Weight:      Height:        General:  Pleasantly resting in bed, No acute distress. HEENT: Bandage clean dry intact. Normocephalic atraumatic.  Sclerae nonicteric, noninjected.  Extraocular movements intact bilaterally. Neck:  Without mass or deformity.  Trachea is midline. Lungs:  Clear to auscultate bilaterally without rhonchi, wheeze, or rales. Heart:  Regular rate and rhythm.  Without murmurs, rubs, or gallops. Abdomen:  Soft, nontender, nondistended.  Without guarding or rebound. Extremities: Multiple bandages clean dry intact.  Without cyanosis, clubbing, edema, or obvious deformity. Vascular:  Dorsalis pedis and posterior tibial pulses palpable  bilaterally.  Radiological Exams on Admission: Dg C-arm 1-60 Min  Result Date: 08/11/2019 CLINICAL DATA:  ORIF right distal femur EXAM: RIGHT FEMUR 2 VIEWS; DG C-ARM 1-60 MIN  COMPARISON:  Radiographs August 10, 2019 FINDINGS: Intraoperative fluoroscopy redemonstrates the right femoral intramedullary rod and transcervical cancellous screw which transfixes the remote proximal femoral fractures. There is a fracture of 1 of the distal fixation screws of this intramedullary rod. Interval addition of a lateral sideplate and screw fixation construct which transfixes the distal femoral periprosthetic fracture seen on radiographs from 1 day prior. Hardware is intact and engaged. Alignment is near anatomic. Expected postsurgical soft tissue changes are noted. No unexpected radiopaque foreign body. 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 IMPRESSION: Expected appearance post ORIF the distal right femur. Electronically Signed   By: Lovena Le M.D.   On: 08/11/2019 13:52   Dg Femur, Min 2 Views Right  Result Date: 08/11/2019 CLINICAL DATA:  ORIF right distal femur EXAM: RIGHT FEMUR 2 VIEWS; DG C-ARM 1-60 MIN COMPARISON:  Radiographs August 10, 2019 FINDINGS: Intraoperative fluoroscopy redemonstrates the right femoral intramedullary rod and transcervical cancellous screw which transfixes the remote proximal femoral fractures. There is a fracture of 1 of the distal fixation screws of this intramedullary rod. Interval addition of a lateral sideplate and screw fixation construct which transfixes the distal femoral periprosthetic fracture seen on radiographs from 1 day prior. Hardware is intact and engaged. Alignment is near anatomic. Expected postsurgical soft tissue changes are noted. No unexpected radiopaque foreign body. 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 IMPRESSION: Expected appearance post ORIF the distal right femur. Electronically Signed   By: Lovena Le M.D.   On: 08/11/2019 13:52   Dg Femur Port, Min 2 Views Right  Result Date: 08/11/2019 CLINICAL DATA:  85 year old female status post ORIF of right  femoral fracture EXAM: RIGHT FEMUR PORTABLE 2 VIEW COMPARISON:  Preoperative radiographs dated 08/10/2019 FINDINGS: Interval ORIF of periprosthetic distal femoral fracture by application of a lateral buttress plate and screw construct. Of note, there is good apposition of the plate proximally and distally. However in the region of the prior fracture the plate is lateral to the femoral cortex by approximately 5 mm. The more cephalad of the 2 distal interlocking screws from the prior intramedullary nail is fractured. Surgical changes of prior intramedullary nail, transfemoral neck lag screw and total knee arthroplasty are evident. IMPRESSION: 1. Interval ORIF of periprosthetic distal femoral fracture with application of a lateral buttress plate and screw construct. Of note, while there is good apposition of the plate to the femoral cortex at the proximal and distal aspects of the plate, the segment in the mid and mid to distal plate does not contact the femoral cortex and is displaced laterally by approximately 5 mm. 2. Surgical changes of prior ORIF of a intertrochanteric femoral fracture. 3. Surgical changes of prior total knee arthroplasty. Electronically Signed   By: Jacqulynn Cadet M.D.   On: 08/11/2019 14:57    Little Ishikawa DO [facility]  08/12/2019, 12:52 PM

## 2019-08-12 NOTE — Progress Notes (Signed)
Orthopaedic Trauma Progress Note  S: Doing very well this AM, minimal pain and requiring minimal/no pain medications. Has not been up with therapy yet. Denies numbness or tingling.  Wants to know if she will be able to go back to her independent living facility at discharge. Does note that she has a motorized scooter/wheelchair and a walker at home.   O:  Vitals:   08/12/19 0028 08/12/19 0434  BP: (!) 141/70 (!) 147/70  Pulse: 84 85  Resp: 16 15  Temp: 98 F (36.7 C) 98 F (36.7 C)  SpO2: 96% 97%    General - Sitting up in bed, NAD  Right Lower Extremity - Dressing in place is clean, dry, intact. Mildly tender with palpation of knee and distal femur, non-tender in lower leg. Sensation intact to light touch distally. Able to wiggle toes. 2+ DP pulse  Imaging: Stable post op imaging.   Labs:  Results for orders placed or performed during the hospital encounter of 08/10/19 (from the past 24 hour(s))  VITAMIN D 25 Hydroxy (Vit-D Deficiency, Fractures)     Status: None   Collection Time: 08/11/19  4:37 PM  Result Value Ref Range   Vit D, 25-Hydroxy 31.7 30.0 - 100.0 ng/mL  CBC     Status: Abnormal   Collection Time: 08/12/19  4:29 AM  Result Value Ref Range   WBC 9.3 4.0 - 10.5 K/uL   RBC 2.68 (L) 3.87 - 5.11 MIL/uL   Hemoglobin 7.7 (L) 12.0 - 15.0 g/dL   HCT 24.8 (L) 36.0 - 46.0 %   MCV 92.5 80.0 - 100.0 fL   MCH 28.7 26.0 - 34.0 pg   MCHC 31.0 30.0 - 36.0 g/dL   RDW 13.4 11.5 - 15.5 %   Platelets 198 150 - 400 K/uL   nRBC 0.0 0.0 - 0.2 %  Basic metabolic panel     Status: Abnormal   Collection Time: 08/12/19  4:29 AM  Result Value Ref Range   Sodium 138 135 - 145 mmol/L   Potassium 4.3 3.5 - 5.1 mmol/L   Chloride 102 98 - 111 mmol/L   CO2 28 22 - 32 mmol/L   Glucose, Bld 113 (H) 70 - 99 mg/dL   BUN 21 8 - 23 mg/dL   Creatinine, Ser 0.97 0.44 - 1.00 mg/dL   Calcium 8.0 (L) 8.9 - 10.3 mg/dL   GFR calc non Af Amer 48 (L) >60 mL/min   GFR calc Af Amer 55 (L) >60 mL/min    Anion gap 8 5 - 15    Assessment: 85 year old female s/p fall  Injuries: Right supracondylar distal femur fracture s/p ORIF  Weightbearing: WBAT RLE  Insicional and dressing care: Will change dressing tomorrow.   Orthopedic device(s): None  CV/Blood loss: Acute blood loss anemia, Hgb 7.7 this AM. Monitor CBC. Hemodynamically stable  Pain management:  1. Tylenol 650 mg q 6 hours PRN  2. Robaxin 500 mg q 6 hours PRN 3. Norco 5/325 mg q 6 hours PRN 4. Neurontin 100 mg TID 5. Morphine 0.5 mg q 2 hours PRN  VTE prophylaxis: Lovenox 30 mg starting today  ID: Ancef 2gm post op completed  Foley/Lines: No foley, KVO IVFs  Medical co-morbidities: HTN, history of TIA, spinal stenosis  Dispo: PT/OT eval today. May require SNF for acute rehab before returning to independent living facility. Although not deficient, have started patient on Vitamin D3, would recommend she continue this at discharge during bone healing  Follow -  up plan: 2 weeks for suture removal and repeat x-rays    Vanessa Knox [facility] ?[phone]? (phone)

## 2019-08-13 LAB — BASIC METABOLIC PANEL
Anion gap: 9 (ref 5–15)
BUN: 28 mg/dL — ABNORMAL HIGH (ref 8–23)
CO2: 26 mmol/L (ref 22–32)
Calcium: 7.9 mg/dL — ABNORMAL LOW (ref 8.9–10.3)
Chloride: 105 mmol/L (ref 98–111)
Creatinine, Ser: 0.98 mg/dL (ref 0.44–1.00)
GFR calc Af Amer: 54 mL/min — ABNORMAL LOW (ref >60)
GFR calc non Af Amer: 47 mL/min — ABNORMAL LOW (ref >60)
Glucose, Bld: 104 mg/dL — ABNORMAL HIGH (ref 70–99)
Potassium: 4.2 mmol/L (ref 3.5–5.1)
Sodium: 140 mmol/L (ref 135–145)

## 2019-08-13 LAB — CBC
HCT: 22.8 % — ABNORMAL LOW (ref 36.0–46.0)
Hemoglobin: 7.1 g/dL — ABNORMAL LOW (ref 12.0–15.0)
MCH: 29.6 pg (ref 26.0–34.0)
MCHC: 31.1 g/dL (ref 30.0–36.0)
MCV: 95 fL (ref 80.0–100.0)
Platelets: 193 10*3/uL (ref 150–400)
RBC: 2.4 MIL/uL — ABNORMAL LOW (ref 3.87–5.11)
RDW: 13.5 % (ref 11.5–15.5)
WBC: 7.2 10*3/uL (ref 4.0–10.5)
nRBC: 0 % (ref 0.0–0.2)

## 2019-08-13 MED ORDER — HYDROCODONE-ACETAMINOPHEN 5-325 MG PO TABS: 1.0000 | ORAL_TABLET | Freq: Four times a day (QID) | ORAL | 0 refills | Status: AC | PRN

## 2019-08-13 MED ORDER — VITAMIN D3 25 MCG PO TABS: 2000.0000 [IU] | ORAL_TABLET | Freq: Every day | ORAL | 1 refills | Status: AC

## 2019-08-13 NOTE — Progress Notes (Signed)
Physical Therapy Treatment Patient Details Name: Vanessa Knox MRN: [id number] DOB: 1916/05/30 Today's Date: 08/13/2019    History of Present Illness Pt is a 85 y.o. female admitted from independent living facility on 08/10/19 after fall; pt sustained forehead lacerations and R distal femur fx s/p ORIF 8/26. PMH includes HTN, sciatic, R rotator cuff repair; previous R femur IM nail (11/2018).    PT Comments    Pt supine in bed on arrival and eager to mobilize OOB.  She reports feeling stiff from lying in bed and reports pain.  Informed nursing of request for pain meds.  Pt performed B LE exercises in supine with AAROM due to pain.  Pt performed transfer to edge of bed and to standing with RW and sara stedy,  She required mod to max assistance +1 to mobilize with flexed posture.  Pt continues to lack strength and remains limited due to pain.  Pt continues to benefit from SNF placement but her and her son are leaning toward home with HHPT.  Will continue PT in acute setting to prepare for return home.     Follow Up Recommendations  SNF;Supervision/Assistance - 24 hour     Equipment Recommendations  Rolling walker with 5" wheels    Recommendations for Other Services       Precautions / Restrictions Precautions Precautions: Fall Restrictions Weight Bearing Restrictions: Yes RLE Weight Bearing: Weight bearing as tolerated(per orthopedic progress note)    Mobility  Bed Mobility Overal bed mobility: Needs Assistance Bed Mobility: Supine to Sit     Supine to sit: Max assist     General bed mobility comments: MaxA to scoot hips to EOB and elevate trunk, pt initiating movement well with cues and use of bed rail  Transfers Overall transfer level: Needs assistance Equipment used: Rolling walker (2 wheeled);Ambulation equipment used(RW used x 2 trials with max assistance, utilized stedy for all other transfers due to pain.) Transfers: Sit to/from Stand Sit to Stand: Mod assist;Max  assist         General transfer comment: Pt with decreased weight bearing and flexed posture in standing.  She will require a youth RW next visit.  utilized sara stedy for all other transfers as she was extremely painful this session.  Ambulation/Gait Ambulation/Gait assistance: (NT too painful during weight bearing to progress gt.)               Stairs             Wheelchair Mobility    Modified Rankin (Stroke Patients Only)       Balance Overall balance assessment: Needs assistance   Sitting balance-Leahy Scale: Fair       Standing balance-Leahy Scale: Poor Standing balance comment: Reliant on UE support and external assist                            Cognition Arousal/Alertness: Awake/alert Behavior During Therapy: WFL for tasks assessed/performed Overall Cognitive Status: Difficult to assess                           Safety/Judgement: Decreased awareness of deficits     General Comments: Pt seems appropriate during session but frustrated that she has not been OOB yet.      Exercises General Exercises - Lower Extremity Ankle Circles/Pumps: AROM;Both;20 reps;Supine Quad Sets: AROM;Both;10 reps;Supine Heel Slides: AAROM;Both;10 reps;Supine Hip ABduction/ADduction: AAROM;AROM;Both;10 reps;Supine Straight Leg Raises: AAROM;Both;10 reps;Supine  General Comments        Pertinent Vitals/Pain Pain Assessment: 0-10 Pain Score: 7  Pain Location: RLE with mobility/WB Pain Descriptors / Indicators: Guarding;Grimacing Pain Intervention(s): Monitored during session;Repositioned    Home Living                      Prior Function            PT Goals (current goals can now be found in the care plan section) Acute Rehab PT Goals Patient Stated Goal: Hopes to return to ILF instead of needing SNF PT Goal Formulation: With patient Potential to Achieve Goals: Fair Progress towards PT goals: Progressing toward  goals(slowly)    Frequency    Min 5X/week      PT Plan Current plan remains appropriate    Co-evaluation              AM-PAC PT "6 Clicks" Mobility   Outcome Measure  Help needed turning from your back to your side while in a flat bed without using bedrails?: A Lot Help needed moving from lying on your back to sitting on the side of a flat bed without using bedrails?: A Lot Help needed moving to and from a bed to a chair (including a wheelchair)?: Total Help needed standing up from a chair using your arms (e.g., wheelchair or bedside chair)?: Total Help needed to walk in hospital room?: Total Help needed climbing 3-5 steps with a railing? : Total 6 Click Score: 8    End of Session Equipment Utilized During Treatment: Gait belt Activity Tolerance: Patient tolerated treatment well;Patient limited by pain Patient left: in chair;with call bell/phone within reach;with chair alarm set Nurse Communication: Mobility status PT Visit Diagnosis: Other abnormalities of gait and mobility (R26.89);Pain;Muscle weakness (generalized) (M62.81) Pain - Right/Left: Left Pain - part of body: Leg     Time: [id number] PT Time Calculation (min) (ACUTE ONLY): 53 min  Charges:  $Therapeutic Exercise: 8-22 mins $Therapeutic Activity: 38-52 mins                     Governor Rooks, PTA Acute Rehabilitation Services Pager [phone] Office [phone]     Vanessa Knox 08/13/2019, 3:59 PM

## 2019-08-13 NOTE — TOC Initial Note (Addendum)
Transition of Care [facility]) - Initial/Assessment Note    Patient Details  Name: Vanessa Knox MRN: [id number] Date of Birth: Oct 17, 1916  Transition of Care [facility] CM/SW Contact:    Gelene Mink, [place] Phone Number: 08/13/2019, 3:45 PM  Clinical Narrative:                 Update: CSW received a return phone call from [place] with [place] [facility]. She stated that they would be able to provide outpatient therapy in the patient's apartment. She stated that if they patient had home health needs then they would not be able to assist.   CSW is going to find out if the patient has any home health needs.     _________________________________   CSW met with the patient at bedside. CSW introduced herself and explained her role. CSW shared the therapy recommendation. The patient stated that she would not like to go to rehab, she would like to return to [place] with home health therapy. [facility]. CSW asked if she could not return back to her apartment would she be agreeable to SNF. She stated that she would need to speak with her son. She stated that he was on his way to [place]. CSW asked permission to contact him on the patient's behalf.   CSW contacted the patient's son, [name]. CSW introduced herself and shared the therapy recommendation. He stated that he would prefer his mother to return back to [place] with home health if she was able too. CSW asked if she was not able too, would they want SNF. He stated that they would consider it but he wanted to know if she could return back to her ILF. The patient has been to [facility] in the past and he does not want her to return if SNF ends up being the option.   CSW contacted [place]. They provided the number for [facility]. CSW called Burt Ek [phone]) with [facility] and left a voicemail. CSW is awaiting a return phone call to see if they are able to assist the patient with home health.    CSW will continue to follow and assist with disposition planning.   Expected Discharge Plan: [place] Barriers to Discharge: Insurance Authorization, Continued Medical Work up   Patient Goals and CMS Choice Patient states their goals for this hospitalization and ongoing recovery are:: Pt would like to return back to [place] with home health CMS Medicare.gov Compare Post Acute Care list provided to:: Patient Choice offered to / list presented to : Patient  Expected Discharge Plan and Services Expected Discharge Plan: [place] In-house Referral: Clinical Social Work   Post Acute Care Choice: [place] arrangements for the past 2 months: [place]                                      Prior Living Arrangements/Services Living arrangements for the past 2 months: [place] Lives with:: Facility Resident Patient language and need for interpreter reviewed:: No Do you feel safe going back to the place where you live?: Yes      Need for Family Participation in Patient Care: Yes (Comment) Care giver support system in place?: Yes (comment) Current home services: DME Criminal Activity/Legal Involvement Pertinent to Current Situation/Hospitalization: No - Comment as needed  Activities of Daily  Living Home Assistive Devices/Equipment: Gilford Rile (specify type) ADL Screening (condition at time of admission) Patient's cognitive ability adequate to safely complete daily activities?: Yes Is the patient deaf or have difficulty hearing?: Yes Does the patient have difficulty seeing, even when wearing glasses/contacts?: Yes Does the patient have difficulty concentrating, remembering, or making decisions?: Yes Patient able to express need for assistance with ADLs?: Yes Does the patient have difficulty dressing or bathing?: Yes Independently performs ADLs?: No Communication: Needs assistance Does the  patient have difficulty walking or climbing stairs?: Yes Weakness of Legs: Both Weakness of Arms/Hands: Both  Permission Sought/Granted Permission sought to share information with : Case Manager Permission granted to share information with : Yes, Verbal Permission Granted  Share Information with NAME: [name]     Permission granted to share info w Relationship: son  Permission granted to share info w Contact Information: [phone]  Emotional Assessment Appearance:: Appears stated age Attitude/Demeanor/Rapport: Engaged Affect (typically observed): Calm Orientation: : Oriented to Self, Oriented to Place, Oriented to  Time, Oriented to Situation Alcohol / Substance Use: Not Applicable Psych Involvement: No (comment)  Admission diagnosis:  Fall, initial encounter [W19.XXXA] Laceration of scalp, initial encounter [S01.01XA] Type I or II open nondisplaced supracondylar fracture of right femur without intracondylar extension, initial encounter [facility]) [S72.454B] Patient Active Problem List   Diagnosis Date Noted  . Supracondylar fracture of femur, right, open type I or II, initial encounter ([place]) 08/10/2019  . Multiple closed pelvic fractures without disruption of pelvic circle ([place]) 11/20/2018  . Comminuted fracture of hip, right, closed, initial encounter ([place]) 11/20/2018  . TIA (transient ischemic attack) 07/14/2017  . HTN (hypertension) 07/14/2017  . Compression fracture of L5 lumbar vertebra, sequela 01/02/2017  . Spinal stenosis of lumbar region with neurogenic claudication 01/02/2017  . Chronic low back pain with left-sided sciatica 01/02/2017   PCP:  Leanna Battles, MD Pharmacy:   [id number] [id number]: [phone]: [phone]    Social Determinants of Health (SDOH) Interventions    Readmission Risk Interventions No flowsheet data found.

## 2019-08-13 NOTE — Plan of Care (Signed)

## 2019-08-13 NOTE — Progress Notes (Signed)
Orthopaedic Trauma Progress Note  S: Doing well this AM, minimal pain and requiring minimal/no pain medications. Denies numbness or tingling.  Was able to get up to bedside chair with therapy yesterday. Therapies recommending SNF. She is hopeful that she will progress with mobility while in the hospital and not need SNF and instead be able to return to independent living facility at discharge. Is slightly frustrated with service she has received so far in regards to getting meals. Will get this cleared up today with nursing and nutrition services  O:  Vitals:   08/12/19 2159 08/13/19 0557  BP: (!) 136/51 (!) 155/64  Pulse: 83 82  Resp:  16  Temp:  98.2 F (36.8 C)  SpO2:  94%    General - Sitting up in bed, NAD  Right Lower Extremity - Dressing removed, incisions clean, dry, intact. Swelling in knee as expected.  Mildly tender with palpation of knee. mild calf tenderness. Dorsiflexion/plantarflexion intact. Sensation intact to light touch distally. Able to wiggle toes. 2+ DP pulse  Imaging: Stable post op imaging.   Labs:  Results for orders placed or performed during the hospital encounter of 08/10/19 (from the past 24 hour(s))  CBC     Status: Abnormal   Collection Time: 08/13/19  4:57 AM  Result Value Ref Range   WBC 7.2 4.0 - 10.5 K/uL   RBC 2.40 (L) 3.87 - 5.11 MIL/uL   Hemoglobin 7.1 (L) 12.0 - 15.0 g/dL   HCT 22.8 (L) 36.0 - 46.0 %   MCV 95.0 80.0 - 100.0 fL   MCH 29.6 26.0 - 34.0 pg   MCHC 31.1 30.0 - 36.0 g/dL   RDW 13.5 11.5 - 15.5 %   Platelets 193 150 - 400 K/uL   nRBC 0.0 0.0 - 0.2 %  Basic metabolic panel     Status: Abnormal   Collection Time: 08/13/19  4:57 AM  Result Value Ref Range   Sodium 140 135 - 145 mmol/L   Potassium 4.2 3.5 - 5.1 mmol/L   Chloride 105 98 - 111 mmol/L   CO2 26 22 - 32 mmol/L   Glucose, Bld 104 (H) 70 - 99 mg/dL   BUN 28 (H) 8 - 23 mg/dL   Creatinine, Ser 0.98 0.44 - 1.00 mg/dL   Calcium 7.9 (L) 8.9 - 10.3 mg/dL   GFR calc non Af  Amer 47 (L) >60 mL/min   GFR calc Af Amer 54 (L) >60 mL/min   Anion gap 9 5 - 15    Assessment: 85 year old female s/p fall  Injuries: Right supracondylar distal femur fracture s/p ORIF  Weightbearing: WBAT RLE  Insicional and dressing care: Dressing clean, dry, intact. Continue to change as needed  Orthopedic device(s): None  CV/Blood loss: Acute blood loss anemia, Hgb 7.1 this AM. Monitor CBC closely. Hemodynamically stable  Pain management:  1. Tylenol 650 mg q 6 hours PRN  2. Robaxin 500 mg q 6 hours PRN 3. Norco 5/325 mg q 6 hours PRN 4. Neurontin 100 mg TID 5. Morphine 0.5 mg q 2 hours PRN  VTE prophylaxis: Lovenox 30 mg   ID: Ancef 2gm post op completed  Foley/Lines: No foley, KVO IVFs  Medical co-morbidities: HTN, history of TIA, spinal stenosis  Dispo: PT/OT eval, recommending SNF. Continue to monitor CBC closely. Although not deficient, have started patient on Vitamin D3, would recommend she continue this at discharge during bone healing. Okay for discharge from ortho standpoint once mobilizing well with therapy and  Hgb has stabilized.  D/C prescriptions for pain medication and DVT prophylaxis have been signed and placed in chart  Follow - up plan: 2 weeks for suture removal and repeat x-rays    Jolana Runkles A. [facility] ?[phone]? (phone)

## 2019-08-13 NOTE — Plan of Care (Signed)
°  Problem: Coping: °Goal: Level of anxiety will decrease °Outcome: Progressing °  °

## 2019-08-13 NOTE — Progress Notes (Signed)
Progress Note   Vanessa Knox [id number] DOB: Nov 10, 1916 DOA: 08/10/2019  PCP: Leanna Battles, MD Consultants:  Percell Miller - orthopedics Patient coming from: [place]; NOK: Melida Gimenez [place], [phone]  Chief Complaint: fall  HPI: Vanessa Knox is a 85 y.o. female with medical history significant of HTN and spinal stenosis presenting with a mechanical fall.  She also fell in Dec, resulting in a R intertrochanteric hip fracture.  She was discharged to [facility] for rehab and returned back home again.  Today, she got up to use the bathroom and slipped, causing her walker to pull her down.  She had a head laceration as well as R knee injury.  No LOC.  Felt well prior to the fall.  In ED patient was found to have supracondylar femur fracture. Ortho (Dr. Percell Miller) planning for OR this morning.   Subjective: No acute issues or events overnight, pain well controlled. Denies chest pain, shortness of breath, nausea, vomiting, diarrhea, constipation, headache, fevers, chills.  Assessment/Plan Principal Problem:   Supracondylar fracture of femur, right, open type I or II, initial encounter [facility]) Active Problems:   HTN (hypertension)   Supracondylar open R femur fracture s/p fall -Mechanical fall s/p open reduction internal fixation of right distal femur fracture; Debridement and closure of right knee skin tear on 08/11/19 -Patient tolerated procedure quite well, excited for therapy later today -Lovenox 30 daily per orthopedics -Pain control with Robxain, Vicodin, and Morphine prn (will attempt to wean of IV narcotics given pain well controlled currently) -SW consult for rehab placement -Will need PT evaluation postop, likely disposition to SNF  Acute on chronic anemia: likely blood loss anemia in the setting of chronic anemia of chronic disease  -Hemoglobin minimally downtrending - currently 7.1 - follow with repeat labs -Consider transfusion if Hgb <7.0 or patient  symptomatic with PT later today - appears well and no complaints currently. -Baseline hemoglobin appears to be around 8 per chart review  HTN, essential, well-controlled -Continue Toprol XL  DVT prophylaxis: Lovenox Code Status:  DNR - confirmed with patient Family Communication: Son at bedside, all questions answered Disposition Plan: Pending clinical status and postsurgical ambulatory status -likely SNF given advanced age and surgery Consults: Orthopedics Admission status: Inpatient - continues to require postoperative care, pain control, physical therapy, pending disposition in the next 24 to 48 hours pending clinical course.  Ambulatory Status:  Ambulates with a walker prior to admission  Physical Exam: Vitals:   08/12/19 1733 08/12/19 2106 08/12/19 2159 08/13/19 0557  BP: 137/68 (!) 138/108 (!) 136/51 (!) 155/64  Pulse: 82 97 83 82  Resp: 16 13  16   Temp: 98 F (36.7 C) 98.2 F (36.8 C)  98.2 F (36.8 C)  TempSrc: Oral Oral  Oral  SpO2: 97% 97%  94%  Weight:      Height:        General:  Pleasantly resting in bed, No acute distress. HEENT: Bandage clean dry intact. Normocephalic atraumatic.  Sclerae nonicteric, noninjected.  Extraocular movements intact bilaterally. Neck:  Without mass or deformity.  Trachea is midline. Lungs:  Clear to auscultate bilaterally without rhonchi, wheeze, or rales. Heart:  Regular rate and rhythm.  Without murmurs, rubs, or gallops. Abdomen:  Soft, nontender, nondistended.  Without guarding or rebound. Extremities: Multiple bandages clean dry intact.  Without cyanosis, clubbing, edema, or obvious deformity. Vascular:  Dorsalis pedis and posterior tibial pulses palpable bilaterally.  Radiological Exams on Admission: Dg C-arm 1-60 Min  Result Date: 08/11/2019 CLINICAL  DATA:  ORIF right distal femur EXAM: RIGHT FEMUR 2 VIEWS; DG C-ARM 1-60 MIN COMPARISON:  Radiographs August 10, 2019 FINDINGS: Intraoperative fluoroscopy redemonstrates the  right femoral intramedullary rod and transcervical cancellous screw which transfixes the remote proximal femoral fractures. There is a fracture of 1 of the distal fixation screws of this intramedullary rod. Interval addition of a lateral sideplate and screw fixation construct which transfixes the distal femoral periprosthetic fracture seen on radiographs from 1 day prior. Hardware is intact and engaged. Alignment is near anatomic. Expected postsurgical soft tissue changes are noted. No unexpected radiopaque foreign body. 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 IMPRESSION: Expected appearance post ORIF the distal right femur. Electronically Signed   By: Lovena Le M.D.   On: 08/11/2019 13:52   Dg Femur, Min 2 Views Right  Result Date: 08/11/2019 CLINICAL DATA:  ORIF right distal femur EXAM: RIGHT FEMUR 2 VIEWS; DG C-ARM 1-60 MIN COMPARISON:  Radiographs August 10, 2019 FINDINGS: Intraoperative fluoroscopy redemonstrates the right femoral intramedullary rod and transcervical cancellous screw which transfixes the remote proximal femoral fractures. There is a fracture of 1 of the distal fixation screws of this intramedullary rod. Interval addition of a lateral sideplate and screw fixation construct which transfixes the distal femoral periprosthetic fracture seen on radiographs from 1 day prior. Hardware is intact and engaged. Alignment is near anatomic. Expected postsurgical soft tissue changes are noted. No unexpected radiopaque foreign body. 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 IMPRESSION: Expected appearance post ORIF the distal right femur. Electronically Signed   By: Lovena Le M.D.   On: 08/11/2019 13:52   Dg Femur Port, Min 2 Views Right  Result Date: 08/11/2019 CLINICAL DATA:  85 year old female status post ORIF of right femoral fracture EXAM: RIGHT FEMUR PORTABLE 2 VIEW COMPARISON:  Preoperative radiographs dated  08/10/2019 FINDINGS: Interval ORIF of periprosthetic distal femoral fracture by application of a lateral buttress plate and screw construct. Of note, there is good apposition of the plate proximally and distally. However in the region of the prior fracture the plate is lateral to the femoral cortex by approximately 5 mm. The more cephalad of the 2 distal interlocking screws from the prior intramedullary nail is fractured. Surgical changes of prior intramedullary nail, transfemoral neck lag screw and total knee arthroplasty are evident. IMPRESSION: 1. Interval ORIF of periprosthetic distal femoral fracture with application of a lateral buttress plate and screw construct. Of note, while there is good apposition of the plate to the femoral cortex at the proximal and distal aspects of the plate, the segment in the mid and mid to distal plate does not contact the femoral cortex and is displaced laterally by approximately 5 mm. 2. Surgical changes of prior ORIF of a intertrochanteric femoral fracture. 3. Surgical changes of prior total knee arthroplasty. Electronically Signed   By: Jacqulynn Cadet M.D.   On: 08/11/2019 14:57   Time spent: 30 minutes  Little Ishikawa DO [facility]  08/13/2019, 8:03 AM

## 2019-08-13 NOTE — Discharge Instructions (Signed)
Orthopaedic Trauma Service Discharge Instructions   General Discharge Instructions  WEIGHT BEARING STATUS: Weightbearing as tolerated right leg  RANGE OF MOTION/ACTIVITY: Okay for gentle knee range of motion  Wound Care: Incisions can be left open to air if there is no drainage. If incision continues to have drainage, follow wound care instructions below. Okay to shower if no drainage from incisions.  DVT/PE prophylaxis: Aspirin  Diet: as you were eating previously.  Can use over the counter stool softeners and bowel preparations, such as Miralax, to help with bowel movements.  Narcotics can be constipating.  Be sure to drink plenty of fluids  PAIN MEDICATION USE AND EXPECTATIONS  You have likely been given narcotic medications to help control your pain.  After a traumatic event that results in an fracture (broken bone) with or without surgery, it is ok to use narcotic pain medications to help control one's pain.  We understand that everyone responds to pain differently and each individual patient will be evaluated on a regular basis for the continued need for narcotic medications. Ideally, narcotic medication use should last no more than 6-8 weeks (coinciding with fracture healing).   As a patient it is your responsibility as well to monitor narcotic medication use and report the amount and frequency you use these medications when you come to your office visit.   We would also advise that if you are using narcotic medications, you should take a dose prior to therapy to maximize you participation.  IF YOU ARE ON NARCOTIC MEDICATIONS IT IS NOT PERMISSIBLE TO OPERATE A MOTOR VEHICLE (MOTORCYCLE/CAR/TRUCK/MOPED) OR HEAVY MACHINERY DO NOT MIX NARCOTICS WITH OTHER CNS (CENTRAL NERVOUS SYSTEM) DEPRESSANTS SUCH AS ALCOHOL   STOP SMOKING OR USING NICOTINE PRODUCTS!!!!  As discussed nicotine severely impairs your body's ability to heal surgical and traumatic wounds but also impairs bone healing.   Wounds and bone heal by forming microscopic blood vessels (angiogenesis) and nicotine is a vasoconstrictor (essentially, shrinks blood vessels).  Therefore, if vasoconstriction occurs to these microscopic blood vessels they essentially disappear and are unable to deliver necessary nutrients to the healing tissue.  This is one modifiable factor that you can do to dramatically increase your chances of healing your injury.    (This means no smoking, no nicotine gum, patches, etc)  DO NOT USE NONSTEROIDAL ANTI-INFLAMMATORY DRUGS (NSAID'S)  Using products such as Advil (ibuprofen), Aleve (naproxen), Motrin (ibuprofen) for additional pain control during fracture healing can delay and/or prevent the healing response.  If you would like to take over the counter (OTC) medication, Tylenol (acetaminophen) is ok.  However, some narcotic medications that are given for pain control contain acetaminophen as well. Therefore, you should not exceed more than 4000 mg of tylenol in a day if you do not have liver disease.  Also note that there are may OTC medicines, such as cold medicines and allergy medicines that my contain tylenol as well.  If you have any questions about medications and/or interactions please ask your doctor/PA or your pharmacist.      ICE AND ELEVATE INJURED/OPERATIVE EXTREMITY  Using ice and elevating the injured extremity above your heart can help with swelling and pain control.  Icing in a pulsatile fashion, such as 20 minutes on and 20 minutes off, can be followed.    Do not place ice directly on skin. Make sure there is a barrier between to skin and the ice pack.    Using frozen items such as frozen peas works well as  the conform nicely to the are that needs to be iced.  USE AN ACE WRAP OR TED HOSE FOR SWELLING CONTROL  In addition to icing and elevation, Ace wraps or TED hose are used to help limit and resolve swelling.  It is recommended to use Ace wraps or TED hose until you are informed to  stop.    When using Ace Wraps start the wrapping distally (farthest away from the body) and wrap proximally (closer to the body)   Example: If you had surgery on your leg or thing and you do not have a splint on, start the ace wrap at the toes and work your way up to the thigh        If you had surgery on your upper extremity and do not have a splint on, start the ace wrap at your fingers and work your way up to the upper arm    [place]: [phone]   VISIT OUR WEBSITE FOR ADDITIONAL INFORMATION: [email or web address]     Discharge Wound Care Instructions  Do NOT apply any ointments, solutions or lotions to pin sites or surgical wounds.  These prevent needed drainage and even though solutions like hydrogen peroxide kill bacteria, they also damage cells lining the pin sites that help fight infection.  Applying lotions or ointments can keep the wounds moist and can cause them to breakdown and open up as well. This can increase the risk for infection. When in doubt call the office.  Surgical incisions should be dressed daily.  If any drainage is noted, use one layer of adaptic, then gauze, Kerlix, and an ace wrap.  Once the incision is completely dry and without drainage, it may be left open to air out.  Showering may begin 36-48 hours later.  Cleaning gently with soap and water.  Traumatic wounds should be dressed daily as well.    One layer of adaptic, gauze, Kerlix, then ace wrap.  The adaptic can be discontinued once the draining has ceased    If you have a wet to dry dressing: wet the gauze with saline the squeeze as much saline out so the gauze is moist (not soaking wet), place moistened gauze over wound, then place a dry gauze over the moist one, followed by Kerlix wrap, then ace wrap.

## 2019-08-14 LAB — CBC
HCT: 22.7 % — ABNORMAL LOW (ref 36.0–46.0)
Hemoglobin: 7 g/dL — ABNORMAL LOW (ref 12.0–15.0)
MCH: 29.3 pg (ref 26.0–34.0)
MCHC: 30.8 g/dL (ref 30.0–36.0)
MCV: 95 fL (ref 80.0–100.0)
Platelets: 215 10*3/uL (ref 150–400)
RBC: 2.39 MIL/uL — ABNORMAL LOW (ref 3.87–5.11)
RDW: 13.7 % (ref 11.5–15.5)
WBC: 7.4 10*3/uL (ref 4.0–10.5)
nRBC: 0 % (ref 0.0–0.2)

## 2019-08-14 LAB — BASIC METABOLIC PANEL
Anion gap: 8 (ref 5–15)
BUN: 30 mg/dL — ABNORMAL HIGH (ref 8–23)
CO2: 28 mmol/L (ref 22–32)
Calcium: 8.1 mg/dL — ABNORMAL LOW (ref 8.9–10.3)
Chloride: 104 mmol/L (ref 98–111)
Creatinine, Ser: 0.92 mg/dL (ref 0.44–1.00)
GFR calc Af Amer: 59 mL/min — ABNORMAL LOW (ref >60)
GFR calc non Af Amer: 51 mL/min — ABNORMAL LOW (ref >60)
Glucose, Bld: 115 mg/dL — ABNORMAL HIGH (ref 70–99)
Potassium: 4.1 mmol/L (ref 3.5–5.1)
Sodium: 140 mmol/L (ref 135–145)

## 2019-08-14 MED ORDER — HYDROCODONE-ACETAMINOPHEN 5-325 MG PO TABS
1.0000 | ORAL_TABLET | Freq: Four times a day (QID) | ORAL | Status: DC | PRN
  Administered 2019-08-15 – 2019-08-16 (×2): 1 via ORAL
  Filled 2019-08-14 (×2): qty 1

## 2019-08-14 NOTE — Progress Notes (Addendum)
Progress Note   Rotunda Rutigliano [id number] DOB: 12-31-1915 DOA: 08/10/2019  PCP: Leanna Battles, MD Consultants:  Percell Miller - orthopedics Patient coming from: [place]; NOK: [name], [phone]  Chief Complaint: fall  HPI: Vanessa Knox is a 85 y.o. female with medical history significant of HTN and spinal stenosis presenting with a mechanical fall.  She also fell in Dec, resulting in a R intertrochanteric hip fracture.  She was discharged to [facility] for rehab and returned back home again.  Today, she got up to use the bathroom and slipped, causing her walker to pull her down.  She had a head laceration as well as R knee injury.  No LOC.  Felt well prior to the fall.  In ED patient was found to have supracondylar femur fracture. Ortho (Dr. Percell Miller) planning for OR this morning.   Subjective: No acute issues or events overnight, pain well controlled. Denies chest pain, shortness of breath, nausea, vomiting, diarrhea, constipation, headache, fevers, chills.  Assessment/Plan Principal Problem:   Supracondylar fracture of femur, right, open type I or II, initial encounter [facility]) Active Problems:   HTN (hypertension)   Supracondylar open R femur fracture s/p fall -Mechanical fall s/p open reduction internal fixation of right distal femur fracture; Debridement and closure of right knee skin tear on 08/11/19 -Lovenox 30 daily per orthopedics -Pain control with Robxain, Vicodin, DC iv morphine -SW/PT/OT on going evaluation - patient requesting home with homehealth - likely to be set up in the next 24-48h pending insurance and approval  Acute on chronic anemia: likely blood loss anemia in the setting of chronic anemia of chronic disease  -Hemoglobin stable at 7.0 today (7.1 yesterday) -Consider transfusion if Hgb <7.0 or patient symptomatic with PT later today - appears well and no complaints currently. -Baseline hemoglobin appears to be around 8 per chart  review  HTN, essential, well-controlled -Continue Toprol XL  DVT prophylaxis: Lovenox Code Status:  DNR Family Communication: Attempted to call Son x2 today - will try again this evening *update - son updated about patient's condition and request for DC home with homehealth - he is somewhat concerned about her safety at home - told him we will continue PT here and if she appears unsafe for discharge we can always offer SNF/Rehab if patient is willing to go (currently she is not). Disposition Plan: Pending clinical status and postsurgical ambulatory status -likely home with home health given patient/family discussion Consults: Orthopedics Admission status: Inpatient - continues to require postoperative care, pain control, physical therapy, pending disposition in the next 24 to 48 hours pending clinical course.  Ambulatory Status:  Ambulates with a walker prior to admission  Physical Exam: Vitals:   08/13/19 0832 08/13/19 1848 08/13/19 1943 08/14/19 0414  BP: 106/80 (!) 108/56 105/60 121/64  Pulse: 89 87 83 85  Resp:   16 15  Temp: 98.1 F (36.7 C) 98.7 F (37.1 C) 98.2 F (36.8 C) 98.3 F (36.8 C)  TempSrc: Oral Oral Oral Oral  SpO2: 95% 98% 98% 94%  Weight:      Height:        General:  Pleasantly resting in bed, No acute distress. HEENT: Scalp laceration clean dry and staples intact, no ongoing bleeding. Normocephalic atraumatic.  Sclerae nonicteric, noninjected.  Extraocular movements intact bilaterally. Neck:  Without mass or deformity.  Trachea is midline. Lungs:  Clear to auscultate bilaterally without rhonchi, wheeze, or rales. Heart:  Regular rate and rhythm.  Without murmurs, rubs, or gallops.  Abdomen:  Soft, nontender, nondistended.  Without guarding or rebound. Extremities: Multiple bandages clean dry intact.  Without cyanosis, clubbing, edema, or obvious deformity, sensation intact. Vascular:  Dorsalis pedis and posterior tibial pulses palpable bilaterally.  Data  Reviewed: I have personally reviewed following labs and imaging studies  CBC: Recent Labs  Lab 08/10/19 0722 08/10/19 0804 08/11/19 0414 08/12/19 0429 08/13/19 0457 08/14/19 0338  WBC QUESTIONABLE IDENTIFICATION / INCORRECTLY LABELED SPECIMEN 14.9* 8.8 9.3 7.2 7.4  NEUTROABS QUESTIONABLE IDENTIFICATION / INCORRECTLY LABELED SPECIMEN 12.9*  --   --   --   --   HGB QUESTIONABLE IDENTIFICATION / INCORRECTLY LABELED SPECIMEN 10.3* 8.4* 7.7* 7.1* 7.0*  HCT QUESTIONABLE IDENTIFICATION / INCORRECTLY LABELED SPECIMEN 33.4* 27.3* 24.8* 22.8* 22.7*  MCV QUESTIONABLE IDENTIFICATION / INCORRECTLY LABELED SPECIMEN 95.4 94.8 92.5 95.0 95.0  PLT QUESTIONABLE IDENTIFICATION / INCORRECTLY LABELED SPECIMEN 248 207 198 [id number]   Basic Metabolic Panel: Recent Labs  Lab 08/10/19 0804 08/11/19 0414 08/12/19 0429 08/13/19 0457 08/14/19 0338  NA 141 138 138 140 140  K 3.9 4.1 4.3 4.2 4.1  CL 105 102 102 105 104  CO2 27 28 28 26 28   GLUCOSE 165* 123* 113* 104* 115*  BUN 23 27* 21 28* 30*  CREATININE 1.04* 1.18* 0.97 0.98 0.92  CALCIUM 8.4* 8.1* 8.0* 7.9* 8.1*   Liver Function Tests: Recent Labs  Lab 08/10/19 0804  AST 17  ALT 13  ALKPHOS 70  BILITOT 0.7  PROT 5.6*  ALBUMIN 3.3*   Coagulation Profile: Recent Labs  Lab 08/10/19 0804  INR 1.2   CBG: Recent Labs  Lab 08/10/19 0801  GLUCAP 134*   Recent Results (from the past 240 hour(s))  SARS CORONAVIRUS 2 (TAT 6-12 HRS) Nasal Swab Aptima Multi Swab     Status: None   Collection Time: 08/10/19  6:08 AM   Specimen: Aptima Multi Swab; Nasal Swab  Result Value Ref Range Status   SARS Coronavirus 2 NEGATIVE NEGATIVE Final    Comment: (NOTE) SARS-CoV-2 target nucleic acids are NOT DETECTED. The SARS-CoV-2 RNA is generally detectable in upper and lower respiratory specimens during the acute phase of infection. Negative results do not preclude SARS-CoV-2 infection, do not rule out co-infections with other pathogens, and should not  be used as the sole basis for treatment or other patient management decisions. Negative results must be combined with clinical observations, patient history, and epidemiological information. The expected result is Negative. Fact Sheet for Patients: SugarRoll.be Fact Sheet for Healthcare Providers: https://www.woods-mathews.com/ This test is not yet approved or cleared by the [place] FDA and  has been authorized for detection and/or diagnosis of SARS-CoV-2 by FDA under an Emergency Use Authorization (EUA). This EUA will remain  in effect (meaning this test can be used) for the duration of the COVID-19 declaration under Section 56 4(b)(1) of the Act, 21 U.S.C. section 360bbb-3(b)(1), unless the authorization is terminated or revoked sooner. Performed at [facility], [place] [address] [place], [place] [address]   MRSA PCR Screening     Status: Abnormal   Collection Time: 08/11/19  3:07 AM   Specimen: Nasal Mucosa; Nasopharyngeal  Result Value Ref Range Status   MRSA by PCR POSITIVE (A) NEGATIVE Final    Comment:        The GeneXpert MRSA Assay (FDA approved for NASAL specimens only), is one component of a comprehensive MRSA colonization surveillance program. It is not intended to diagnose MRSA infection nor to guide or monitor treatment for  MRSA infections. ATAMBILE,J RN 08/11/2019 AT [id number] SKEEN,P Performed at [facility], [place] [address] [place], [place] [address]      Radiology Studies: No results found.  Scheduled Meds: . cholecalciferol  2,000 Units Oral Daily  . docusate sodium  100 mg Oral BID  . enoxaparin (LOVENOX) injection  30 mg Subcutaneous Q24H  . feeding supplement (ENSURE ENLIVE)  237 mL Oral BID BM  . metoprolol succinate  25 mg Oral Daily  . mupirocin ointment  1 application Topical BID   Continuous Infusions: . methocarbamol (ROBAXIN) IV    . sodium chloride       LOS: 4 days    Time  spent: 38min  Roberto Romanoski C Despina Boan, DO [facility]  If 7PM-7AM, please contact night-coverage [email or web address] Password [email or web address] 08/14/2019, 12:12 PM

## 2019-08-14 NOTE — Plan of Care (Signed)
  Problem: Clinical Measurements: Goal: Diagnostic test results will improve Outcome: Progressing   Problem: Activity: Goal: Risk for activity intolerance will decrease Outcome: Progressing   Problem: Clinical Measurements: Goal: Cardiovascular complication will be avoided Outcome: Progressing   Problem: Clinical Measurements: Goal: Respiratory complications will improve Outcome: Progressing   Problem: Clinical Measurements: Goal: Will remain free from infection Outcome: Progressing   Problem: Clinical Measurements: Goal: Ability to maintain clinical measurements within normal limits will improve Outcome: Progressing   Problem: Clinical Measurements: Goal: Diagnostic test results will improve Outcome: Progressing

## 2019-08-14 NOTE — Plan of Care (Signed)

## 2019-08-14 NOTE — Progress Notes (Signed)
Physical Therapy Treatment Patient Details Name: Vanessa Knox MRN: [id number] DOB: 09-20-1916 Today's Date: 08/14/2019    History of Present Illness Pt is a 85 y.o. female admitted from independent living facility on 08/10/19 after fall; pt sustained forehead lacerations and R distal femur fx s/p ORIF 8/26. PMH includes HTN, sciatic, R rotator cuff repair; previous R femur IM nail (11/2018).    PT Comments    Patient seen for mobility progression. Pt is making gradual progress toward PT goals. Pt requires min/mod A +2 for functional transfer training. Continue to progress as tolerated.    Follow Up Recommendations  SNF;Supervision/Assistance - 24 hour     Equipment Recommendations  Rolling walker with 5" wheels    Recommendations for Other Services       Precautions / Restrictions Precautions Precautions: Fall Restrictions Weight Bearing Restrictions: Yes RLE Weight Bearing: WBAT  Per last orthopedics note   Mobility  Bed Mobility Overal bed mobility: Needs Assistance Bed Mobility: Supine to Sit     Supine to sit: Max assist     General bed mobility comments: MaxA to scoot hips to EOB and elevate trunk, pt initiating movement well with cues and use of bed rail  Transfers Overall transfer level: Needs assistance Equipment used: Rolling walker (2 wheeled) Transfers: Sit to/from [facility] Sit to Stand: Min assist;+2 physical assistance Stand pivot transfers: Mod assist;+2 safety/equipment       General transfer comment: assist to power up into standing and for balance and guiding RW for pivot to recliner  Ambulation/Gait             General Gait Details: deferred gait training due to pain with standing   Stairs             Wheelchair Mobility    Modified Rankin (Stroke Patients Only)       Balance Overall balance assessment: Needs assistance Sitting-balance support: Bilateral upper extremity supported;Feet supported Sitting  balance-Leahy Scale: Fair       Standing balance-Leahy Scale: Poor Standing balance comment: Reliant on UE support and external assist                            Cognition Arousal/Alertness: Awake/alert Behavior During Therapy: WFL for tasks assessed/performed Overall Cognitive Status: Within Functional Limits for tasks assessed                                 General Comments: WFL for simple mobility tasks       Exercises      General Comments General comments (skin integrity, edema, etc.): pt reports having painful heels PTA and with erythema      Pertinent Vitals/Pain Pain Assessment: Faces Faces Pain Scale: Hurts little more Pain Location: R LE and heels Pain Descriptors / Indicators: Guarding;Grimacing Pain Intervention(s): Limited activity within patient's tolerance;Monitored during session;Repositioned    Home Living                      Prior Function            PT Goals (current goals can now be found in the care plan section) Acute Rehab PT Goals Patient Stated Goal: Hopes to return to ILF instead of needing SNF Progress towards PT goals: Progressing toward goals    Frequency    Min 5X/week      PT Plan Current  plan remains appropriate    Co-evaluation              AM-PAC PT "6 Clicks" Mobility   Outcome Measure  Help needed turning from your back to your side while in a flat bed without using bedrails?: A Lot Help needed moving from lying on your back to sitting on the side of a flat bed without using bedrails?: A Lot Help needed moving to and from a bed to a chair (including a wheelchair)?: A Lot Help needed standing up from a chair using your arms (e.g., wheelchair or bedside chair)?: A Lot Help needed to walk in hospital room?: A Lot Help needed climbing 3-5 steps with a railing? : Total 6 Click Score: 11    End of Session Equipment Utilized During Treatment: Gait belt Activity Tolerance: Patient  limited by pain Patient left: in chair;with call bell/phone within reach;with chair alarm set Nurse Communication: Mobility status PT Visit Diagnosis: Other abnormalities of gait and mobility (R26.89);Pain;Muscle weakness (generalized) (M62.81) Pain - Right/Left: Left Pain - part of body: Leg     Time: 1438-1500 PT Time Calculation (min) (ACUTE ONLY): 22 min  Charges:  $Gait Training: 8-22 mins                     Earney Navy, PTA Acute Rehabilitation Services Pager: [phone] Office: [phone]     Darliss Cheney 08/14/2019, 5:03 PM

## 2019-08-15 LAB — CBC
HCT: 22.3 % — ABNORMAL LOW (ref 36.0–46.0)
Hemoglobin: 6.8 g/dL — CL (ref 12.0–15.0)
MCH: 28.9 pg (ref 26.0–34.0)
MCHC: 30.5 g/dL (ref 30.0–36.0)
MCV: 94.9 fL (ref 80.0–100.0)
Platelets: 244 10*3/uL (ref 150–400)
RBC: 2.35 MIL/uL — ABNORMAL LOW (ref 3.87–5.11)
RDW: 13.8 % (ref 11.5–15.5)
WBC: 6.3 10*3/uL (ref 4.0–10.5)
nRBC: 0 % (ref 0.0–0.2)

## 2019-08-15 LAB — BASIC METABOLIC PANEL
Anion gap: 7 (ref 5–15)
BUN: 27 mg/dL — ABNORMAL HIGH (ref 8–23)
CO2: 28 mmol/L (ref 22–32)
Calcium: 8 mg/dL — ABNORMAL LOW (ref 8.9–10.3)
Chloride: 106 mmol/L (ref 98–111)
Creatinine, Ser: 0.87 mg/dL (ref 0.44–1.00)
GFR calc Af Amer: >60 mL/min (ref >60)
GFR calc non Af Amer: 54 mL/min — ABNORMAL LOW (ref >60)
Glucose, Bld: 100 mg/dL — ABNORMAL HIGH (ref 70–99)
Potassium: 4.2 mmol/L (ref 3.5–5.1)
Sodium: 141 mmol/L (ref 135–145)

## 2019-08-15 LAB — HEMOGLOBIN AND HEMATOCRIT, BLOOD
HCT: 33.4 % — ABNORMAL LOW (ref 36.0–46.0)
Hemoglobin: 11.1 g/dL — ABNORMAL LOW (ref 12.0–15.0)

## 2019-08-15 LAB — PREPARE RBC (CROSSMATCH)

## 2019-08-15 MED ORDER — FUROSEMIDE 10 MG/ML IJ SOLN
20.0000 mg | Freq: Once | INTRAMUSCULAR | Status: AC
  Administered 2019-08-15: 20 mg via INTRAVENOUS
  Filled 2019-08-15: qty 2

## 2019-08-15 MED ORDER — SODIUM CHLORIDE 0.9% IV SOLUTION
Freq: Once | INTRAVENOUS | Status: AC
  Administered 2019-08-15: 10 mL via INTRAVENOUS

## 2019-08-15 NOTE — Progress Notes (Signed)
CRITICAL VALUE STICKER  CRITICAL VALUE: 6.8  RECEIVER (on-site recipient of call): Thamas Jaegers   DATE & TIME NOTIFIED: 08/15/2019 at [place] (representative from lab): Lab  MD NOTIFIED: Text paged [facility]  TIME OF NOTIFICATION: [id number]  RESPONSE: Waiting on Response.  Text page requesting type and screen order if plan to transfuse. Has been over 3 days since last type and screen. Patient's vitals are stable, no bleeding noted, and denies complaints.

## 2019-08-15 NOTE — Progress Notes (Signed)
MD on call aware of drop in hemoglobin.  Order for type and screen in for this morning.

## 2019-08-15 NOTE — Progress Notes (Signed)
Progress Note   Vanessa Knox [id number] DOB: [birth year] DOA: 08/10/2019  PCP: Leanna Battles, MD Consultants:  Percell Miller - orthopedics Patient coming from: [facility]; NOK: Melida Gimenez [place], [phone]  Chief Complaint: fall  HPI: Vanessa Knox is a 85 y.o. female with medical history significant of HTN and spinal stenosis presenting with a mechanical fall.  She also fell in Dec, resulting in a R intertrochanteric hip fracture.  She was discharged to [facility] for rehab and returned back home again.  Today, she got up to use the bathroom and slipped, causing her walker to pull her down.  She had a head laceration as well as R knee injury.  No LOC.  Felt well prior to the fall.  In ED patient was found to have supracondylar femur fracture. Ortho (Dr. Percell Miller) planning for OR this morning.   Subjective: No acute issues or events overnight, pain well controlled. Denies chest pain, shortness of breath, nausea, vomiting, diarrhea, constipation, headache, fevers, chills.  Assessment/Plan Principal Problem:   Supracondylar fracture of femur, right, open type I or II, initial encounter [facility]) Active Problems:   HTN (hypertension)   Supracondylar open R femur fracture s/p fall -Mechanical fall s/p open reduction internal fixation of right distal femur fracture; Debridement and closure of right knee skin tear on 08/11/19 -Lovenox 30 daily per orthopedics -Pain control with Robxain, Vicodin, DC iv morphine -SW/PT/OT on going evaluation - patient requesting home with homehealth - likely to be set up in the next 24-48h pending insurance and approval  Acute on chronic anemia: likely blood loss anemia in the setting of chronic anemia of chronic disease  -Hemoglobin continues to downtrend slightly, 6.8 this morning, transfuse 2 unit PRBC today -Baseline hemoglobin appears to be around 8 per chart review; acute anemia in the setting of surgery and daily phlebotomy  HTN,  essential, well-controlled -Continue Toprol XL  DVT prophylaxis: Lovenox Code Status:  DNR Family Communication: Son updated over the phone Disposition Plan: Pending clinical status and postsurgical ambulatory status -likely home with home health given patient/family discussion Consults: Orthopedics Admission status: Inpatient - continues to require postoperative care, pain control, physical therapy, pending disposition in the next 24 to 48 hours pending clinical course and safe disposition location  Ambulatory Status:  Ambulates with a walker prior to admission  Physical Exam: Vitals:   08/14/19 1631 08/14/19 1958 08/14/19 2028 08/15/19 0313  BP: (!) 146/59 (!) 122/55 (!) 144/88 (!) 157/60  Pulse: 88 93 92 80  Resp:  18 18 17   Temp: 98.3 F (36.8 C) 98.4 F (36.9 C) 98.9 F (37.2 C) 98.3 F (36.8 C)  TempSrc: Oral Oral Oral Oral  SpO2: 96% 95% 96% 97%  Weight:      Height:        General:  Pleasantly resting in bed, No acute distress. HEENT: Scalp laceration clean dry and staples intact, no ongoing bleeding. Normocephalic atraumatic.  Sclerae nonicteric, noninjected.  Extraocular movements intact bilaterally. Neck:  Without mass or deformity.  Trachea is midline. Lungs:  Clear to auscultate bilaterally without rhonchi, wheeze, or rales. Heart:  Regular rate and rhythm.  Without murmurs, rubs, or gallops. Abdomen:  Soft, nontender, nondistended.  Without guarding or rebound. Extremities: Multiple bandages clean dry intact.  Without cyanosis, clubbing, edema, or obvious deformity, sensation intact. Vascular:  Dorsalis pedis and posterior tibial pulses palpable bilaterally.  Data Reviewed: I have personally reviewed following labs and imaging studies  CBC: Recent Labs  Lab 08/10/19 0722 08/10/19  [id number] 08/11/19 0414 08/12/19 0429 08/13/19 0457 08/14/19 0338 08/15/19 0428  WBC QUESTIONABLE IDENTIFICATION / INCORRECTLY LABELED SPECIMEN 14.9* 8.8 9.3 7.2 7.4 6.3  NEUTROABS  QUESTIONABLE IDENTIFICATION / INCORRECTLY LABELED SPECIMEN 12.9*  --   --   --   --   --   HGB QUESTIONABLE IDENTIFICATION / INCORRECTLY LABELED SPECIMEN 10.3* 8.4* 7.7* 7.1* 7.0* 6.8*  HCT QUESTIONABLE IDENTIFICATION / INCORRECTLY LABELED SPECIMEN 33.4* 27.3* 24.8* 22.8* 22.7* 22.3*  MCV QUESTIONABLE IDENTIFICATION / INCORRECTLY LABELED SPECIMEN 95.4 94.8 92.5 95.0 95.0 94.9  PLT QUESTIONABLE IDENTIFICATION / INCORRECTLY LABELED SPECIMEN 248 207 198 193 215 [id number]   Basic Metabolic Panel: Recent Labs  Lab 08/11/19 0414 08/12/19 0429 08/13/19 0457 08/14/19 0338 08/15/19 0428  NA 138 138 140 140 141  K 4.1 4.3 4.2 4.1 4.2  CL 102 102 105 104 106  CO2 28 28 26 28 28   GLUCOSE 123* 113* 104* 115* 100*  BUN 27* 21 28* 30* 27*  CREATININE 1.18* 0.97 0.98 0.92 0.87  CALCIUM 8.1* 8.0* 7.9* 8.1* 8.0*   Liver Function Tests: Recent Labs  Lab 08/10/19 0804  AST 17  ALT 13  ALKPHOS 70  BILITOT 0.7  PROT 5.6*  ALBUMIN 3.3*   Coagulation Profile: Recent Labs  Lab 08/10/19 0804  INR 1.2   CBG: Recent Labs  Lab 08/10/19 0801  GLUCAP 134*   Recent Results (from the past 240 hour(s))  SARS CORONAVIRUS 2 (TAT 6-12 HRS) Nasal Swab Aptima Multi Swab     Status: None   Collection Time: 08/10/19  6:08 AM   Specimen: Aptima Multi Swab; Nasal Swab  Result Value Ref Range Status   SARS Coronavirus 2 NEGATIVE NEGATIVE Final    Comment: (NOTE) SARS-CoV-2 target nucleic acids are NOT DETECTED. The SARS-CoV-2 RNA is generally detectable in upper and lower respiratory specimens during the acute phase of infection. Negative results do not preclude SARS-CoV-2 infection, do not rule out co-infections with other pathogens, and should not be used as the sole basis for treatment or other patient management decisions. Negative results must be combined with clinical observations, patient history, and epidemiological information. The expected result is Negative. Fact Sheet for Patients:  SugarRoll.be Fact Sheet for Healthcare Providers: https://www.woods-mathews.com/ This test is not yet approved or cleared by the [place] FDA and  has been authorized for detection and/or diagnosis of SARS-CoV-2 by FDA under an Emergency Use Authorization (EUA). This EUA will remain  in effect (meaning this test can be used) for the duration of the COVID-19 declaration under Section 56 4(b)(1) of the Act, 21 U.S.C. section 360bbb-3(b)(1), unless the authorization is terminated or revoked sooner. Performed at [facility], [place] [address], [place], [place] [address]   MRSA PCR Screening     Status: Abnormal   Collection Time: 08/11/19  3:07 AM   Specimen: Nasal Mucosa; Nasopharyngeal  Result Value Ref Range Status   MRSA by PCR POSITIVE (A) NEGATIVE Final    Comment:        The GeneXpert MRSA Assay (FDA approved for NASAL specimens only), is one component of a comprehensive MRSA colonization surveillance program. It is not intended to diagnose MRSA infection nor to guide or monitor treatment for MRSA infections. ATAMBILE,J RN 08/11/2019 AT [id number] SKEEN,P Performed at [facility], [place] [address], [place], [place] [address]      Radiology Studies: No results found.  Scheduled Meds: . sodium chloride   Intravenous Once  . cholecalciferol  2,000 Units  Oral Daily  . docusate sodium  100 mg Oral BID  . enoxaparin (LOVENOX) injection  30 mg Subcutaneous Q24H  . feeding supplement (ENSURE ENLIVE)  237 mL Oral BID BM  . furosemide  20 mg Intravenous Once  . furosemide  20 mg Intravenous Once  . metoprolol succinate  25 mg Oral Daily  . mupirocin ointment  1 application Topical BID   Continuous Infusions: . methocarbamol (ROBAXIN) IV    . sodium chloride       LOS: 5 days   Time spent: 78min  Aharon Carriere C Mayline Dragon, DO [facility]  If 7PM-7AM, please contact night-coverage [email or web address] Password [email or web address]  08/15/2019, 7:34 AM

## 2019-08-16 DIAGNOSIS — S81011A Laceration without foreign body, right knee, initial encounter: Secondary | ICD-10-CM

## 2019-08-16 LAB — CBC
HCT: 34.5 % — ABNORMAL LOW (ref 36.0–46.0)
Hemoglobin: 11.3 g/dL — ABNORMAL LOW (ref 12.0–15.0)
MCH: 29.4 pg (ref 26.0–34.0)
MCHC: 32.8 g/dL (ref 30.0–36.0)
MCV: 89.8 fL (ref 80.0–100.0)
Platelets: 263 10*3/uL (ref 150–400)
RBC: 3.84 MIL/uL — ABNORMAL LOW (ref 3.87–5.11)
RDW: 14.4 % (ref 11.5–15.5)
WBC: 7.4 10*3/uL (ref 4.0–10.5)
nRBC: 0 % (ref 0.0–0.2)

## 2019-08-16 LAB — TYPE AND SCREEN
ABO/RH(D): A POS
Antibody Screen: NEGATIVE
Unit division: 0
Unit division: 0

## 2019-08-16 LAB — BASIC METABOLIC PANEL
Anion gap: 10 (ref 5–15)
BUN: 29 mg/dL — ABNORMAL HIGH (ref 8–23)
CO2: 28 mmol/L (ref 22–32)
Calcium: 8.2 mg/dL — ABNORMAL LOW (ref 8.9–10.3)
Chloride: 103 mmol/L (ref 98–111)
Creatinine, Ser: 0.98 mg/dL (ref 0.44–1.00)
GFR calc Af Amer: 54 mL/min — ABNORMAL LOW (ref >60)
GFR calc non Af Amer: 47 mL/min — ABNORMAL LOW (ref >60)
Glucose, Bld: 118 mg/dL — ABNORMAL HIGH (ref 70–99)
Potassium: 4.1 mmol/L (ref 3.5–5.1)
Sodium: 141 mmol/L (ref 135–145)

## 2019-08-16 LAB — BPAM RBC
Blood Product Expiration Date: 202009192359
Blood Product Expiration Date: 202009192359
ISSUE DATE / TIME: 202008300842
ISSUE DATE / TIME: 202008301554
Unit Type and Rh: 6200
Unit Type and Rh: 6200

## 2019-08-16 NOTE — Progress Notes (Signed)
Physical Therapy Treatment Patient Details Name: Vanessa Knox MRN: [id number] DOB: November 02, 1916 Today's Date: 08/16/2019    History of Present Illness Pt is a 85 y.o. female admitted from independent living facility on 08/10/19 after fall; pt sustained forehead lacerations and R distal femur fx s/p ORIF 8/26. PMH includes HTN, sciatic, R rotator cuff repair; previous R femur IM nail (11/2018).    PT Comments    Pt progressing with functional mobility, requiring mod assist for boost up to standing and min assist for short distance gait and transfers; requiring assist for all ADLS. Pt recognizes that she needs assistance and is not yet ready to go home to ILF however pt is expecting to stay admitted until she is ready to return home. Pt also thinks that she will have 24/7 assist from her ILF. Continue to recommend SNF level rehabilitation to maximize independence, pt aware of recommendation.     Follow Up Recommendations  SNF;Supervision/Assistance - 24 hour(If declining, will need max HH services and 24/7 assist)     Equipment Recommendations  Rolling walker with 5" wheels    Recommendations for Other Services       Precautions / Restrictions Precautions Precautions: Fall Restrictions Weight Bearing Restrictions: Yes RLE Weight Bearing: Weight bearing as tolerated    Mobility  Bed Mobility Overal bed mobility: Needs Assistance Bed Mobility: Supine to Sit;Rolling Rolling: Min assist(bed flat, use of bedrails)   Supine to sit: Min assist(bed flat, bedrails)     General bed mobility comments: Min assist for LE management with rolling, and to elevate trunk during supine>sit, cues and use of bedrails  Transfers Overall transfer level: Needs assistance Equipment used: Rolling walker (2 wheeled) Transfers: Sit to/from Stand Sit to Stand: Min assist;Mod assist         General transfer comment: initially requiring min assist for standing, needed increased assist with  fatigue  Ambulation/Gait Ambulation/Gait assistance: Min assist Gait Distance (Feet): 4 Feet Assistive device: Rolling walker (2 wheeled) Gait Pattern/deviations: Step-to pattern;Leaning posteriorly;Trunk flexed;Decreased weight shift to right;Antalgic Gait velocity: decreased Gait velocity interpretation: <1.31 ft/sec, indicative of household ambulator General Gait Details: Pivotal steps to the recliner, seated rest break needed secondary to fatigue and shortness of breath. Pt able to ambulate x 4 ft with chair follow, further distance limited secondary to pain and fatigue   Stairs             Wheelchair Mobility    Modified Rankin (Stroke Patients Only)       Balance Overall balance assessment: Needs assistance Sitting-balance support: Bilateral upper extremity supported;Feet supported Sitting balance-Leahy Scale: Fair     Standing balance support: Bilateral upper extremity supported;During functional activity Standing balance-Leahy Scale: Poor Standing balance comment: Reliant on UE support and required assist for pericare secondary to this                            Cognition Arousal/Alertness: Awake/alert Behavior During Therapy: WFL for tasks assessed/performed;Anxious Overall Cognitive Status: No family/caregiver present to determine baseline cognitive functioning Area of Impairment: Memory;Problem solving;Orientation;Safety/judgement                     Memory: Decreased short-term memory   Safety/Judgement: Decreased awareness of deficits   Problem Solving: Slow processing;Difficulty sequencing;Requires verbal cues General Comments: WFL for simple mobility tasks       Exercises General Exercises - Lower Extremity Ankle Circles/Pumps: AROM;Both;Seated Long Arc Quad: AROM;Both;Seated  General Comments        Pertinent Vitals/Pain Pain Assessment: Faces Faces Pain Scale: Hurts little more Pain Location: R LE Pain Descriptors  / Indicators: Guarding;Grimacing Pain Intervention(s): Monitored during session;Repositioned;Limited activity within patient's tolerance    Home Living                      Prior Function            PT Goals (current goals can now be found in the care plan section) Progress towards PT goals: Progressing toward goals    Frequency    Min 5X/week      PT Plan Current plan remains appropriate    Co-evaluation              AM-PAC PT "6 Clicks" Mobility   Outcome Measure  Help needed turning from your back to your side while in a flat bed without using bedrails?: A Little Help needed moving from lying on your back to sitting on the side of a flat bed without using bedrails?: A Lot Help needed moving to and from a bed to a chair (including a wheelchair)?: A Little Help needed standing up from a chair using your arms (e.g., wheelchair or bedside chair)?: A Lot Help needed to walk in hospital room?: A Little Help needed climbing 3-5 steps with a railing? : Total 6 Click Score: 14    End of Session Equipment Utilized During Treatment: Gait belt Activity Tolerance: Patient limited by pain Patient left: in chair;with call bell/phone within reach;with chair alarm set Nurse Communication: Mobility status PT Visit Diagnosis: Other abnormalities of gait and mobility (R26.89);Pain;Muscle weakness (generalized) (M62.81) Pain - Right/Left: Right Pain - part of body: Leg     Time: 1036-1100 PT Time Calculation (min) (ACUTE ONLY): 24 min  Charges:  $Gait Training: 8-22 mins $Therapeutic Activity: 8-22 mins                     Netta Corrigan, PT, DPT 08/16/19  11:26 AM Acute Rehab Office [place] 08/16/2019, 11:21 AM

## 2019-08-16 NOTE — Progress Notes (Signed)
Progress Note   Vanessa Knox [id number] DOB: 1916/03/02 DOA: 08/10/2019  PCP: Leanna Battles, MD Consultants:  Percell Miller - orthopedics Patient coming from: [place]; NOK: Melida Gimenez [place], [phone]  Chief Complaint: fall  HPI: Vanessa Knox is a 85 y.o. female with medical history significant of HTN and spinal stenosis presenting with a mechanical fall.  She also fell in Dec, resulting in a R intertrochanteric hip fracture.  She was discharged to [facility] for rehab and returned back home again.  Today, she got up to use the bathroom and slipped, causing her walker to pull her down.  She had a head laceration as well as R knee injury.  No LOC.  Felt well prior to the fall.  In ED patient was found to have supracondylar femur fracture. Ortho (Dr. Percell Miller) planning for OR this morning.   Subjective: No acute issues or events overnight, pain well controlled. Denies chest pain, shortness of breath, nausea, vomiting, diarrhea, constipation, headache, fevers, chills.  Assessment/Plan Principal Problem:   Supracondylar fracture of femur, right, open type I or II, initial encounter ([place]) Active Problems:   HTN (hypertension)   Supracondylar open R femur fracture s/p fall -Mechanical fall s/p open reduction internal fixation of right distal femur fracture; Debridement and closure of right knee skin tear on 08/11/19 -Lovenox 30 daily per orthopedics -Pain control with Robxain, Vicodin, previously discontinued iv morphine -SW/PT/OT on going evaluation - patient requesting home with homehealth -personally patient continues to require more assistance than is available at home requiring 24-hour supervision.  Lengthy discussion today with case management and son as well as patient that SNF/rehab is likely our only option in the interim, this management working on disposition location once family and insurance approve.  Acute on chronic anemia: likely blood loss anemia in  the setting of chronic anemia of chronic disease  -Hemoglobin today 11.3 that is post 2 unit PRBC 08/15/2019 -Baseline hemoglobin appears to be around 8 per chart review; acute anemia in the setting of surgery and daily phlebotomy  HTN, essential, well-controlled -Continue Toprol XL  DVT prophylaxis: Lovenox Code Status:  DNR Family Communication: Son updated over the phone Disposition Plan: Pending clinical status and postsurgical ambulatory status -likely SNF given patient unable to discharge home with home health given her needs exceed available resources requiring 24-hour care sitter with 2+ assist Consults: Orthopedics Admission status: Inpatient - continues to require postoperative care, pain control, physical therapy, position pending safe discharge, likely SNF at this point as above  Ambulatory Status:  Ambulates with a walker prior to admission  Physical Exam: Vitals:   08/15/19 1930 08/15/19 2018 08/16/19 0515 08/16/19 0811  BP: (!) 125/53 139/63 (!) 167/69 (!) 149/65  Pulse: 89 85 77 80  Resp: 16  20 17   Temp: 98.2 F (36.8 C) 97.6 F (36.4 C) 98.1 F (36.7 C) 98.2 F (36.8 C)  TempSrc: Oral Oral Oral Oral  SpO2: 96% 96% 95% 93%  Weight:      Height:        General:  Pleasantly resting in bed, No acute distress. HEENT: Scalp laceration clean dry and staples intact, no ongoing bleeding. Normocephalic atraumatic.  Sclerae nonicteric, noninjected.  Extraocular movements intact bilaterally. Neck:  Without mass or deformity.  Trachea is midline. Lungs:  Clear to auscultate bilaterally without rhonchi, wheeze, or rales. Heart:  Regular rate and rhythm.  Without murmurs, rubs, or gallops. Abdomen:  Soft, nontender, nondistended.  Without guarding or rebound. Extremities: Multiple bandages clean  dry intact.  Without cyanosis, clubbing, edema, or obvious deformity, sensation intact. Vascular:  Dorsalis pedis and posterior tibial pulses palpable bilaterally.  Data Reviewed:  I have personally reviewed following labs and imaging studies  CBC: Recent Labs  Lab 08/10/19 0722 08/10/19 0804  08/12/19 0429 08/13/19 0457 08/14/19 0338 08/15/19 0428 08/15/19 2150 08/16/19 0502  WBC QUESTIONABLE IDENTIFICATION / INCORRECTLY LABELED SPECIMEN 14.9*   < > 9.3 7.2 7.4 6.3  --  7.4  NEUTROABS QUESTIONABLE IDENTIFICATION / INCORRECTLY LABELED SPECIMEN 12.9*  --   --   --   --   --   --   --   HGB QUESTIONABLE IDENTIFICATION / INCORRECTLY LABELED SPECIMEN 10.3*   < > 7.7* 7.1* 7.0* 6.8* 11.1* 11.3*  HCT QUESTIONABLE IDENTIFICATION / INCORRECTLY LABELED SPECIMEN 33.4*   < > 24.8* 22.8* 22.7* 22.3* 33.4* 34.5*  MCV QUESTIONABLE IDENTIFICATION / INCORRECTLY LABELED SPECIMEN 95.4   < > 92.5 95.0 95.0 94.9  --  89.8  PLT QUESTIONABLE IDENTIFICATION / INCORRECTLY LABELED SPECIMEN 248   < > 198 193 215 244  --  263   < > = values in this interval not displayed.   Basic Metabolic Panel: Recent Labs  Lab 08/12/19 0429 08/13/19 0457 08/14/19 0338 08/15/19 0428 08/16/19 0502  NA 138 140 140 141 141  K 4.3 4.2 4.1 4.2 4.1  CL 102 105 104 106 103  CO2 28 26 28 28 28   GLUCOSE 113* 104* 115* 100* 118*  BUN 21 28* 30* 27* 29*  CREATININE 0.97 0.98 0.92 0.87 0.98  CALCIUM 8.0* 7.9* 8.1* 8.0* 8.2*   Liver Function Tests: Recent Labs  Lab 08/10/19 0804  AST 17  ALT 13  ALKPHOS 70  BILITOT 0.7  PROT 5.6*  ALBUMIN 3.3*   Coagulation Profile: Recent Labs  Lab 08/10/19 0804  INR 1.2   CBG: Recent Labs  Lab 08/10/19 0801  GLUCAP 134*   Recent Results (from the past 240 hour(s))  SARS CORONAVIRUS 2 (TAT 6-12 HRS) Nasal Swab Aptima Multi Swab     Status: None   Collection Time: 08/10/19  6:08 AM   Specimen: Aptima Multi Swab; Nasal Swab  Result Value Ref Range Status   SARS Coronavirus 2 NEGATIVE NEGATIVE Final    Comment: (NOTE) SARS-CoV-2 target nucleic acids are NOT DETECTED. The SARS-CoV-2 RNA is generally detectable in upper and lower respiratory  specimens during the acute phase of infection. Negative results do not preclude SARS-CoV-2 infection, do not rule out co-infections with other pathogens, and should not be used as the sole basis for treatment or other patient management decisions. Negative results must be combined with clinical observations, patient history, and epidemiological information. The expected result is Negative. Fact Sheet for Patients: SugarRoll.be Fact Sheet for Healthcare Providers: https://www.woods-mathews.com/ This test is not yet approved or cleared by the [place] FDA and  has been authorized for detection and/or diagnosis of SARS-CoV-2 by FDA under an Emergency Use Authorization (EUA). This EUA will remain  in effect (meaning this test can be used) for the duration of the COVID-19 declaration under Section 56 4(b)(1) of the Act, 21 U.S.C. section 360bbb-3(b)(1), unless the authorization is terminated or revoked sooner. Performed at [facility], [place] [address], [place], [place] [address]   MRSA PCR Screening     Status: Abnormal   Collection Time: 08/11/19  3:07 AM   Specimen: Nasal Mucosa; Nasopharyngeal  Result Value Ref Range Status   MRSA by PCR POSITIVE (A) NEGATIVE  Final    Comment:        The GeneXpert MRSA Assay (FDA approved for NASAL specimens only), is one component of a comprehensive MRSA colonization surveillance program. It is not intended to diagnose MRSA infection nor to guide or monitor treatment for MRSA infections. ATAMBILE,J RN 08/11/2019 AT [id number] SKEEN,P Performed at [facility], [place] [address] [place], [place] [address]      Radiology Studies: No results found.  Scheduled Meds: . cholecalciferol  2,000 Units Oral Daily  . docusate sodium  100 mg Oral BID  . enoxaparin (LOVENOX) injection  30 mg Subcutaneous Q24H  . feeding supplement (ENSURE ENLIVE)  237 mL Oral BID BM  . metoprolol succinate  25 mg  Oral Daily  . mupirocin ointment  1 application Topical BID   Continuous Infusions: . methocarbamol (ROBAXIN) IV    . sodium chloride       LOS: 6 days   Time spent: 71min  Shaeley Segall C Briea Mcenery, DO [facility]  If 7PM-7AM, please contact night-coverage [email or web address] Password [facility] 08/16/2019, 11:23 AM

## 2019-08-16 NOTE — Care Management Important Message (Signed)
Important Message  Patient Details  Name: Vanessa Knox MRN: [id number] Date of Birth: Aug 22, 1916   Medicare Important Message Given:  Yes     Memory Argue 08/16/2019, 4:20 PM

## 2019-08-16 NOTE — NC FL2 (Signed)
[place] LEVEL OF CARE SCREENING TOOL     IDENTIFICATION  Patient Name: Vanessa Knox Birthdate: 06/16/1916 Sex: female Admission Date (Current Location): 08/10/2019  [facility] [place] Number:  Herbalist and Address:         Provider Number: [id number]  Attending Physician Name and Address:  Little Ishikawa, MD  Relative Name and Phone Number:  [name]  [phone]    Current Level of Care: Hospital Recommended Level of Care: [place] Prior Approval Number:    Date Approved/Denied:   PASRR Number: [id number]  Discharge Plan: SNF    Current Diagnoses: Patient Active Problem List   Diagnosis Date Noted  . Knee laceration, right, initial encounter 08/16/2019  . Supracondylar fracture of right femur, closed, initial encounter ([place]) 08/10/2019  . Multiple closed pelvic fractures without disruption of pelvic circle ([place]) 11/20/2018  . Comminuted fracture of hip, right, closed, initial encounter ([place]) 11/20/2018  . TIA (transient ischemic attack) 07/14/2017  . HTN (hypertension) 07/14/2017  . Compression fracture of L5 lumbar vertebra, sequela 01/02/2017  . Spinal stenosis of lumbar region with neurogenic claudication 01/02/2017  . Chronic low back pain with left-sided sciatica 01/02/2017    Orientation RESPIRATION BLADDER Height & Weight     Self, Time, Situation, Place  Normal Continent Weight: 63.5 kg Height:  5' 4.02" (162.6 cm)  BEHAVIORAL SYMPTOMS/MOOD NEUROLOGICAL BOWEL NUTRITION STATUS      Continent Diet  AMBULATORY STATUS COMMUNICATION OF NEEDS Skin   Extensive Assist Verbally Surgical wounds(staples open to air to head)                       Personal Care Assistance Level of Assistance  Bathing, Dressing Bathing Assistance: Maximum assistance   Dressing Assistance: Maximum assistance     Functional Limitations Info  Hearing   Hearing Info: Adequate      SPECIAL CARE FACTORS FREQUENCY  PT  (By licensed PT), OT (By licensed OT)     PT Frequency: 5 times a week OT Frequency: 5 times a week            Contractures Contractures Info: Not present    Additional Factors Info  Code Status, Allergies Code Status Info: DNR Allergies Info: keflex           Current Medications (08/16/2019):  This is the current hospital active medication list Current Facility-Administered Medications  Medication Dose Route Frequency Provider Last Rate Last Dose  . acetaminophen (TYLENOL) tablet 650 mg  650 mg Oral Q6H PRN Delray Alt, PA-C   650 mg at 08/14/19 1809  . bisacodyl (DULCOLAX) EC tablet 5 mg  5 mg Oral Daily PRN Delray Alt, PA-C      . cholecalciferol (VITAMIN D3) tablet 2,000 Units  2,000 Units Oral Daily Delray Alt, PA-C   2,000 Units at 08/16/19 [id number]  . docusate sodium (COLACE) capsule 100 mg  100 mg Oral BID Patrecia Pace A, PA-C   100 mg at 08/16/19 0901  . enoxaparin (LOVENOX) injection 30 mg  30 mg Subcutaneous Q24H Lyndee Leo, RPH   30 mg at 08/16/19 [id number]  . feeding supplement (ENSURE ENLIVE) (ENSURE ENLIVE) liquid 237 mL  237 mL Oral BID BM Little Ishikawa, MD   237 mL at 08/16/19 1632  . HYDROcodone-acetaminophen (NORCO/VICODIN) 5-325 MG per tablet 1 tablet  1 tablet Oral Q6H PRN Little Ishikawa, MD   1 tablet at 08/16/19  [id number]  . methocarbamol (ROBAXIN) tablet 500 mg  500 mg Oral Q6H PRN Delray Alt, PA-C   500 mg at 08/15/19 1324   Or  . methocarbamol (ROBAXIN) 500 mg in dextrose 5 % 50 mL IVPB  500 mg Intravenous Q6H PRN Delray Alt, PA-C      . metoprolol succinate (TOPROL-XL) 24 hr tablet 25 mg  25 mg Oral Daily Patrecia Pace A, PA-C   25 mg at 08/16/19 0857  . mupirocin ointment (BACTROBAN) 2 % 1 application  1 application Topical BID Delray Alt, PA-C   1 application at [id number]  . ondansetron (ZOFRAN) injection 4 mg  4 mg Intravenous Q6H PRN Patrecia Pace A, PA-C      . polyethylene glycol (MIRALAX / GLYCOLAX) packet 17  g  17 g Oral Daily PRN Patrecia Pace A, PA-C      . sodium chloride 0.9 % bolus 1,000 mL  1,000 mL Intravenous Once Delray Alt, PA-C         Discharge Medications: Please see discharge summary for a list of discharge medications.  Relevant Imaging Results:  Relevant Lab Results:   Additional Information SSI [id number]  Lowen Mansouri, Edson Snowball, RN

## 2019-08-16 NOTE — Plan of Care (Signed)
  Problem: Education: Goal: Knowledge of General Education information will improve Description Including pain rating scale, medication(s)/side effects and non-pharmacologic comfort measures Outcome: Progressing   Problem: Clinical Measurements: Goal: Ability to maintain clinical measurements within normal limits will improve Outcome: Progressing   Problem: Clinical Measurements: Goal: Will remain free from infection Outcome: Progressing   Problem: Clinical Measurements: Goal: Cardiovascular complication will be avoided Outcome: Progressing   

## 2019-08-16 NOTE — Progress Notes (Signed)
Ortho Trauma Progress Note  Doing well. Pain controlled. Wants to go home with [facility] PT  RLE: Incisions/skin tear stable and appears well. Neurovascularly intact  A/P 85 yo female s/p ORIF of right periprosthetic femur fracture  WBAT RLE Dressing change PRN, would leave on for another 48 hours then can remove to leave open to air PT/OT Possible discharge with [facility] PT Follow up in office in 2 weeks for suture removal and x-rays  Shona Needles, MD [facility] [phone] (phone) [phone] (office) [email or web address]

## 2019-08-16 NOTE — TOC Initial Note (Signed)
Transition of Care [facility]) - Initial/Assessment Note    Patient Details  Name: Vanessa Knox MRN: [id number] Date of Birth: 03/16/1916  [facility]) CM/SW Contact:    Vanessa Favre, RN Phone Number: 08/16/2019, 1:01 PM  Clinical Narrative:                 Patient from [place] living, requiring 2 person assist. [facility] not able to provide 24 hour assistance. Patient voiced understanding and is agreeable to SNF.  Patient consented for NCM to call her son Vanessa Knox [phone]. Vanessa Knox agrees with SNF his first choice is [facility].   Will follow up with bed offers   Expected Discharge Plan: [place] Barriers to Discharge: Ship broker, Continued Medical Work up   Patient Goals and CMS Choice Patient states their goals for this hospitalization and ongoing recovery are:: to get stronger CMS Medicare.gov Compare Post Acute Care list provided to:: Patient Choice offered to / list presented to : Patient, Adult Children  Expected Discharge Plan and Services Expected Discharge Plan: [place] In-house Referral: Clinical Social Work   Post Acute Care Choice: [place] Living arrangements for the past 2 months: Apartment                 DME Arranged: N/A         HH Arranged: NA          Prior Living Arrangements/Services Living arrangements for the past 2 months: Apartment Lives with:: Self Patient language and need for interpreter reviewed:: Yes Do you feel safe going back to the place where you live?: Yes      Need for Family Participation in Patient Care: Yes (Comment) Care giver support system in place?: No (comment) Current home services: DME Criminal Activity/Legal Involvement Pertinent to Current Situation/Hospitalization: No - Comment as needed  Activities of Daily Living Home Assistive Devices/Equipment: Walker (specify type) ADL Screening (condition at time of  admission) Patient's cognitive ability adequate to safely complete daily activities?: Yes Is the patient deaf or have difficulty hearing?: Yes Does the patient have difficulty seeing, even when wearing glasses/contacts?: Yes Does the patient have difficulty concentrating, remembering, or making decisions?: Yes Patient able to express need for assistance with ADLs?: Yes Does the patient have difficulty dressing or bathing?: Yes Independently performs ADLs?: No Communication: Needs assistance Does the patient have difficulty walking or climbing stairs?: Yes Weakness of Legs: Both Weakness of Arms/Hands: Both  Permission Sought/Granted Permission sought to share information with : Case Manager Permission granted to share information with : Yes, Verbal Permission Granted  Share Information with NAME: Vanessa Knox [id number] son     Permission granted to share info w Relationship: son  Permission granted to share info w Contact Information: [phone]  Emotional Assessment Appearance:: Appears stated age Attitude/Demeanor/Rapport: Engaged Affect (typically observed): Accepting Orientation: : Oriented to Self, Oriented to Place, Oriented to  Time, Oriented to Situation Alcohol / Substance Use: Not Applicable Psych Involvement: No (comment)  Admission diagnosis:  Fall, initial encounter [W19.XXXA] Laceration of scalp, initial encounter [S01.01XA] Type I or II open nondisplaced supracondylar fracture of right femur without intracondylar extension, initial encounter [facility]) [S72.454B] Patient Active Problem List   Diagnosis Date Noted  . Supracondylar fracture of femur, right, open type I or II, initial encounter ([place]) 08/10/2019  . Multiple closed pelvic fractures without disruption of pelvic circle ([place]) 11/20/2018  . Comminuted fracture of hip, right, closed, initial encounter (  [place]) 11/20/2018  . TIA (transient ischemic attack) 07/14/2017  . HTN (hypertension) 07/14/2017  .  Compression fracture of L5 lumbar vertebra, sequela 01/02/2017  . Spinal stenosis of lumbar region with neurogenic claudication 01/02/2017  . Chronic low back pain with left-sided sciatica 01/02/2017   PCP:  Vanessa Battles, MD Pharmacy:   [id number] - , [place]. AT [address] [place] [address] Phone: [phone] Fax: [phone]     Social Determinants of Health (SDOH) Interventions    Readmission Risk Interventions No flowsheet data found.

## 2019-08-16 NOTE — Progress Notes (Signed)
Nurse tech noted patient to have multiple warts to labia.  Some are red and some have a white thick fluid in them.  Leaving off purwick,that  patient uses at night.  Patient states that she isn't itching or hurting to that area.

## 2019-08-17 DIAGNOSIS — S72451A Displaced supracondylar fracture without intracondylar extension of lower end of right femur, initial encounter for closed fracture: Secondary | ICD-10-CM

## 2019-08-17 NOTE — Progress Notes (Signed)
Report given to Sawyer @1808 . Stated we are just waiting for transport and then she will be headed on her way over.   Jenene Slicker, RN 08/17/2019 [id number]

## 2019-08-17 NOTE — Progress Notes (Signed)
Physical Therapy Treatment Patient Details Name: Vanessa Knox MRN: [id number] DOB: October 06, 1916 Today's Date: 08/17/2019    History of Present Illness Pt is a 85 y.o. female admitted from independent living facility on 08/10/19 after fall; pt sustained forehead lacerations and R distal femur fx s/p ORIF 8/26. PMH includes HTN, sciatic, R rotator cuff repair; previous R femur IM nail (11/2018).    PT Comments    Pt progressing with functional mobility and able to tolerate increased ambulation distance during gait training this session, min assist for ambulation and mod assist to boost up to standing with RW. Pt continues to be limited by pain and weakness and would benefit from SNF level therapies in order to maximize independence with functional mobility.   Follow Up Recommendations  SNF;Supervision/Assistance - 24 hour     Equipment Recommendations  None recommended by PT    Recommendations for Other Services       Precautions / Restrictions Precautions Precautions: Fall Restrictions Weight Bearing Restrictions: Yes RLE Weight Bearing: Weight bearing as tolerated    Mobility  Bed Mobility                  Transfers Overall transfer level: Needs assistance Equipment used: Rolling walker (2 wheeled) Transfers: Sit to/from Stand Sit to Stand: Mod assist         General transfer comment: Pt performed x 2 sit<>stands throughout this session, requiring mod assist for boost up to standing with RW, cues for hand placement  Ambulation/Gait Ambulation/Gait assistance: Min assist Gait Distance (Feet): 8 Feet Assistive device: Rolling walker (2 wheeled) Gait Pattern/deviations: Step-to pattern;Leaning posteriorly;Trunk flexed;Decreased weight shift to right;Antalgic Gait velocity: decreased Gait velocity interpretation: <1.31 ft/sec, indicative of household ambulator General Gait Details: Pt ambulated x 6 ft and x 8 ft this session with RW and min assist, cues for upright  posture and increased step length, distance limited secondary to pain and weakness   Stairs             Wheelchair Mobility    Modified Rankin (Stroke Patients Only)       Balance           Standing balance support: Bilateral upper extremity supported;During functional activity Standing balance-Leahy Scale: Poor Standing balance comment: Reliant on UE support and external assist for safety and balance during ambulation                            Cognition Arousal/Alertness: Awake/alert Behavior During Therapy: WFL for tasks assessed/performed;Anxious Overall Cognitive Status: No family/caregiver present to determine baseline cognitive functioning                     Current Attention Level: Sustained Memory: Decreased short-term memory Following Commands: Follows multi-step commands consistently       General Comments: WFL for simple mobility tasks       Exercises General Exercises - Lower Extremity Ankle Circles/Pumps: AROM;Both;Seated Long Arc Quad: AROM;Both;Seated Hip ABduction/ADduction: AROM;Both;Seated Hip Flexion/Marching: AROM;Both;Seated    General Comments        Pertinent Vitals/Pain Pain Assessment: Faces Faces Pain Scale: Hurts even more Pain Location: R LE Pain Descriptors / Indicators: Guarding;Grimacing Pain Intervention(s): Limited activity within patient's tolerance;Repositioned;Monitored during session;RN gave pain meds during session    Home Living                      Prior Function  PT Goals (current goals can now be found in the care plan section) Progress towards PT goals: Progressing toward goals    Frequency    Min 3X/week      PT Plan Frequency needs to be updated    Co-evaluation              AM-PAC PT "6 Clicks" Mobility   Outcome Measure  Help needed turning from your back to your side while in a flat bed without using bedrails?: A Little Help needed moving  from lying on your back to sitting on the side of a flat bed without using bedrails?: A Lot Help needed moving to and from a bed to a chair (including a wheelchair)?: A Little Help needed standing up from a chair using your arms (e.g., wheelchair or bedside chair)?: A Lot Help needed to walk in hospital room?: A Little Help needed climbing 3-5 steps with a railing? : Total 6 Click Score: 14    End of Session Equipment Utilized During Treatment: Gait belt Activity Tolerance: Patient tolerated treatment well Patient left: in chair;with call bell/phone within reach;with chair alarm set Nurse Communication: Mobility status PT Visit Diagnosis: Other abnormalities of gait and mobility (R26.89);Pain;Muscle weakness (generalized) (M62.81) Pain - Right/Left: Right Pain - part of body: Leg     Time: [id number] PT Time Calculation (min) (ACUTE ONLY): 23 min  Charges:  $Gait Training: 8-22 mins $Therapeutic Exercise: 8-22 mins                     Netta Corrigan, PT, DPT Acute Rehab Office [place] 08/17/2019, 12:47 PM

## 2019-08-17 NOTE — Progress Notes (Signed)
Nutrition Follow-up  DOCUMENTATION CODES:   Not applicable  INTERVENTION:  Continue Ensure Enlive po BID, each supplement provides 350 kcal and 20 grams of protein  Encourage adequate PO intake.   NUTRITION DIAGNOSIS:   Increased nutrient needs related to post-op healing as evidenced by estimated needs; ongoing  GOAL:   Patient will meet greater than or equal to 90% of their needs; met  MONITOR:   PO intake, Supplement acceptance, Skin, Weight trends, Labs, I & O's  REASON FOR ASSESSMENT:   Consult Hip fracture protocol  ASSESSMENT:   85 y.o. female with medical history significant of HTN and spinal stenosis presenting with a mechanical fall. X-rays showed a periprosthetic distal femur fracture she also had lacerations about her knee as well as her scalp and forehead  Procedure (8/26): OPEN REDUCTION INTERNAL FIXATION (ORIF) SUPRACONDYLAR FRACTURE (Right Leg Lower) IRRIGATION AND DEBRIDEMENT PATELLA AND OPEN FEMUR FRACTURE (Right Leg Lower)    Meal completion has been 50-100%. Pt currently has Ensure ordered and has been consuming them. Plans for discharge to SNF. Recommend continuation of supplementation post discharge to aid in caloric and protein needs especially if po intake is poor. Labs and medications reviewed.   Diet Order:   Diet Order            Diet - low sodium heart healthy        Diet Heart Room service appropriate? Yes with Assist; Fluid consistency: Thin  Diet effective now              EDUCATION NEEDS:   Not appropriate for education at this time  Skin:  Skin Assessment: Skin Integrity Issues: Skin Integrity Issues:: Incisions Incisions: R leg  Last BM:  8/31  Height:   Ht Readings from Last 1 Encounters:  08/11/19 5' 4.02" (1.626 m)    Weight:   Wt Readings from Last 1 Encounters:  08/11/19 63.5 kg    Ideal Body Weight:  54.5 kg  BMI:  Body mass index is 24.02 kg/m.  Estimated Nutritional Needs:   Kcal:   1500-1650  Protein:  60-70 grams  Fluid:  >/= 1.5 L/day    Corrin Parker, MS, RD, LDN Pager # [phone] After hours/ weekend pager # [phone]

## 2019-08-17 NOTE — Progress Notes (Signed)
Called [facility] to give report on pt. Secretary stated their nurses are not responding to their page at the moment so they will give [place] a call back. I told them to call [phone] when they are ready.   Jenene Slicker, RN 08/17/2019 [phone]

## 2019-08-17 NOTE — Discharge Summary (Signed)
Physician Discharge Summary  Vanessa Knox ZQ:[id number] DOB: 1916-05-25 DOA: 08/10/2019  PCP: Leanna Battles, MD  Admit date: 08/10/2019 Discharge date: 08/17/2019  Admitted From: ALF Disposition:  SNF  Recommendations for Outpatient Follow-up:  1. Follow up with PCP in 1-2 weeks 2. Please obtain BMP/CBC in one week  Discharge Condition:Stable  CODE STATUS: DNR  Diet recommendation: As tolerated   Brief/Interim Summary: Vanessa Knox is a 85 y.o. female with medical history significant of HTN and spinal stenosis presenting with a mechanical fall.  She also fell in Dec, resulting in a R intertrochanteric hip fracture.  She was discharged to [facility] for rehab and returned back home again.  Today, she got up to use the bathroom and slipped, causing her walker to pull her down.  She had a head laceration as well as R knee injury.  No LOC.  Felt well prior to the fall.  In ED patient was found to have supracondylar femur fracture. Ortho (Dr. Percell Miller) planning for OR this morning.  Patient tolerated ORIF on 08/11/19 with orthopedic surgery - who also closed her R knee laceration. Patient head laceration repaired previously in ED. Patient initially requesting discharge back to ALF which seemed reasonable however given ongoing weakness and instability with PT it was deemed SNF was a more reasonable, and safer, choice - both patient and son agreeable for SNF. At this time patient has been accepted to SNF for ongoing rehab evaluation and physical therapy. Patient to follow with ortho for post op evaluation and suture/staple removal per their schedule. Asa 325 daily per ortho for DVT ppx. Patient did have acute on chronic anemia - likely partially post-op vs exacerbation of chronic anemia due to poor PO intake - resolved after transfusion.  Discharge Diagnoses:  Principal Problem:   Supracondylar fracture of right femur, closed, initial encounter ([place]) Active Problems:   HTN (hypertension)   Knee  laceration, right, initial encounter   Discharge Instructions  Discharge Instructions    Call MD for:  extreme fatigue   Complete by: As directed    Call MD for:  persistant dizziness or light-headedness   Complete by: As directed    Call MD for:  persistant nausea and vomiting   Complete by: As directed    Call MD for:  redness, tenderness, or signs of infection (pain, swelling, redness, odor or green/yellow discharge around incision site)   Complete by: As directed    Call MD for:  severe uncontrolled pain   Complete by: As directed    Call MD for:  temperature >100.4   Complete by: As directed    Diet - low sodium heart healthy   Complete by: As directed    Increase activity slowly   Complete by: As directed      Allergies as of 08/17/2019      Reactions   Keflex [cephalexin] Diarrhea, Other (See Comments)   Headaches and dizziness also      Medication List    STOP taking these medications   enoxaparin 30 MG/0.3ML injection Commonly known as: Lovenox     TAKE these medications   aspirin 325 MG tablet Take 325 mg by mouth daily.   HYDROcodone-acetaminophen 5-325 MG tablet Commonly known as: NORCO/VICODIN Take 1-2 tablets by mouth every 6 (six) hours as needed for moderate pain or severe pain (1 tablet moderate pain, 2 tablets severe pain).   metoprolol succinate 25 MG 24 hr tablet Commonly known as: TOPROL-XL Take 1 tablet (25 mg total) by  mouth daily.   Vitamin D3 25 MCG tablet Commonly known as: Vitamin D Take 2 tablets (2,000 Units total) by mouth daily.      Follow-up Information    Haddix, Thomasene Lot, MD. Schedule an appointment as soon as possible for a visit in 2 week(s).   Specialty: Orthopedic Surgery Why: suture removal, repeat x-rays Contact information: [place] [address] [phone]          Allergies  Allergen Reactions  . Keflex [Cephalexin] Diarrhea and Other (See Comments)    Headaches and dizziness also      Consultations:  Orthopedic Sx   Procedures/Studies: Ct Head Wo Contrast  Result Date: 08/10/2019 CLINICAL DATA:  Fall with head trauma EXAM: CT HEAD WITHOUT CONTRAST CT CERVICAL SPINE WITHOUT CONTRAST TECHNIQUE: Multidetector CT imaging of the head and cervical spine was performed following the standard protocol without intravenous contrast. Multiplanar CT image reconstructions of the cervical spine were also generated. COMPARISON:  Brain MRI 07/15/2017 FINDINGS: CT HEAD FINDINGS Brain: No evidence of acute infarction, hemorrhage, hydrocephalus, extra-axial collection or mass lesion/mass effect. Age congruent volume loss and white matter appearance Vascular: No hyperdense vessel or unexpected calcification. Skull: High right scalp laceration without calvarial fracture. Sinuses/Orbits: No evidence of injury. Bilateral cataract resection. CT CERVICAL SPINE FINDINGS Alignment: No traumatic malalignment. Degenerative appearing C3-4, C4-5, and C7-T1 anterolisthesis. Skull base and vertebrae: Negative for acute fracture. Soft tissues and spinal canal: No prevertebral fluid or swelling. No visible canal hematoma. Bilateral parotid calculi. Disc levels: Disc degeneration focally advanced at C5-6 and C6-7. Generalized degenerative facet spurring. Upper chest: Negative IMPRESSION: 1. No evidence of acute intracranial injury or cervical spine fracture. 2. Rights scalp laceration without calvarial fracture. Electronically Signed   By: Monte Fantasia M.D.   On: 08/10/2019 05:48   Ct Cervical Spine Wo Contrast  Result Date: 08/10/2019 CLINICAL DATA:  Fall with head trauma EXAM: CT HEAD WITHOUT CONTRAST CT CERVICAL SPINE WITHOUT CONTRAST TECHNIQUE: Multidetector CT imaging of the head and cervical spine was performed following the standard protocol without intravenous contrast. Multiplanar CT image reconstructions of the cervical spine were also generated. COMPARISON:  Brain MRI 07/15/2017 FINDINGS: CT HEAD  FINDINGS Brain: No evidence of acute infarction, hemorrhage, hydrocephalus, extra-axial collection or mass lesion/mass effect. Age congruent volume loss and white matter appearance Vascular: No hyperdense vessel or unexpected calcification. Skull: High right scalp laceration without calvarial fracture. Sinuses/Orbits: No evidence of injury. Bilateral cataract resection. CT CERVICAL SPINE FINDINGS Alignment: No traumatic malalignment. Degenerative appearing C3-4, C4-5, and C7-T1 anterolisthesis. Skull base and vertebrae: Negative for acute fracture. Soft tissues and spinal canal: No prevertebral fluid or swelling. No visible canal hematoma. Bilateral parotid calculi. Disc levels: Disc degeneration focally advanced at C5-6 and C6-7. Generalized degenerative facet spurring. Upper chest: Negative IMPRESSION: 1. No evidence of acute intracranial injury or cervical spine fracture. 2. Rights scalp laceration without calvarial fracture. Electronically Signed   By: Monte Fantasia M.D.   On: 08/10/2019 05:48   Ct Knee Right Wo Contrast  Result Date: 08/10/2019 CLINICAL DATA:  The trauma with tibial plateau fracture. EXAM: CT OF THE right KNEE WITHOUT CONTRAST TECHNIQUE: Multidetector CT imaging of the right knee was performed according to the standard protocol. Multiplanar CT image reconstructions were also generated. COMPARISON:  None. FINDINGS: Oblique fracture through the supracondylar femur, lateral and posterior eccentric, interposed between a femoral nail and the femoral component of a total knee arthroplasty. In addition to the dominant oblique fracture plane there  is a small cortical fracture extending cranially through the posterior cortex of the distal femur, seem markings on coronal reformats. No displacement. No additional fracture noted. The distal interlocking screw of the femoral nail is fractured, likely on a chronic basis. Regional soft tissue swelling with small joint effusion. Prominent osteopenia.  IMPRESSION: Oblique supracondylar femur fracture interposed between a femoral nail and total knee arthroplasty. Electronically Signed   By: Monte Fantasia M.D.   On: 08/10/2019 05:54   Dg Pelvis Portable  Result Date: 08/10/2019 CLINICAL DATA:  Fall EXAM: PORTABLE PELVIS 1-2 VIEWS COMPARISON:  None. FINDINGS: Remote intertrochanteric right femur fracture with healing after fixation. Remote left obturator ring and right inferior pubic ramus fractures. No evidence of acute fracture. Osteopenia is profound. Lower lumbar degenerative disease with probable L5 vertebroplasty. IMPRESSION: 1. No acute finding. 2. Remote pelvic and right proximal femoral fractures. Electronically Signed   By: Monte Fantasia M.D.   On: 08/10/2019 06:01   Dg Chest Portable 1 View  Result Date: 08/10/2019 CLINICAL DATA:  Fall EXAM: PORTABLE CHEST 1 VIEW COMPARISON:  11/20/2018 FINDINGS: Normal heart size and stable mediastinal contours. Chronic mild interstitial coarsening. There is no edema, consolidation, effusion, or pneumothorax. Postoperative right axilla. Prominent osteopenia and bilateral shoulder osteoarthritis. No evidence of acute fracture. IMPRESSION: Stable from prior.  No acute finding. Electronically Signed   By: Monte Fantasia M.D.   On: 08/10/2019 06:00   Dg Knee Right Port  Result Date: 08/10/2019 CLINICAL DATA:  History of fall. EXAM: PORTABLE RIGHT KNEE - 1-2 VIEW COMPARISON:  None. FINDINGS: Two views of the right knee were obtained. There is a total knee arthroplasty and a femoral intramedullary nail that extends into the distal femur with an interlocking screw. There is a posterior displaced fracture involving the distal femur. The fracture appears to be at or just below the distal aspect of the intramedullary nail. The fracture is at least mildly comminuted. Knee arthroplasty is located. Interlocking screw in the intramedullary nail appears to be fractured. There is callus formation involving the distal  femur compatible with an old injury. Probable joint effusion with lipohemarthrosis. IMPRESSION: Displaced fracture involving the distal femur. Fracture appears to be situated between the intramedullary nail and the femoral prosthesis of the knee arthroplasty. Knee arthroplasty is located. Electronically Signed   By: Markus Daft M.D.   On: 08/10/2019 08:39   Dg C-arm 1-60 Min  Result Date: 08/11/2019 CLINICAL DATA:  ORIF right distal femur EXAM: RIGHT FEMUR 2 VIEWS; DG C-ARM 1-60 MIN COMPARISON:  Radiographs August 10, 2019 FINDINGS: Intraoperative fluoroscopy redemonstrates the right femoral intramedullary rod and transcervical cancellous screw which transfixes the remote proximal femoral fractures. There is a fracture of 1 of the distal fixation screws of this intramedullary rod. Interval addition of a lateral sideplate and screw fixation construct which transfixes the distal femoral periprosthetic fracture seen on radiographs from 1 day prior. Hardware is intact and engaged. Alignment is near anatomic. Expected postsurgical soft tissue changes are noted. No unexpected radiopaque foreign body. 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 IMPRESSION: Expected appearance post ORIF the distal right femur. Electronically Signed   By: Lovena Le M.D.   On: 08/11/2019 13:52   Dg Femur, Min 2 Views Right  Result Date: 08/11/2019 CLINICAL DATA:  ORIF right distal femur EXAM: RIGHT FEMUR 2 VIEWS; DG C-ARM 1-60 MIN COMPARISON:  Radiographs August 10, 2019 FINDINGS: Intraoperative fluoroscopy redemonstrates the right femoral intramedullary rod and transcervical cancellous screw  which transfixes the remote proximal femoral fractures. There is a fracture of 1 of the distal fixation screws of this intramedullary rod. Interval addition of a lateral sideplate and screw fixation construct which transfixes the distal femoral periprosthetic fracture seen on radiographs from 1 day prior.  Hardware is intact and engaged. Alignment is near anatomic. Expected postsurgical soft tissue changes are noted. No unexpected radiopaque foreign body. 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 IMPRESSION: Expected appearance post ORIF the distal right femur. Electronically Signed   By: Lovena Le M.D.   On: 08/11/2019 13:52   Dg Femur Port, Min 2 Views Right  Result Date: 08/11/2019 CLINICAL DATA:  85 year old female status post ORIF of right femoral fracture EXAM: RIGHT FEMUR PORTABLE 2 VIEW COMPARISON:  Preoperative radiographs dated 08/10/2019 FINDINGS: Interval ORIF of periprosthetic distal femoral fracture by application of a lateral buttress plate and screw construct. Of note, there is good apposition of the plate proximally and distally. However in the region of the prior fracture the plate is lateral to the femoral cortex by approximately 5 mm. The more cephalad of the 2 distal interlocking screws from the prior intramedullary nail is fractured. Surgical changes of prior intramedullary nail, transfemoral neck lag screw and total knee arthroplasty are evident. IMPRESSION: 1. Interval ORIF of periprosthetic distal femoral fracture with application of a lateral buttress plate and screw construct. Of note, while there is good apposition of the plate to the femoral cortex at the proximal and distal aspects of the plate, the segment in the mid and mid to distal plate does not contact the femoral cortex and is displaced laterally by approximately 5 mm. 2. Surgical changes of prior ORIF of a intertrochanteric femoral fracture. 3. Surgical changes of prior total knee arthroplasty. Electronically Signed   By: Jacqulynn Cadet M.D.   On: 08/11/2019 14:57    Subjective: No acute issues/events overnight   Discharge Exam: Vitals:   08/17/19 0744 08/17/19 1444  BP: 124/87 126/64  Pulse: 79 80  Resp: 16 17  Temp: 98.2 F (36.8 C) 98.4 F (36.9 C)  SpO2: 95% 95%    Vitals:   08/16/19 1937 08/17/19 0425 08/17/19 0744 08/17/19 1444  BP: 132/74 129/78 124/87 126/64  Pulse: 81 80 79 80  Resp: 15 16 16 17   Temp: 98.2 F (36.8 C) 98 F (36.7 C) 98.2 F (36.8 C) 98.4 F (36.9 C)  TempSrc: Oral Oral Oral Oral  SpO2: 95% 98% 95% 95%  Weight:      Height:       General:  Pleasantly resting in bed, No acute distress. HEENT: Scalp laceration clean dry and staples intact, no ongoing bleeding. Sclerae nonicteric, noninjected.  Extraocular movements intact bilaterally. Neck:  Without mass or deformity.  Trachea is midline. Lungs:  Clear to auscultate bilaterally without rhonchi, wheeze, or rales. Heart:  Regular rate and rhythm.  Without murmurs, rubs, or gallops. Abdomen:  Soft, nontender, nondistended.  Without guarding or rebound. Extremities: Multiple bandages clean dry intact.  Without cyanosis, clubbing, edema, or obvious deformity, sensation intact. Vascular:  Dorsalis pedis and posterior tibial pulses palpable bilaterally.  The results of significant diagnostics from this hospitalization (including imaging, microbiology, ancillary and laboratory) are listed below for reference.     Microbiology: Recent Results (from the past 240 hour(s))  SARS CORONAVIRUS 2 (TAT 6-12 HRS) Nasal Swab Aptima Multi Swab     Status: None   Collection Time: 08/10/19  6:08 AM   Specimen: Aptima  Multi Swab; Nasal Swab  Result Value Ref Range Status   SARS Coronavirus 2 NEGATIVE NEGATIVE Final    Comment: (NOTE) SARS-CoV-2 target nucleic acids are NOT DETECTED. The SARS-CoV-2 RNA is generally detectable in upper and lower respiratory specimens during the acute phase of infection. Negative results do not preclude SARS-CoV-2 infection, do not rule out co-infections with other pathogens, and should not be used as the sole basis for treatment or other patient management decisions. Negative results must be combined with clinical observations, patient history, and  epidemiological information. The expected result is Negative. Fact Sheet for Patients: SugarRoll.be Fact Sheet for Healthcare Providers: https://www.woods-mathews.com/ This test is not yet approved or cleared by the [place] FDA and  has been authorized for detection and/or diagnosis of SARS-CoV-2 by FDA under an Emergency Use Authorization (EUA). This EUA will remain  in effect (meaning this test can be used) for the duration of the COVID-19 declaration under Section 56 4(b)(1) of the Act, 21 U.S.C. section 360bbb-3(b)(1), unless the authorization is terminated or revoked sooner. Performed at [facility], [place] [address], [place], [place] [address]   MRSA PCR Screening     Status: Abnormal   Collection Time: 08/11/19  3:07 AM   Specimen: Nasal Mucosa; Nasopharyngeal  Result Value Ref Range Status   MRSA by PCR POSITIVE (A) NEGATIVE Final    Comment:        The GeneXpert MRSA Assay (FDA approved for NASAL specimens only), is one component of a comprehensive MRSA colonization surveillance program. It is not intended to diagnose MRSA infection nor to guide or monitor treatment for MRSA infections. ATAMBILE,J RN 08/11/2019 AT [id number] SKEEN,P Performed at [facility], [place] [address], [place], [place] [address]      Labs: BNP (last 3 results) No results for input(s): BNP in the last 8760 hours. Basic Metabolic Panel: Recent Labs  Lab 08/12/19 0429 08/13/19 0457 08/14/19 0338 08/15/19 0428 08/16/19 0502  NA 138 140 140 141 141  K 4.3 4.2 4.1 4.2 4.1  CL 102 105 104 106 103  CO2 28 26 28 28 28   GLUCOSE 113* 104* 115* 100* 118*  BUN 21 28* 30* 27* 29*  CREATININE 0.97 0.98 0.92 0.87 0.98  CALCIUM 8.0* 7.9* 8.1* 8.0* 8.2*   Liver Function Tests: No results for input(s): AST, ALT, ALKPHOS, BILITOT, PROT, ALBUMIN in the last 168 hours. No results for input(s): LIPASE, AMYLASE in the last 168 hours. No results for  input(s): AMMONIA in the last 168 hours. CBC: Recent Labs  Lab 08/12/19 0429 08/13/19 0457 08/14/19 0338 08/15/19 0428 08/15/19 2150 08/16/19 0502  WBC 9.3 7.2 7.4 6.3  --  7.4  HGB 7.7* 7.1* 7.0* 6.8* 11.1* 11.3*  HCT 24.8* 22.8* 22.7* 22.3* 33.4* 34.5*  MCV 92.5 95.0 95.0 94.9  --  89.8  PLT 198 193 215 244  --  263   Cardiac Enzymes: No results for input(s): CKTOTAL, CKMB, CKMBINDEX, TROPONINI in the last 168 hours. BNP: Invalid input(s): POCBNP CBG: No results for input(s): GLUCAP in the last 168 hours. D-Dimer No results for input(s): DDIMER in the last 72 hours. Hgb A1c No results for input(s): HGBA1C in the last 72 hours. Lipid Profile No results for input(s): CHOL, HDL, LDLCALC, TRIG, CHOLHDL, LDLDIRECT in the last 72 hours. Thyroid function studies No results for input(s): TSH, T4TOTAL, T3FREE, THYROIDAB in the last 72 hours.  Invalid input(s): FREET3 Anemia work up No results for input(s): VITAMINB12, FOLATE, FERRITIN,  TIBC, IRON, RETICCTPCT in the last 72 hours. Urinalysis No results found for: COLORURINE, APPEARANCEUR, [place], [place], [place], [place], [place], [place], PROTEINUR, UROBILINOGEN, NITRITE, LEUKOCYTESUR Sepsis Labs Invalid input(s): PROCALCITONIN,  WBC,  LACTICIDVEN Microbiology Recent Results (from the past 240 hour(s))  SARS CORONAVIRUS 2 (TAT 6-12 HRS) Nasal Swab Aptima Multi Swab     Status: None   Collection Time: 08/10/19  6:08 AM   Specimen: Aptima Multi Swab; Nasal Swab  Result Value Ref Range Status   SARS Coronavirus 2 NEGATIVE NEGATIVE Final    Comment: (NOTE) SARS-CoV-2 target nucleic acids are NOT DETECTED. The SARS-CoV-2 RNA is generally detectable in upper and lower respiratory specimens during the acute phase of infection. Negative results do not preclude SARS-CoV-2 infection, do not rule out co-infections with other pathogens, and should not be used as the sole basis for treatment or other patient management  decisions. Negative results must be combined with clinical observations, patient history, and epidemiological information. The expected result is Negative. Fact Sheet for Patients: SugarRoll.be Fact Sheet for Healthcare Providers: https://www.woods-mathews.com/ This test is not yet approved or cleared by the [place] FDA and  has been authorized for detection and/or diagnosis of SARS-CoV-2 by FDA under an Emergency Use Authorization (EUA). This EUA will remain  in effect (meaning this test can be used) for the duration of the COVID-19 declaration under Section 56 4(b)(1) of the Act, 21 U.S.C. section 360bbb-3(b)(1), unless the authorization is terminated or revoked sooner. Performed at [facility], [place] [address], [place], [place] [address]   MRSA PCR Screening     Status: Abnormal   Collection Time: 08/11/19  3:07 AM   Specimen: Nasal Mucosa; Nasopharyngeal  Result Value Ref Range Status   MRSA by PCR POSITIVE (A) NEGATIVE Final    Comment:        The GeneXpert MRSA Assay (FDA approved for NASAL specimens only), is one component of a comprehensive MRSA colonization surveillance program. It is not intended to diagnose MRSA infection nor to guide or monitor treatment for MRSA infections. ATAMBILE,J RN 08/11/2019 AT [id number] SKEEN,P Performed at [facility], [place] [address], [place], [place] [address]    Time coordinating discharge: Over 30 minutes  SIGNED:  Little Ishikawa, DO [facility] 08/17/2019, 3:32 PM Pager   If 7PM-7AM, please contact night-coverage [email or web address] Password [email or web address]

## 2019-08-17 NOTE — Progress Notes (Deleted)
Progress Note   Vanessa Knox [id number] DOB: August 16, 1916 DOA: 08/10/2019  PCP: Leanna Battles, MD Consultants:  Percell Miller - orthopedics Patient coming from: [place]; NOK: Melida Gimenez [place], [phone]  Chief Complaint: fall  HPI: Vanessa Knox is a 85 y.o. female with medical history significant of HTN and spinal stenosis presenting with a mechanical fall.  She also fell in Dec, resulting in a R intertrochanteric hip fracture.  She was discharged to [facility] for rehab and returned back home again.  Today, she got up to use the bathroom and slipped, causing her walker to pull her down.  She had a head laceration as well as R knee injury.  No LOC.  Felt well prior to the fall.  In ED patient was found to have supracondylar femur fracture. Ortho (Dr. Percell Miller) planning for OR this morning.   Subjective: No acute issues or events overnight, pain well controlled. Denies chest pain, shortness of breath, nausea, vomiting, diarrhea, constipation, headache, fevers, chills.  Assessment/Plan Principal Problem:   Supracondylar fracture of right femur, closed, initial encounter ([place]) Active Problems:   HTN (hypertension)   Knee laceration, right, initial encounter   Supracondylar open R femur fracture s/p fall, POA, status post ORIF -Mechanical fall s/p open reduction internal fixation of right distal femur fracture; Debridement and closure of right knee skin tear on 08/11/19 -Lovenox 30 daily per orthopedics -Pain control with Robxain, Vicodin, previously discontinued iv morphine -SW/PT/OT on going evaluation -patient now being evaluated for rehab, tentatively excepted on 08/18/2019 @[facility] per discussion with case management, likely stable once bed has been approved and insurance signed off  Acute on chronic anemia: likely blood loss anemia in the setting of chronic anemia of chronic disease  -Hemoglobin 11.3 s/p 2 unit PRBC 08/15/2019 - follow in 3-5 days after  discharge -Baseline hemoglobin appears to be around 8 per chart review; acute anemia in the setting of surgery and daily phlebotomy  HTN, essential, well-controlled -Continue Toprol XL  DVT prophylaxis: Lovenox Code Status:  DNR Family Communication: Son updated over the phone Disposition Plan: Tentative disposition to [facility] on 08/18/2019 Consults: Orthopedics Admission status: Inpatient - continues to require postoperative care, pain control, physical therapy, disposition pending safe discharge, likely SNF at [facility] as above on 08/18/2019  Ambulatory Status:  Ambulates with a walker prior to admission  Physical Exam: Vitals:   08/16/19 0811 08/16/19 1606 08/16/19 1937 08/17/19 0425  BP: (!) 149/65 126/78 132/74 129/78  Pulse: 80 86 81 80  Resp: 17 16 15 16   Temp: 98.2 F (36.8 C) 97.9 F (36.6 C) 98.2 F (36.8 C) 98 F (36.7 C)  TempSrc: Oral Oral Oral Oral  SpO2: 93% 98% 95% 98%  Weight:      Height:        General:  Pleasantly resting in bed, No acute distress. HEENT: Scalp laceration clean dry and staples intact, no ongoing bleeding. Normocephalic atraumatic.  Sclerae nonicteric, noninjected.  Extraocular movements intact bilaterally. Neck:  Without mass or deformity.  Trachea is midline. Lungs:  Clear to auscultate bilaterally without rhonchi, wheeze, or rales. Heart:  Regular rate and rhythm.  Without murmurs, rubs, or gallops. Abdomen:  Soft, nontender, nondistended.  Without guarding or rebound. Extremities: Multiple bandages clean dry intact.  Without cyanosis, clubbing, edema, or obvious deformity, sensation intact. Vascular:  Dorsalis pedis and posterior tibial pulses palpable bilaterally.  Data Reviewed: I have personally reviewed following labs and imaging studies  CBC: Recent Labs  Lab 08/10/19 0804  08/12/19 0429 08/13/19 0457 08/14/19 0338 08/15/19 0428 08/15/19 2150 08/16/19 0502  WBC 14.9*   < > 9.3 7.2 7.4 6.3  --  7.4  NEUTROABS  12.9*  --   --   --   --   --   --   --   HGB 10.3*   < > 7.7* 7.1* 7.0* 6.8* 11.1* 11.3*  HCT 33.4*   < > 24.8* 22.8* 22.7* 22.3* 33.4* 34.5*  MCV 95.4   < > 92.5 95.0 95.0 94.9  --  89.8  PLT 248   < > 198 193 215 244  --  263   < > = values in this interval not displayed.   Basic Metabolic Panel: Recent Labs  Lab 08/12/19 0429 08/13/19 0457 08/14/19 0338 08/15/19 0428 08/16/19 0502  NA 138 140 140 141 141  K 4.3 4.2 4.1 4.2 4.1  CL 102 105 104 106 103  CO2 28 26 28 28 28   GLUCOSE 113* 104* 115* 100* 118*  BUN 21 28* 30* 27* 29*  CREATININE 0.97 0.98 0.92 0.87 0.98  CALCIUM 8.0* 7.9* 8.1* 8.0* 8.2*   Liver Function Tests: Recent Labs  Lab 08/10/19 0804  AST 17  ALT 13  ALKPHOS 70  BILITOT 0.7  PROT 5.6*  ALBUMIN 3.3*   Coagulation Profile: Recent Labs  Lab 08/10/19 0804  INR 1.2   CBG: Recent Labs  Lab 08/10/19 0801  GLUCAP 134*   Recent Results (from the past 240 hour(s))  SARS CORONAVIRUS 2 (TAT 6-12 HRS) Nasal Swab Aptima Multi Swab     Status: None   Collection Time: 08/10/19  6:08 AM   Specimen: Aptima Multi Swab; Nasal Swab  Result Value Ref Range Status   SARS Coronavirus 2 NEGATIVE NEGATIVE Final    Comment: (NOTE) SARS-CoV-2 target nucleic acids are NOT DETECTED. The SARS-CoV-2 RNA is generally detectable in upper and lower respiratory specimens during the acute phase of infection. Negative results do not preclude SARS-CoV-2 infection, do not rule out co-infections with other pathogens, and should not be used as the sole basis for treatment or other patient management decisions. Negative results must be combined with clinical observations, patient history, and epidemiological information. The expected result is Negative. Fact Sheet for Patients: SugarRoll.be Fact Sheet for Healthcare Providers: https://www.woods-mathews.com/ This test is not yet approved or cleared by the [place] FDA and  has  been authorized for detection and/or diagnosis of SARS-CoV-2 by FDA under an Emergency Use Authorization (EUA). This EUA will remain  in effect (meaning this test can be used) for the duration of the COVID-19 declaration under Section 56 4(b)(1) of the Act, 21 U.S.C. section 360bbb-3(b)(1), unless the authorization is terminated or revoked sooner. Performed at [facility], [place] [address] [place], [place] [address]   MRSA PCR Screening     Status: Abnormal   Collection Time: 08/11/19  3:07 AM   Specimen: Nasal Mucosa; Nasopharyngeal  Result Value Ref Range Status   MRSA by PCR POSITIVE (A) NEGATIVE Final    Comment:        The GeneXpert MRSA Assay (FDA approved for NASAL specimens only), is one component of a comprehensive MRSA colonization surveillance program. It is not intended to diagnose MRSA infection nor to guide or monitor treatment for MRSA infections. ATAMBILE,J RN 08/11/2019 AT [id number] SKEEN,P Performed at [facility], [place] [address], [place],  [address]      Radiology Studies: No results found.  Scheduled Meds: .  cholecalciferol  2,000 Units Oral Daily  . docusate sodium  100 mg Oral BID  . enoxaparin (LOVENOX) injection  30 mg Subcutaneous Q24H  . feeding supplement (ENSURE ENLIVE)  237 mL Oral BID BM  . metoprolol succinate  25 mg Oral Daily  . mupirocin ointment  1 application Topical BID   Continuous Infusions: . methocarbamol (ROBAXIN) IV    . sodium chloride       LOS: 7 days   Time spent: 85min  Galya Dunnigan C Dayshawn Irizarry, DO [facility]  If 7PM-7AM, please contact night-coverage [email or web address] Password [facility] 08/17/2019, 7:27 AM

## 2019-08-17 NOTE — TOC Progression Note (Addendum)
Transition of Care [facility]) - Progression Note    Patient Details  Name: Vanessa Knox MRN: [id number] Date of Birth: [birth year]  Transition [facility] CM/SW Contact  Jacalyn Lefevre Edson Snowball, RN Phone Number: 08/17/2019, 10:07 AM  Clinical Narrative:   PTAR arranged for 6 pm.  Narda Rutherford at [facility] has received insurance authorization. Sharyn Lull has done paperwork . Sharyn Lull is on her way to hospital to visit before discharge. Once DNR form is signed will call PTAR.   Nurse to call report to [phone] patient will be going to room 3217.   Update Zenia Resides has accepted bed at [facility] , Narda Rutherford is starting authorization. Narda Rutherford does require covid test within 48 hours as long as patient has one negative test and no fever , no s/s. Allen's sign. Other is Stevan Born and can be contacted for any further decisions.  Janie with [facility] did make a bed offer. Patient aware stated" my son [name] will make the decision". Scherrie November . [facility] is his first choice, however, he would like to speak to admissions at [facility] directly before accepting. Left Janie a message awaiting call back.  Patient will need another covid test and insurance authorization.  Expected Discharge Plan: Skilled Nursing Facility Barriers to Discharge: Ship broker, Continued Medical Work up  Expected Discharge Plan and Services Expected Discharge Plan: [place] In-house Referral: Clinical Social Work   Post Acute Care Choice: [place] Living arrangements for the past 2 months: Apartment                 DME Arranged: N/A         HH Arranged: NA           Social Determinants of Health (SDOH) Interventions    Readmission Risk Interventions No flowsheet data found.

## 2019-11-08 IMAGING — RF DG FEMUR 2+V*R*
1 series · 4 of 4 positions shown · non-contrast
Comparison: 11/20/2018

CLINICAL DATA: Intramedullary nail

EXAM:
DG C-ARM 61-120 MIN; RIGHT FEMUR 2 VIEWS

[Series 1: run · 4 of 4 slices shown]
[im 1/4]
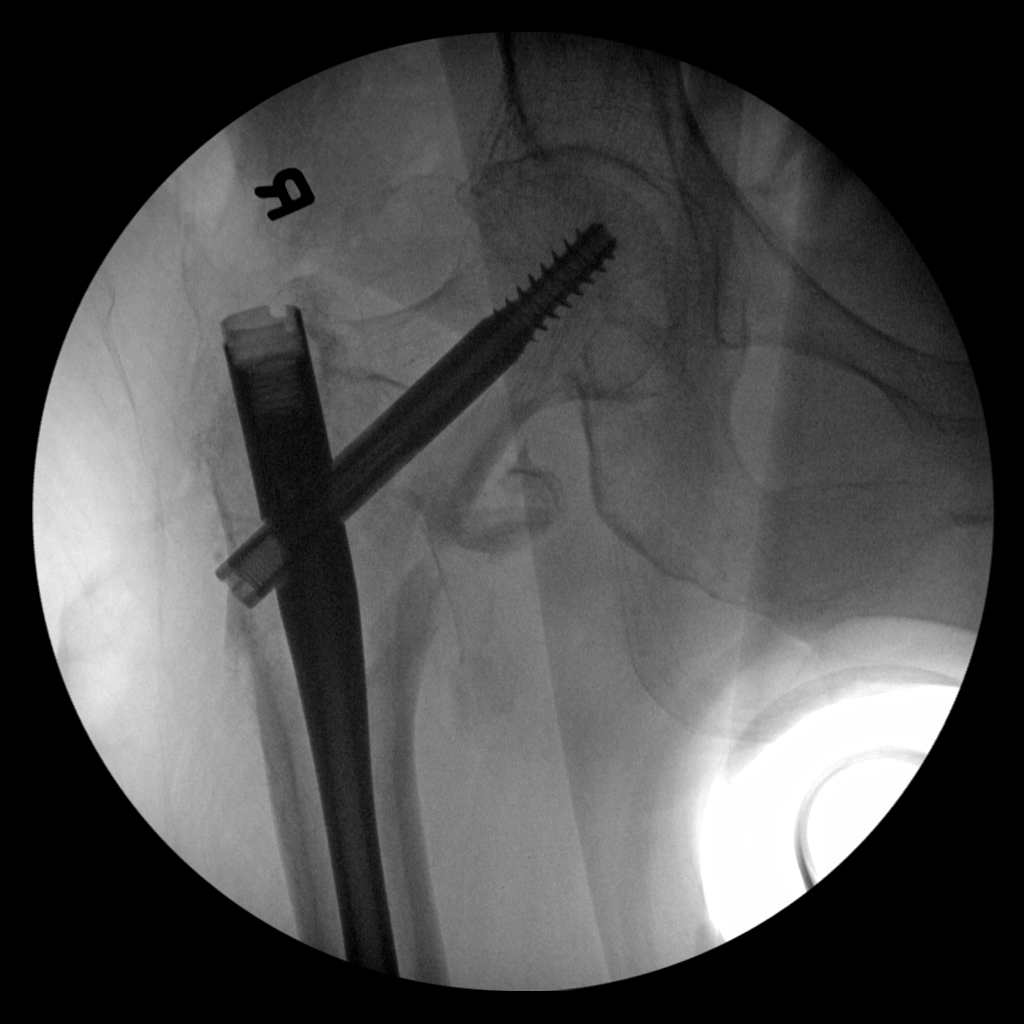
[im 2/4]
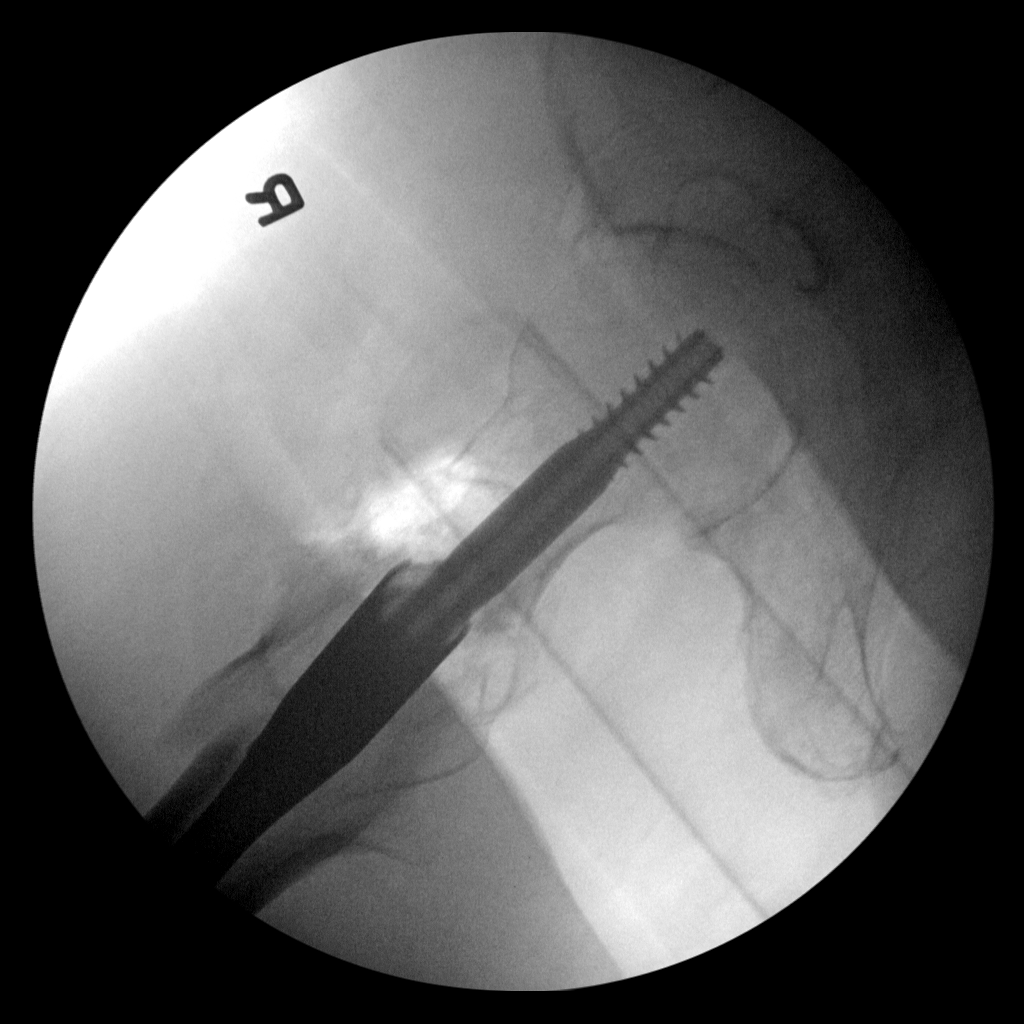
[im 3/4]
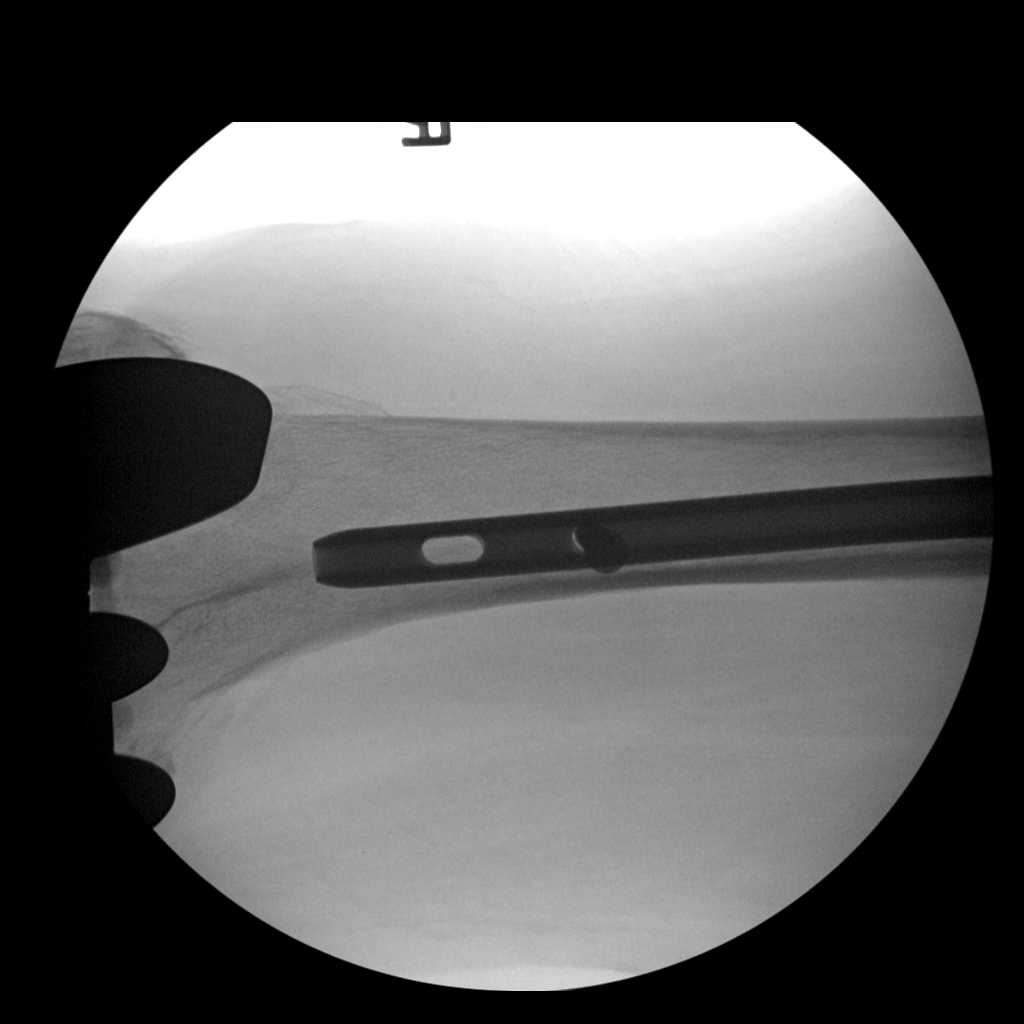
[im 4/4]
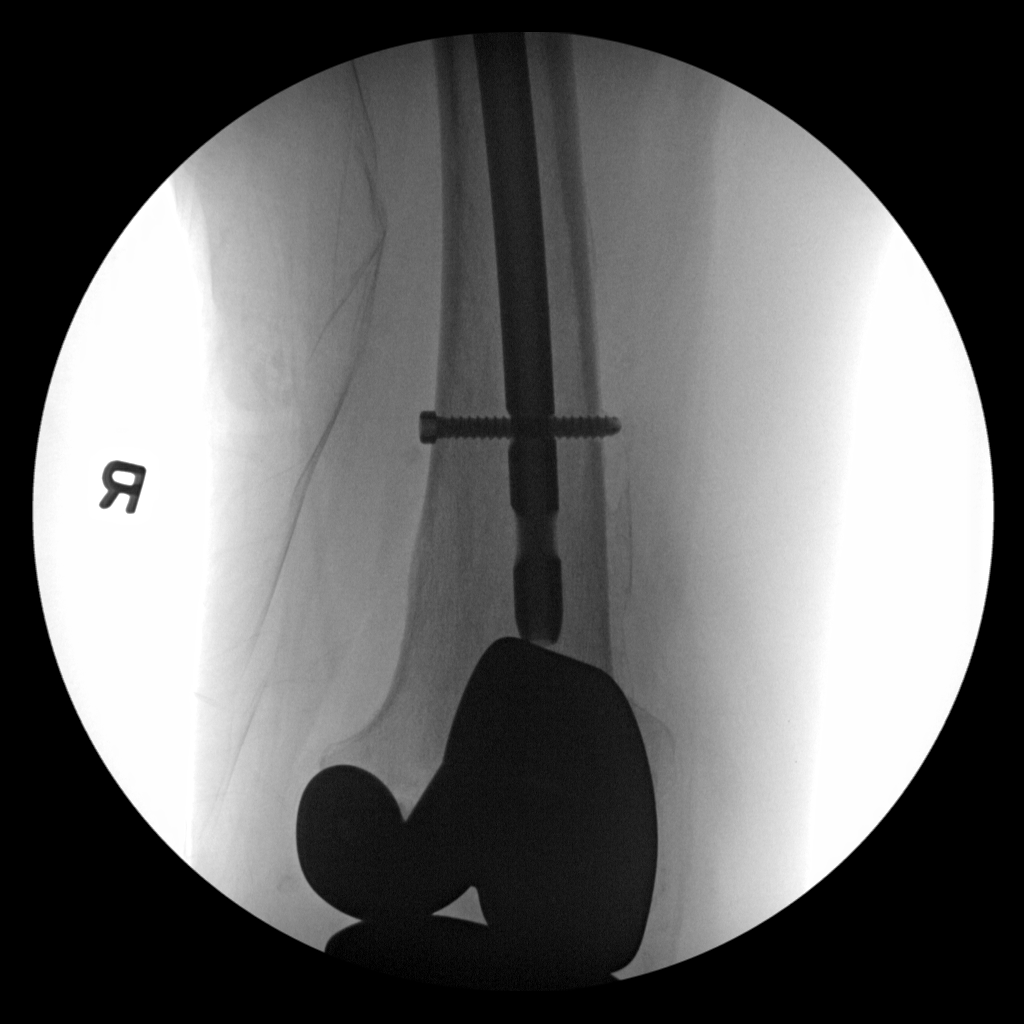

[4 of 4 positions shown; findings below may reference images not displayed]

FINDINGS: Multiple C-arm images show gamma nail placement for fixation of an
intertrochanteric fracture. Components appear well positioned.
Distal locking screw in place. Good position and alignment.
IMPRESSION: Good appearance following ORIF for treatment of right
intertrochanteric fracture.

## 2020-07-27 IMAGING — CT CT OF THE RIGHT KNEE WITHOUT CONTRAST
3 of 5 series · 14 of 34 positions shown, 16 images · non-contrast
Comparison: None.

CLINICAL DATA: The trauma with tibial plateau fracture.

EXAM:
CT OF THE right KNEE WITHOUT CONTRAST
TECHNIQUE: Multidetector CT imaging of the right knee was performed according
to the standard protocol. Multiplanar CT image reconstructions were
also generated.

[Series 5: extremity soft tissue · axial · 0.46mm/px · z∈[-1210,-1008]mm · 8 of 121 slices shown, 10 images]
[im 10/121  soft-tissue]
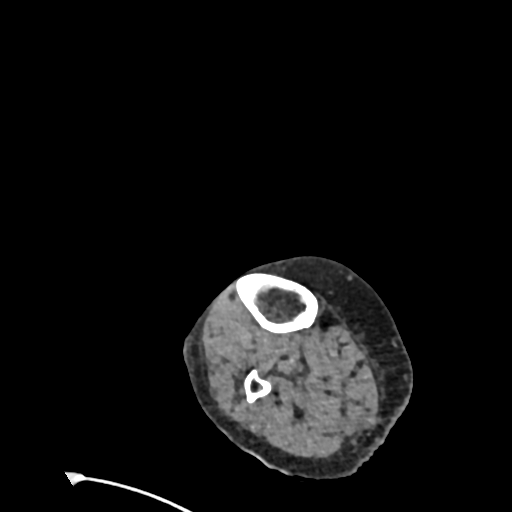
[im 10/121  bone]
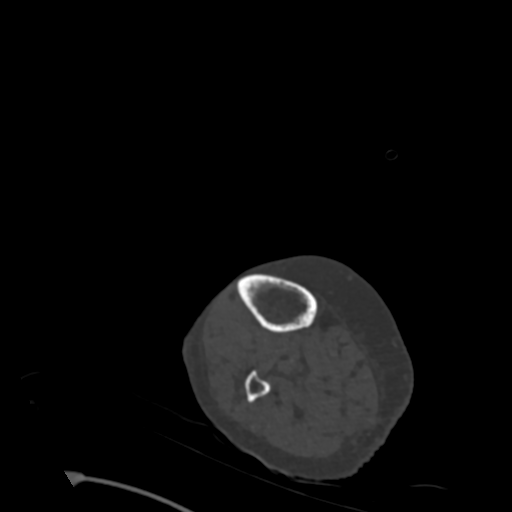
[im 28/121  bone]
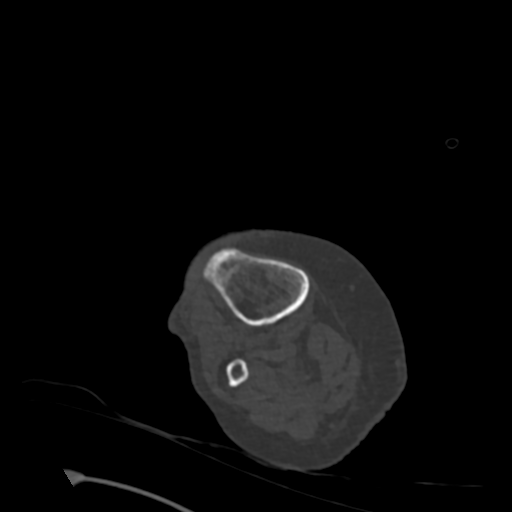
[im 37/121  bone]
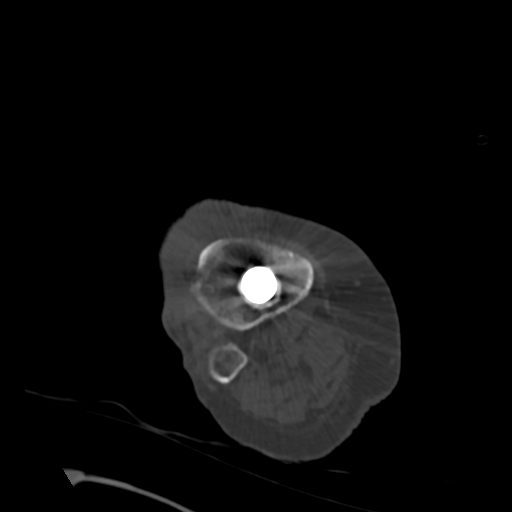
[im 56/121  bone]
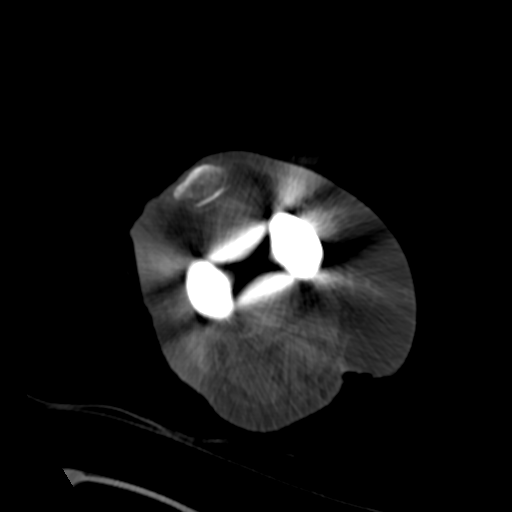
[im 65/121  soft-tissue]
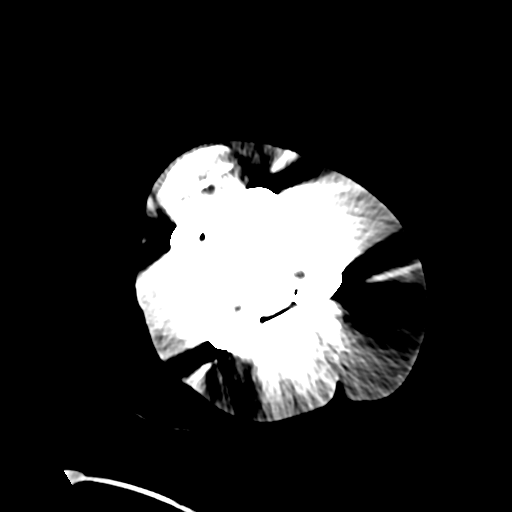
[im 65/121  bone]
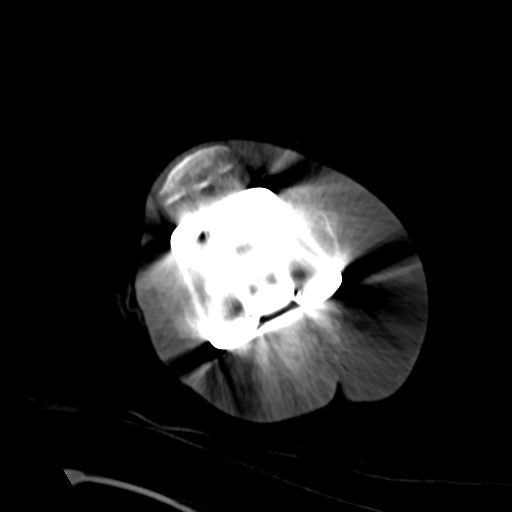
[im 84/121  bone]
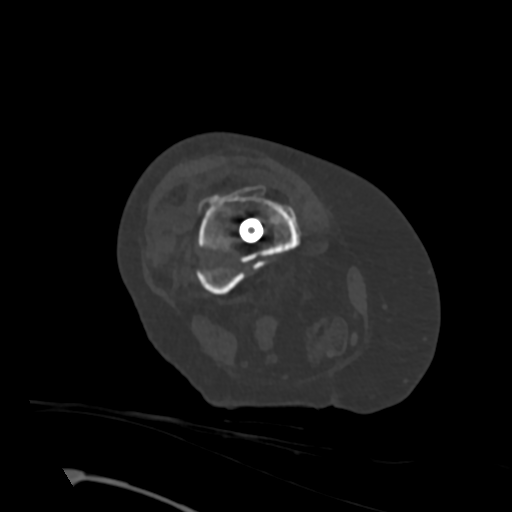
[im 93/121  bone]
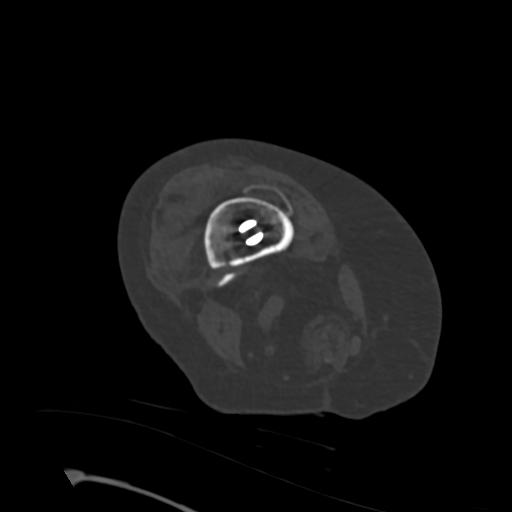
[im 111/121  bone]
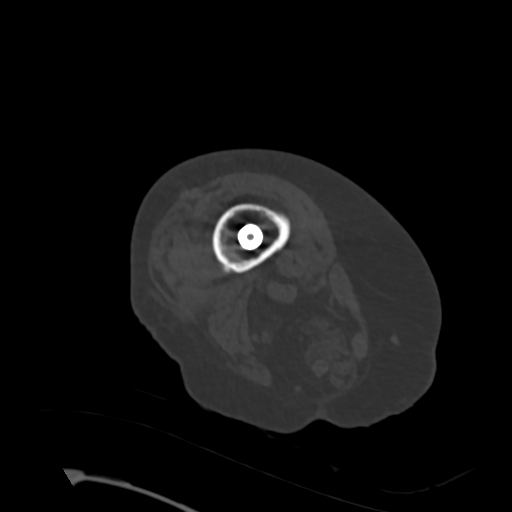

[Series 6: cor bone · coronal · 0.34mm/px · 1 of 79 slices shown]
[im 40/79  bone]
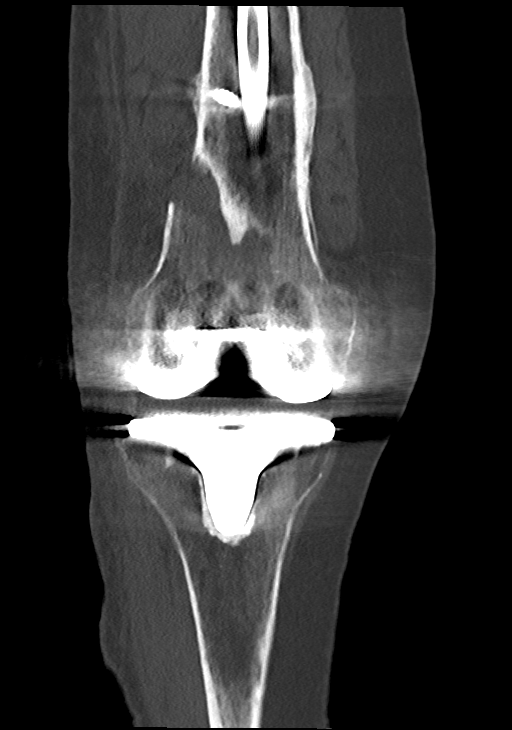

[Series 7: sag bone · sagittal · 0.31mm/px · 5 of 70 slices shown]
[im 14/70  bone]
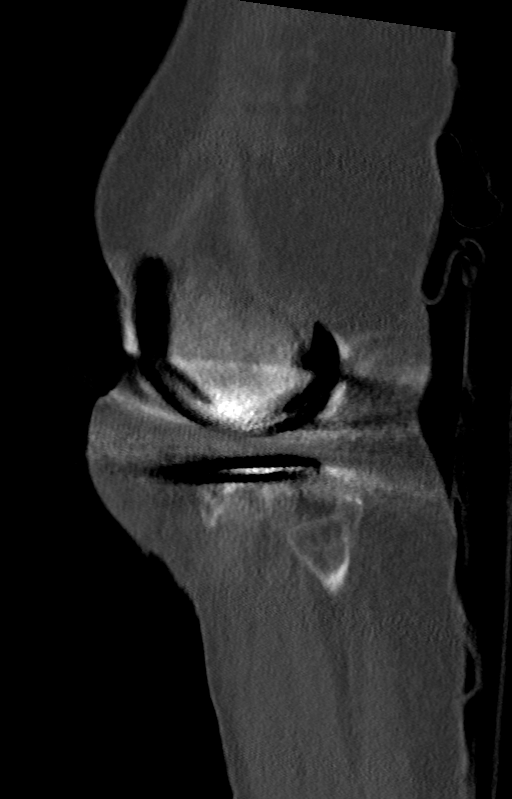
[im 28/70  bone]
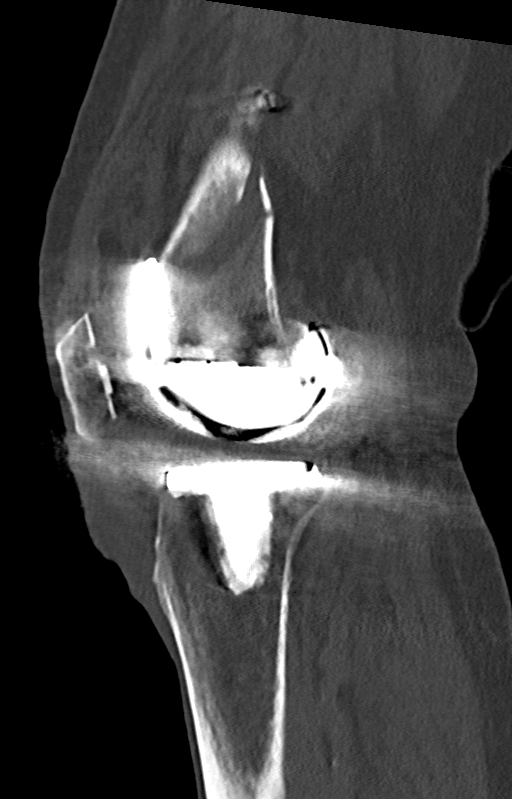
[im 42/70  bone]
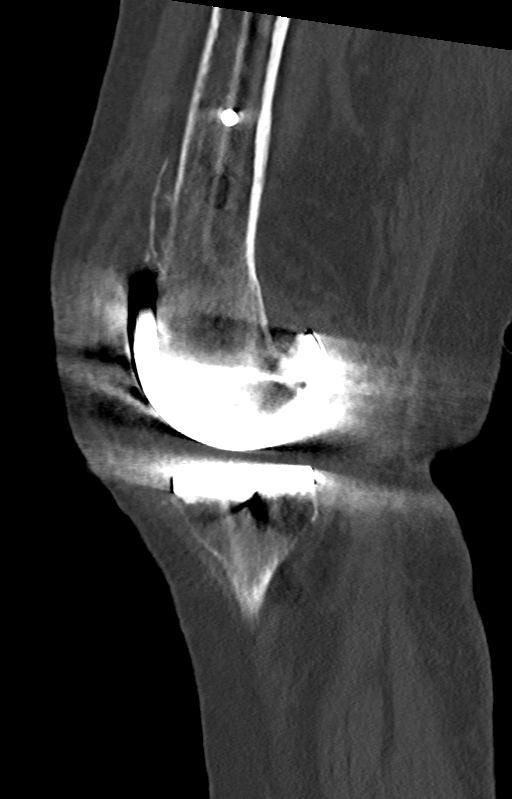
[im 46/70  soft-tissue]
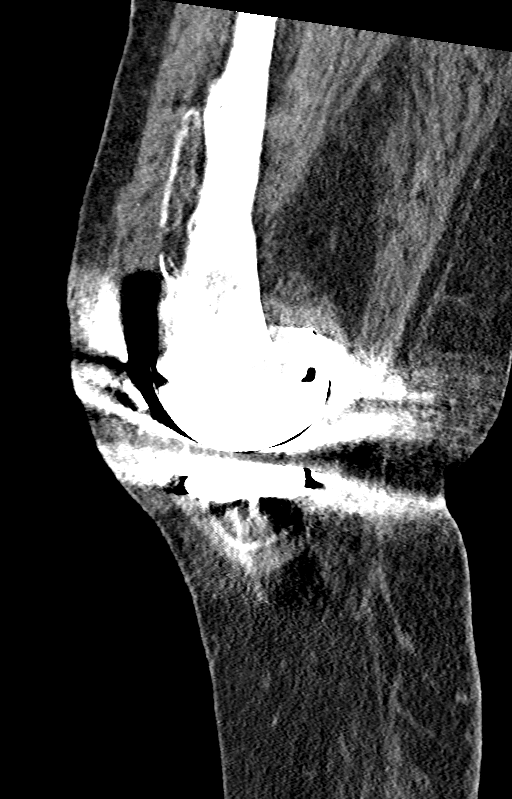
[im 56/70  bone]
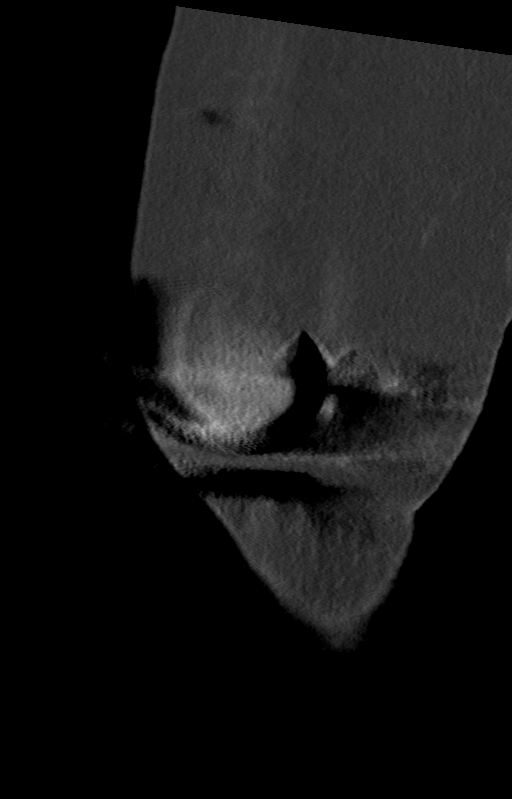

[14 of 34 positions shown; findings below may reference images not displayed]

FINDINGS: Oblique fracture through the supracondylar femur, lateral and
posterior eccentric, interposed between a femoral nail and the
femoral component of a total knee arthroplasty. In addition to the
dominant oblique fracture plane there is a small cortical fracture
extending cranially through the posterior cortex of the distal
femur, seem markings on coronal reformats. No displacement. No
additional fracture noted.

The distal interlocking screw of the femoral nail is fractured,
likely on a chronic basis.

Regional soft tissue swelling with small joint effusion.

Prominent osteopenia.
IMPRESSION: Oblique supracondylar femur fracture interposed between a femoral
nail and total knee arthroplasty.

## 2020-07-28 IMAGING — RF RIGHT FEMUR 2 VIEWS
1 series · 6 of 6 positions shown · non-contrast
Comparison: Radiographs August 10, 2019

CLINICAL DATA: ORIF right distal femur

EXAM:
RIGHT FEMUR 2 VIEWS; DG C-ARM 1-60 MIN

[Series 1: run · 6 of 6 slices shown]
[im 1/6]
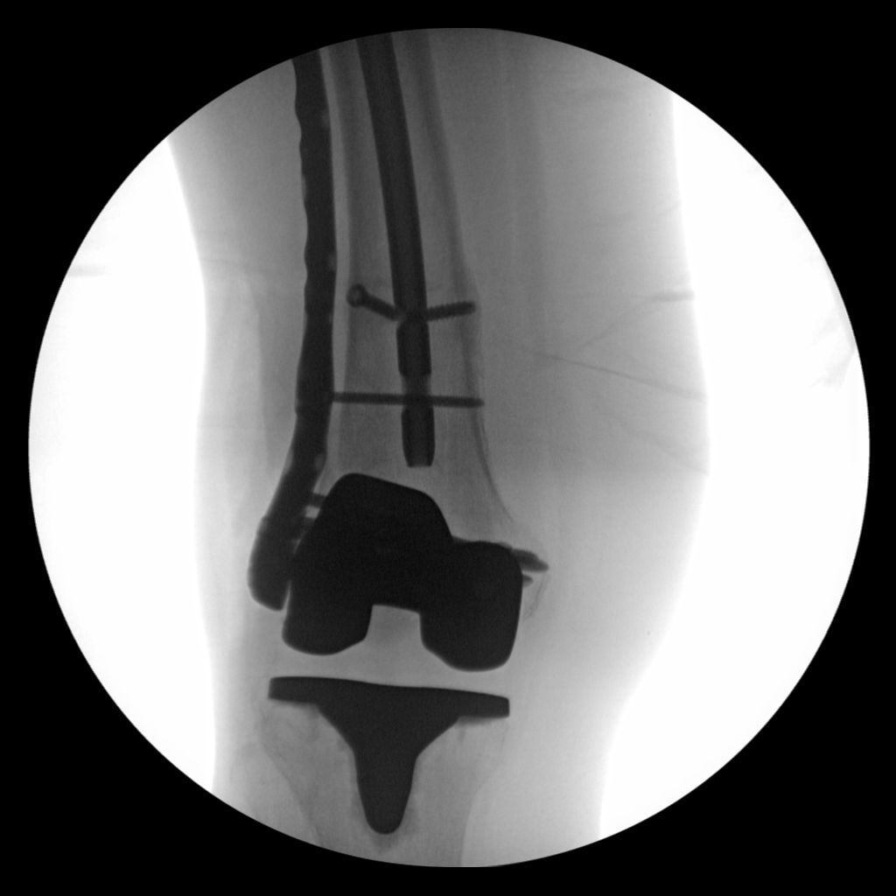
[im 2/6]
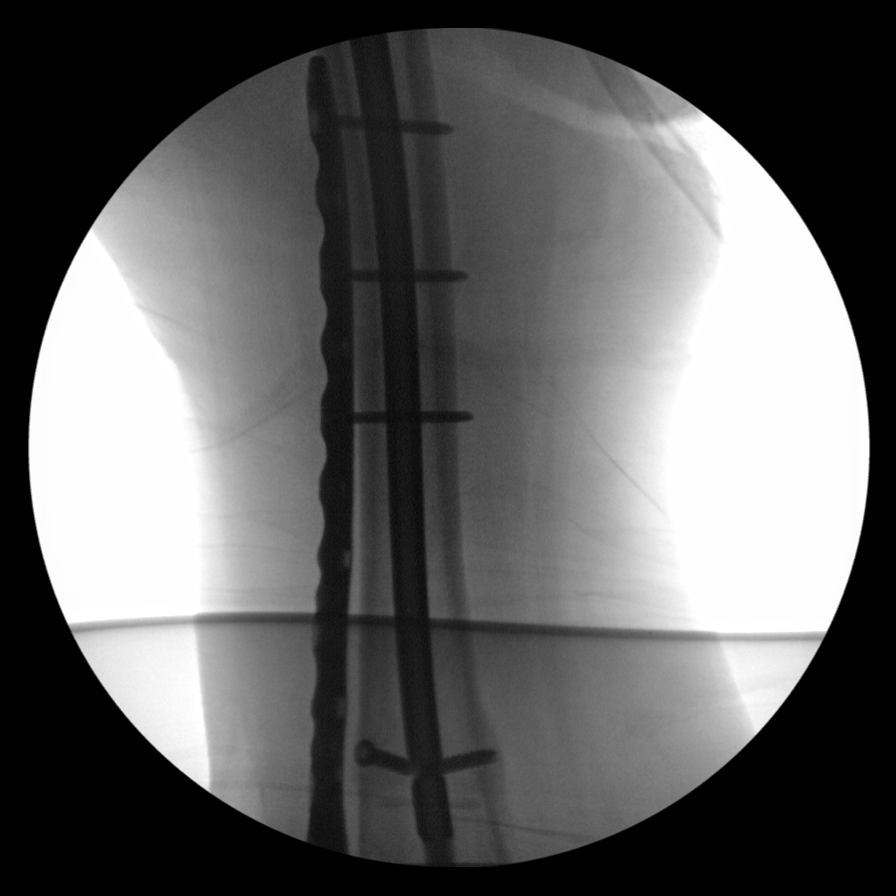
[im 3/6]
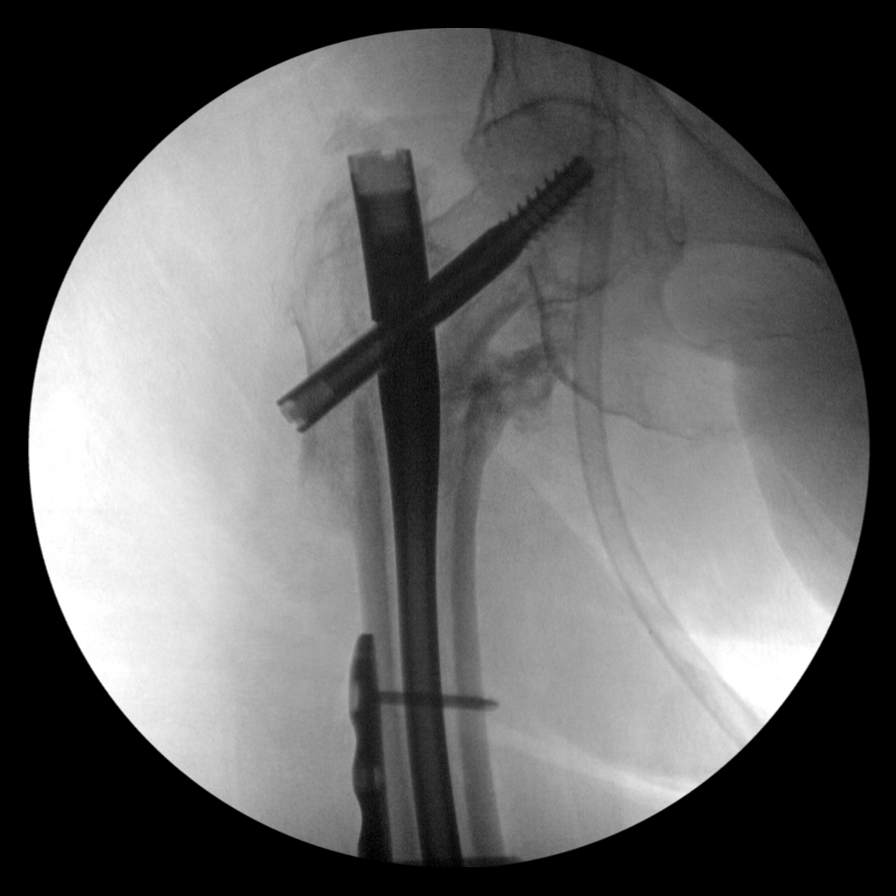
[im 4/6]
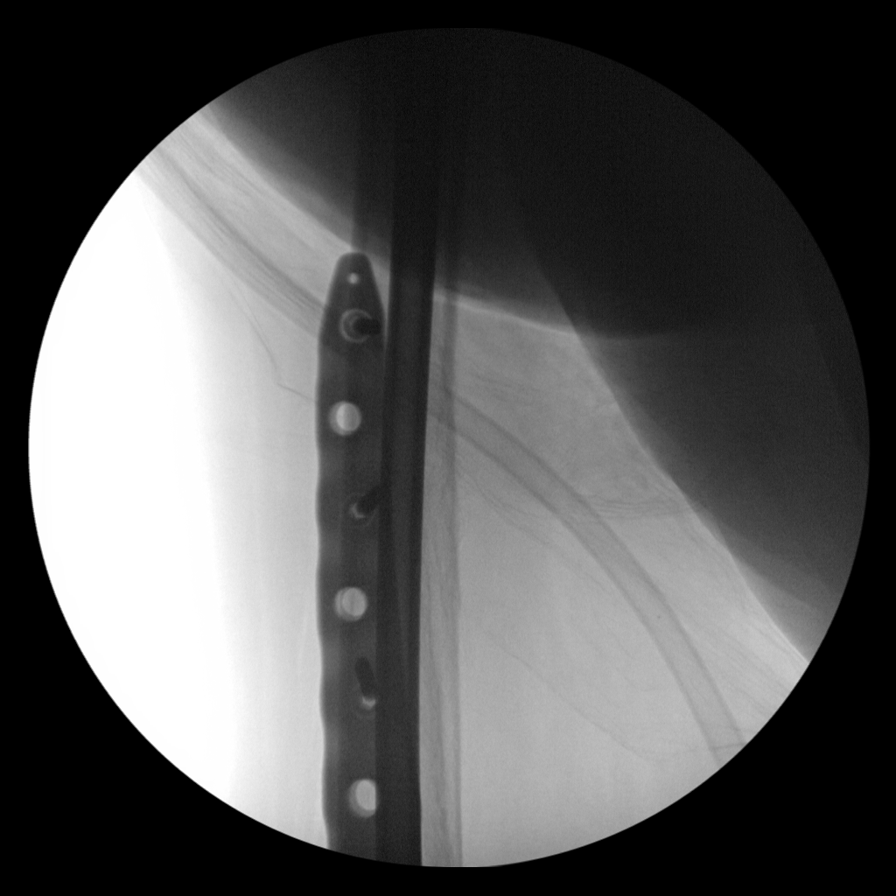
[im 5/6]
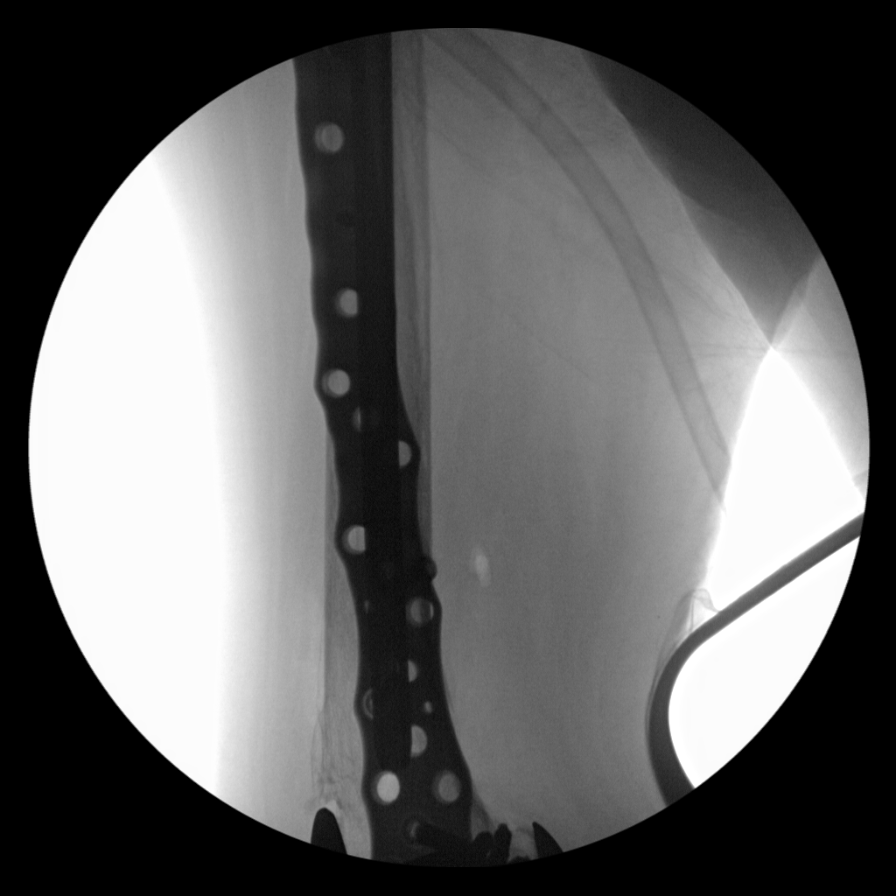
[im 6/6]
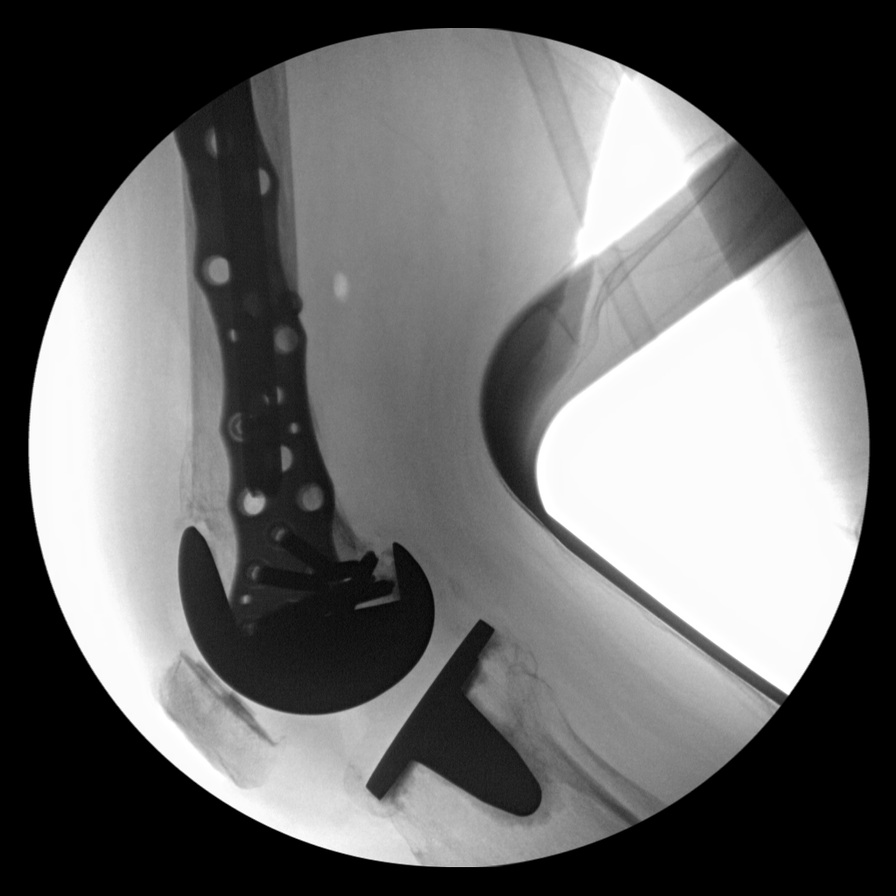

[6 of 6 positions shown; findings below may reference images not displayed]

FINDINGS: Intraoperative fluoroscopy redemonstrates the right femoral
intramedullary rod and transcervical cancellous screw which
transfixes the remote proximal femoral fractures. There is a
fracture of 1 of the distal fixation screws of this intramedullary
rod.

Interval addition of a lateral sideplate and screw fixation
construct which transfixes the distal femoral periprosthetic
fracture seen on radiographs from 1 day prior. Hardware is intact
and engaged. Alignment is near anatomic. Expected postsurgical soft
tissue changes are noted. No unexpected radiopaque foreign body.

Prior total right knee arthroplasty with patellar resurfacing is
again noted.
IMPRESSION: Expected appearance post ORIF the distal right femur.

## 2020-07-28 IMAGING — RF DG C-ARM 1-60 MIN
1 series · 6 of 6 positions shown · non-contrast
Comparison: Radiographs August 10, 2019

CLINICAL DATA: ORIF right distal femur

EXAM:
RIGHT FEMUR 2 VIEWS; DG C-ARM 1-60 MIN

[Series 1: run · 6 of 6 slices shown]
[im 1/6]
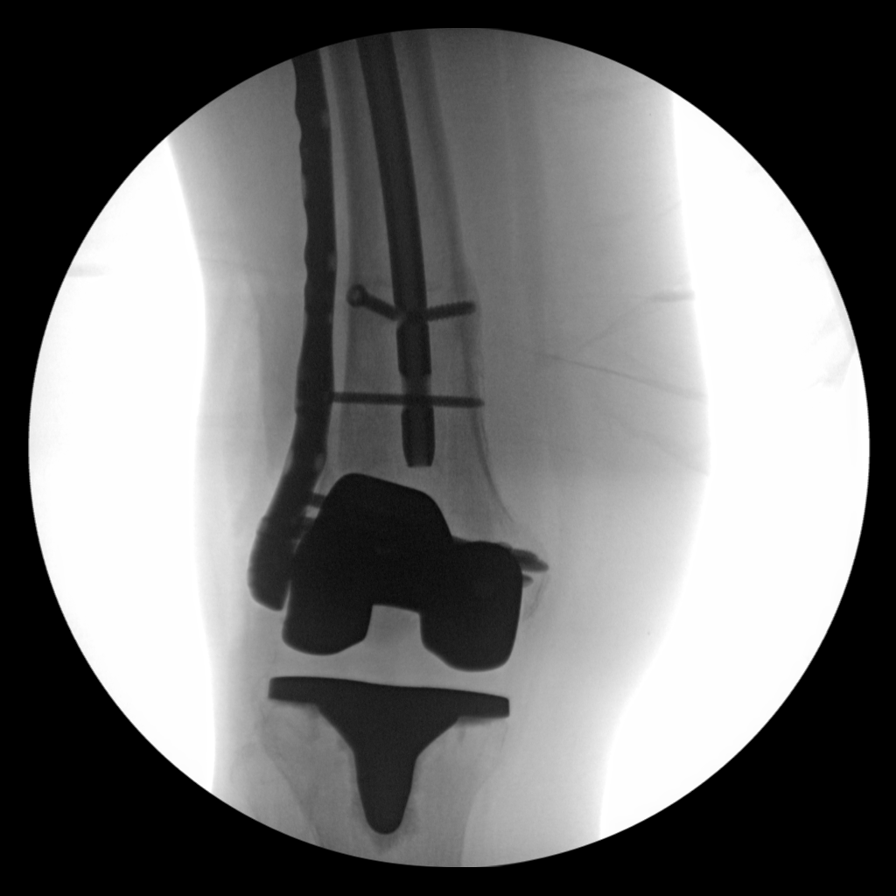
[im 2/6]
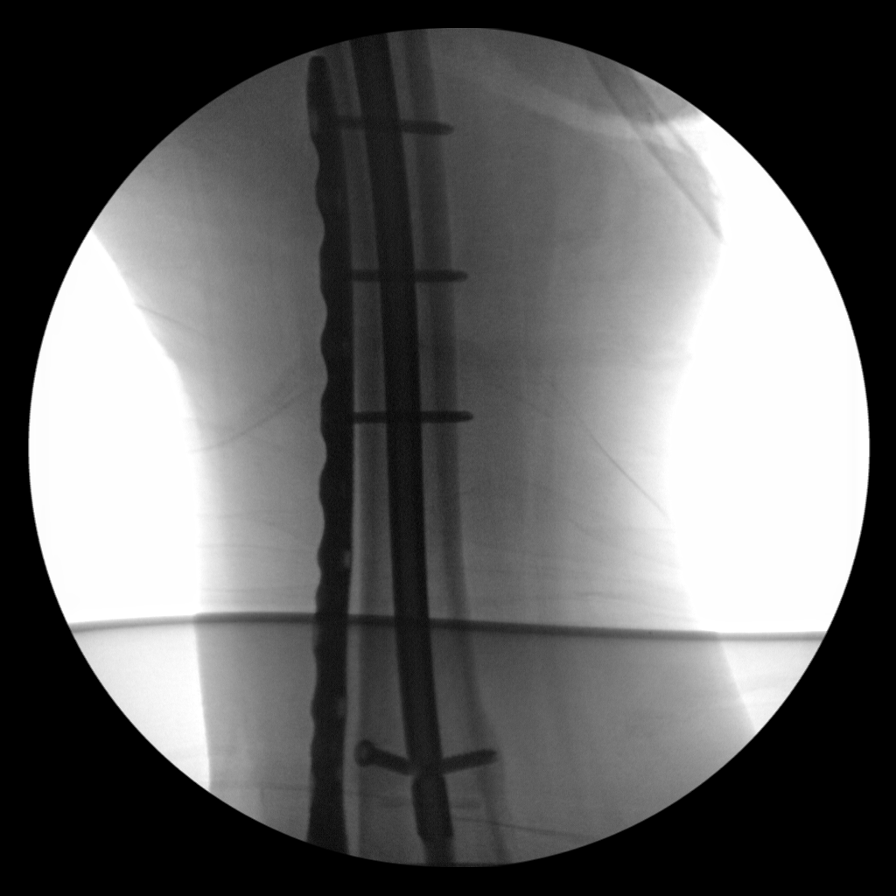
[im 3/6]
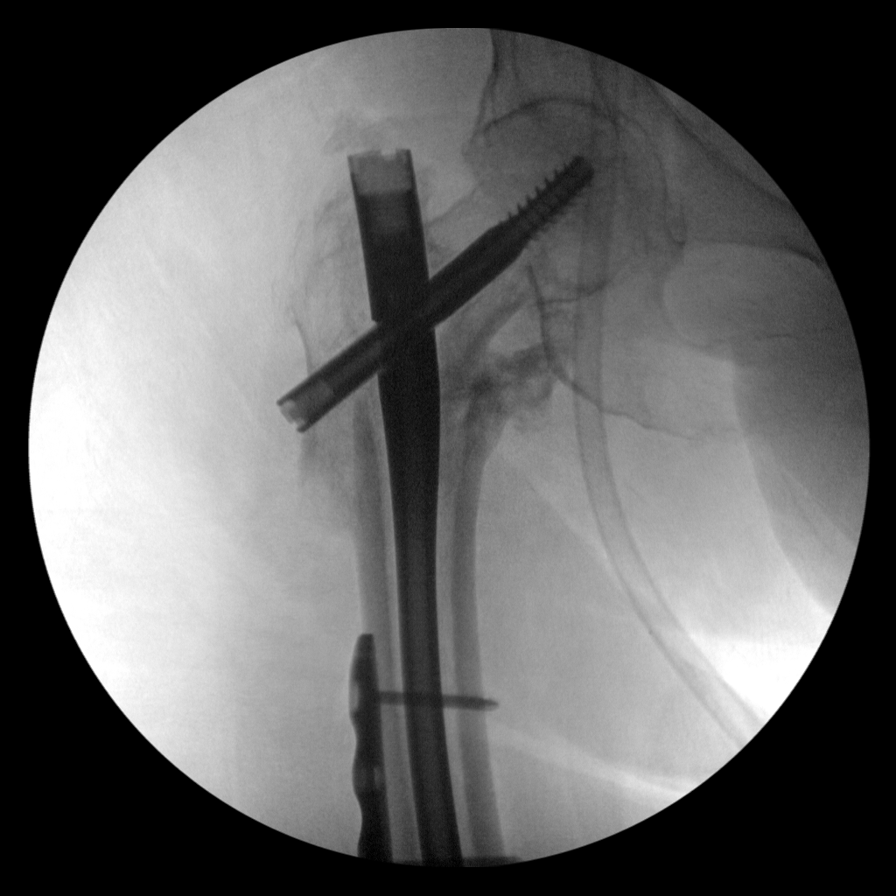
[im 4/6]
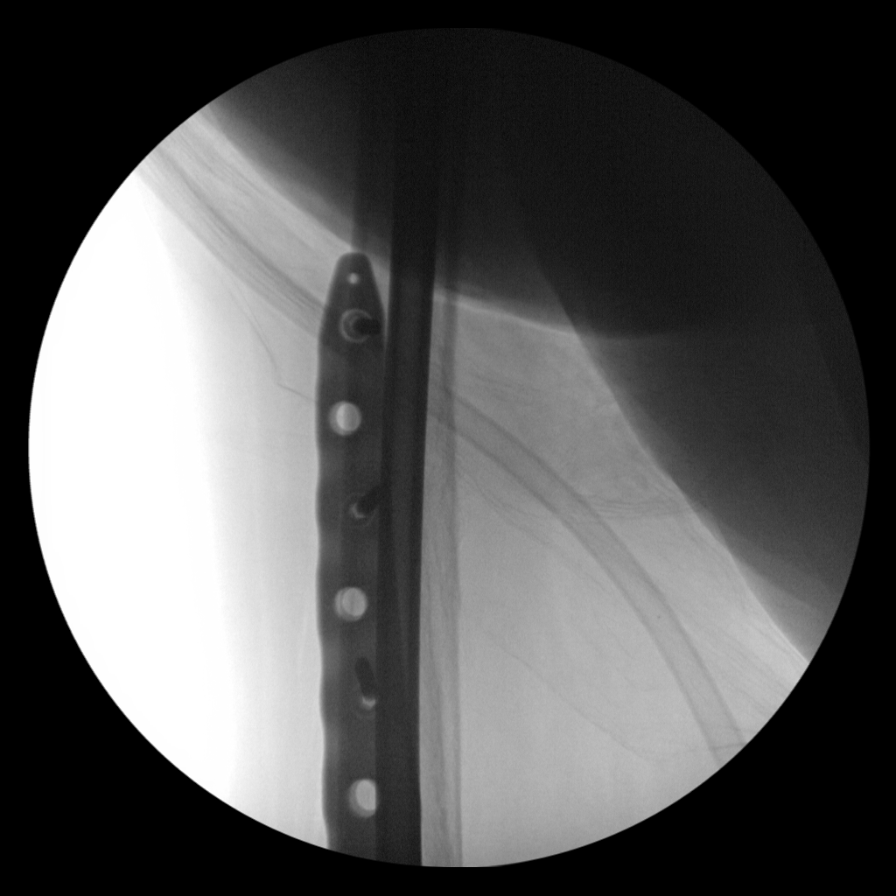
[im 5/6]
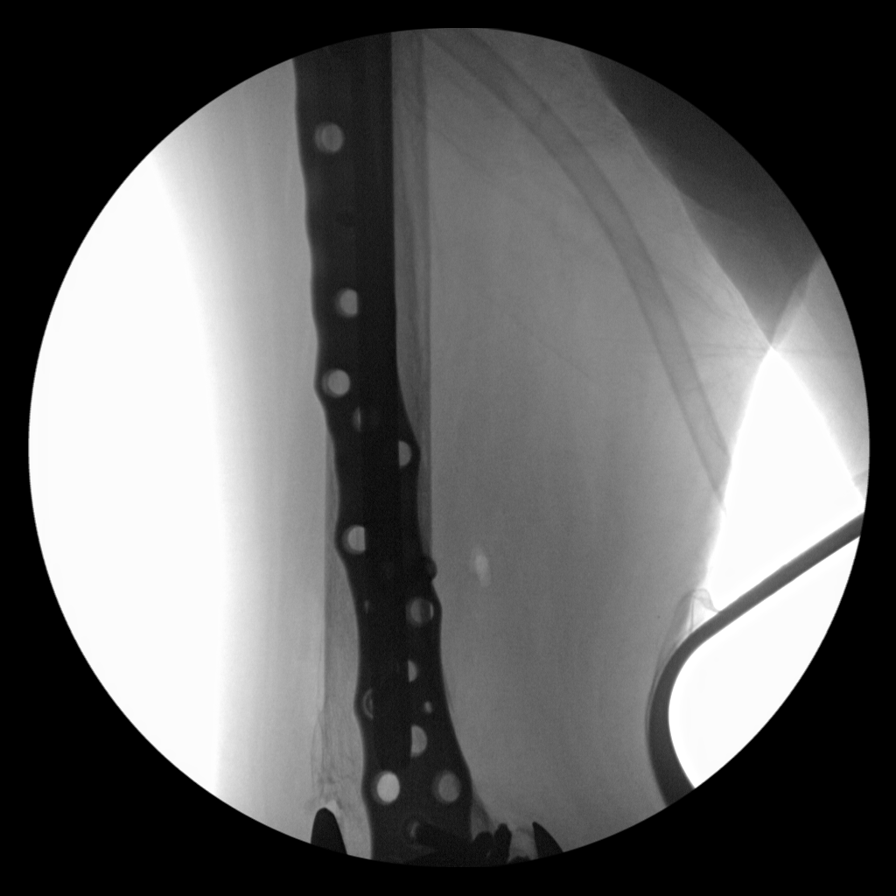
[im 6/6]
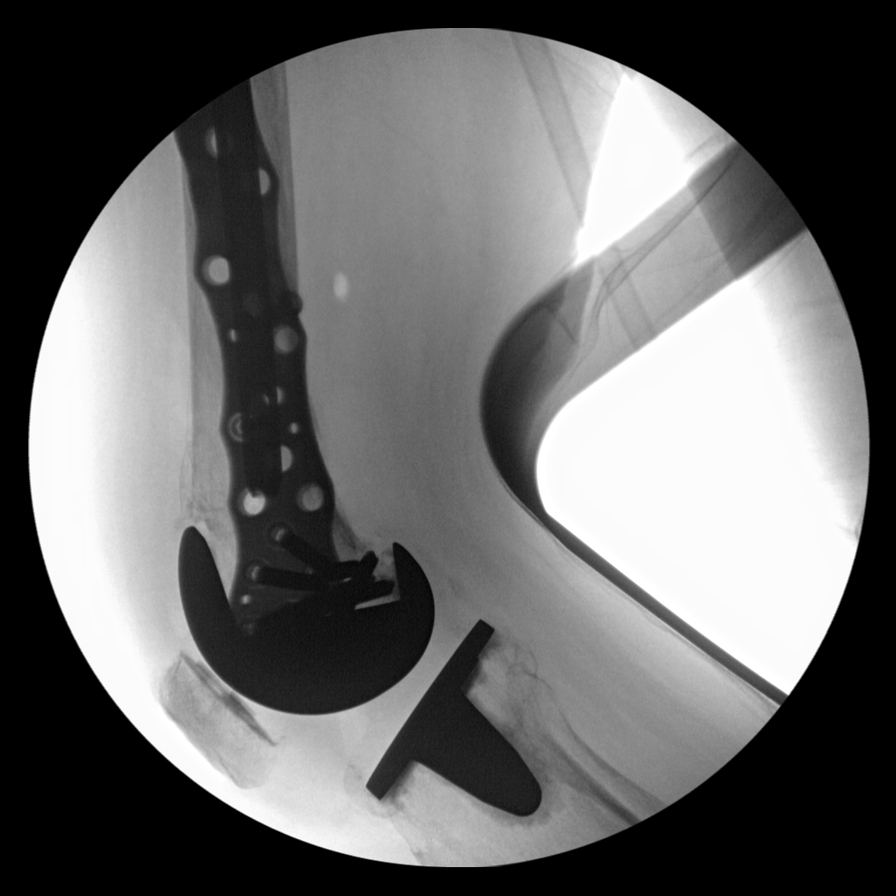

[6 of 6 positions shown; findings below may reference images not displayed]

FINDINGS: Intraoperative fluoroscopy redemonstrates the right femoral
intramedullary rod and transcervical cancellous screw which
transfixes the remote proximal femoral fractures. There is a
fracture of 1 of the distal fixation screws of this intramedullary
rod.

Interval addition of a lateral sideplate and screw fixation
construct which transfixes the distal femoral periprosthetic
fracture seen on radiographs from 1 day prior. Hardware is intact
and engaged. Alignment is near anatomic. Expected postsurgical soft
tissue changes are noted. No unexpected radiopaque foreign body.

Prior total right knee arthroplasty with patellar resurfacing is
again noted.
IMPRESSION: Expected appearance post ORIF the distal right femur.
